# Patient Record
Sex: Female | Born: 1950 | Race: White | Hispanic: No | Smoking: Former smoker
Health system: Southern US, Community
[De-identification: ages and names within clinical notes are randomized; demographics above are authoritative.]

## PROBLEM LIST (undated history)

## (undated) DIAGNOSIS — F411 Generalized anxiety disorder: Secondary | ICD-10-CM

## (undated) DIAGNOSIS — H9190 Unspecified hearing loss, unspecified ear: Secondary | ICD-10-CM

## (undated) DIAGNOSIS — K3184 Gastroparesis: Secondary | ICD-10-CM

## (undated) DIAGNOSIS — M810 Age-related osteoporosis without current pathological fracture: Secondary | ICD-10-CM

## (undated) DIAGNOSIS — K219 Gastro-esophageal reflux disease without esophagitis: Secondary | ICD-10-CM

## (undated) DIAGNOSIS — I4891 Unspecified atrial fibrillation: Secondary | ICD-10-CM

## (undated) DIAGNOSIS — I498 Other specified cardiac arrhythmias: Secondary | ICD-10-CM

## (undated) DIAGNOSIS — I499 Cardiac arrhythmia, unspecified: Secondary | ICD-10-CM

## (undated) DIAGNOSIS — F32A Depression, unspecified: Secondary | ICD-10-CM

## (undated) HISTORY — DX: Unspecified atrial fibrillation: I48.91

## (undated) HISTORY — DX: Gastroparesis: K31.84

## (undated) HISTORY — DX: Depression, unspecified: F32.A

## (undated) HISTORY — DX: Generalized anxiety disorder: F41.1

## (undated) HISTORY — DX: Age-related osteoporosis without current pathological fracture: M81.0

## (undated) HISTORY — DX: Gastro-esophageal reflux disease without esophagitis: K21.9

## (undated) HISTORY — DX: Cardiac arrhythmia, unspecified: I49.9

## (undated) HISTORY — DX: Unspecified hearing loss, unspecified ear: H91.90

## (undated) HISTORY — PX: EYE SURGERY: SHX253

## (undated) HISTORY — PX: TONSILLECTOMY: SUR1361

---

## 1998-04-13 ENCOUNTER — Other Ambulatory Visit: Admission: RE | Admit: 1998-04-13 | Discharge: 1998-04-13 | Payer: Self-pay | Admitting: *Deleted

## 1999-03-24 ENCOUNTER — Other Ambulatory Visit: Admission: RE | Admit: 1999-03-24 | Discharge: 1999-03-24 | Payer: Self-pay | Admitting: Obstetrics & Gynecology

## 1999-11-24 ENCOUNTER — Encounter: Admission: RE | Admit: 1999-11-24 | Discharge: 1999-11-24 | Payer: Self-pay | Admitting: Obstetrics & Gynecology

## 1999-11-24 ENCOUNTER — Encounter: Payer: Self-pay | Admitting: Obstetrics & Gynecology

## 1999-12-03 ENCOUNTER — Encounter: Admission: RE | Admit: 1999-12-03 | Discharge: 1999-12-03 | Payer: Self-pay | Admitting: Obstetrics & Gynecology

## 1999-12-03 ENCOUNTER — Encounter: Payer: Self-pay | Admitting: Obstetrics & Gynecology

## 2000-03-20 ENCOUNTER — Other Ambulatory Visit: Admission: RE | Admit: 2000-03-20 | Discharge: 2000-03-20 | Payer: Self-pay | Admitting: Obstetrics & Gynecology

## 2000-12-05 ENCOUNTER — Encounter: Payer: Self-pay | Admitting: Obstetrics & Gynecology

## 2000-12-05 ENCOUNTER — Encounter: Admission: RE | Admit: 2000-12-05 | Discharge: 2000-12-05 | Payer: Self-pay | Admitting: Obstetrics & Gynecology

## 2001-04-24 ENCOUNTER — Other Ambulatory Visit: Admission: RE | Admit: 2001-04-24 | Discharge: 2001-04-24 | Payer: Self-pay | Admitting: Obstetrics & Gynecology

## 2001-11-14 ENCOUNTER — Encounter: Admission: RE | Admit: 2001-11-14 | Discharge: 2001-11-14 | Payer: Self-pay | Admitting: Obstetrics & Gynecology

## 2001-11-14 ENCOUNTER — Encounter: Payer: Self-pay | Admitting: Obstetrics & Gynecology

## 2002-05-08 ENCOUNTER — Ambulatory Visit (HOSPITAL_COMMUNITY): Admission: RE | Admit: 2002-05-08 | Discharge: 2002-05-08 | Payer: Self-pay | Admitting: Internal Medicine

## 2002-05-20 ENCOUNTER — Other Ambulatory Visit: Admission: RE | Admit: 2002-05-20 | Discharge: 2002-05-20 | Payer: Self-pay | Admitting: Obstetrics & Gynecology

## 2002-11-15 ENCOUNTER — Encounter: Admission: RE | Admit: 2002-11-15 | Discharge: 2002-11-15 | Payer: Self-pay | Admitting: Obstetrics & Gynecology

## 2002-11-15 ENCOUNTER — Encounter: Payer: Self-pay | Admitting: Obstetrics & Gynecology

## 2003-05-21 ENCOUNTER — Other Ambulatory Visit: Admission: RE | Admit: 2003-05-21 | Discharge: 2003-05-21 | Payer: Self-pay | Admitting: Obstetrics & Gynecology

## 2003-11-28 ENCOUNTER — Encounter: Admission: RE | Admit: 2003-11-28 | Discharge: 2003-11-28 | Payer: Self-pay | Admitting: Obstetrics & Gynecology

## 2004-06-28 ENCOUNTER — Other Ambulatory Visit: Admission: RE | Admit: 2004-06-28 | Discharge: 2004-06-28 | Payer: Self-pay | Admitting: Obstetrics & Gynecology

## 2004-11-03 ENCOUNTER — Ambulatory Visit (HOSPITAL_COMMUNITY): Admission: RE | Admit: 2004-11-03 | Discharge: 2004-11-03 | Payer: Self-pay | Admitting: Family Medicine

## 2004-11-29 ENCOUNTER — Ambulatory Visit (HOSPITAL_COMMUNITY): Admission: RE | Admit: 2004-11-29 | Discharge: 2004-11-29 | Payer: Self-pay | Admitting: Obstetrics & Gynecology

## 2005-07-27 ENCOUNTER — Other Ambulatory Visit: Admission: RE | Admit: 2005-07-27 | Discharge: 2005-07-27 | Payer: Self-pay | Admitting: Obstetrics & Gynecology

## 2005-12-01 ENCOUNTER — Ambulatory Visit (HOSPITAL_COMMUNITY): Admission: RE | Admit: 2005-12-01 | Discharge: 2005-12-01 | Payer: Self-pay | Admitting: Obstetrics & Gynecology

## 2005-12-21 ENCOUNTER — Ambulatory Visit: Payer: Self-pay | Admitting: Internal Medicine

## 2006-02-02 ENCOUNTER — Ambulatory Visit: Payer: Self-pay | Admitting: Internal Medicine

## 2006-03-09 ENCOUNTER — Ambulatory Visit: Payer: Self-pay | Admitting: Internal Medicine

## 2006-03-09 ENCOUNTER — Ambulatory Visit (HOSPITAL_COMMUNITY): Admission: RE | Admit: 2006-03-09 | Discharge: 2006-03-09 | Payer: Self-pay | Admitting: Internal Medicine

## 2006-12-04 ENCOUNTER — Ambulatory Visit (HOSPITAL_COMMUNITY): Admission: RE | Admit: 2006-12-04 | Discharge: 2006-12-04 | Payer: Self-pay | Admitting: Obstetrics & Gynecology

## 2007-03-22 ENCOUNTER — Ambulatory Visit: Payer: Self-pay | Admitting: Gastroenterology

## 2007-03-23 ENCOUNTER — Encounter (HOSPITAL_COMMUNITY): Admission: RE | Admit: 2007-03-23 | Discharge: 2007-04-22 | Payer: Self-pay | Admitting: Gastroenterology

## 2007-04-19 DIAGNOSIS — K3184 Gastroparesis: Secondary | ICD-10-CM

## 2007-04-19 HISTORY — DX: Gastroparesis: K31.84

## 2007-04-24 ENCOUNTER — Ambulatory Visit: Payer: Self-pay | Admitting: Gastroenterology

## 2007-05-25 ENCOUNTER — Ambulatory Visit: Payer: Self-pay | Admitting: Gastroenterology

## 2007-10-17 ENCOUNTER — Ambulatory Visit: Payer: Self-pay | Admitting: Gastroenterology

## 2007-12-04 ENCOUNTER — Ambulatory Visit: Payer: Self-pay | Admitting: Gastroenterology

## 2007-12-06 ENCOUNTER — Ambulatory Visit (HOSPITAL_COMMUNITY): Admission: RE | Admit: 2007-12-06 | Discharge: 2007-12-06 | Payer: Self-pay | Admitting: Obstetrics & Gynecology

## 2008-01-15 ENCOUNTER — Ambulatory Visit: Payer: Self-pay | Admitting: Gastroenterology

## 2008-04-01 ENCOUNTER — Ambulatory Visit: Payer: Self-pay | Admitting: Gastroenterology

## 2008-07-29 ENCOUNTER — Ambulatory Visit: Payer: Self-pay | Admitting: Gastroenterology

## 2008-12-08 ENCOUNTER — Ambulatory Visit (HOSPITAL_COMMUNITY): Admission: RE | Admit: 2008-12-08 | Discharge: 2008-12-08 | Payer: Self-pay | Admitting: Obstetrics and Gynecology

## 2009-03-31 DIAGNOSIS — Z8659 Personal history of other mental and behavioral disorders: Secondary | ICD-10-CM | POA: Insufficient documentation

## 2009-03-31 DIAGNOSIS — R12 Heartburn: Secondary | ICD-10-CM | POA: Insufficient documentation

## 2009-03-31 DIAGNOSIS — K117 Disturbances of salivary secretion: Secondary | ICD-10-CM | POA: Insufficient documentation

## 2009-03-31 DIAGNOSIS — K5900 Constipation, unspecified: Secondary | ICD-10-CM | POA: Insufficient documentation

## 2009-03-31 DIAGNOSIS — R1319 Other dysphagia: Secondary | ICD-10-CM | POA: Insufficient documentation

## 2009-03-31 DIAGNOSIS — J029 Acute pharyngitis, unspecified: Secondary | ICD-10-CM | POA: Insufficient documentation

## 2009-03-31 DIAGNOSIS — K219 Gastro-esophageal reflux disease without esophagitis: Secondary | ICD-10-CM | POA: Insufficient documentation

## 2009-03-31 DIAGNOSIS — R110 Nausea: Secondary | ICD-10-CM | POA: Insufficient documentation

## 2009-03-31 DIAGNOSIS — R1013 Epigastric pain: Secondary | ICD-10-CM | POA: Insufficient documentation

## 2009-03-31 DIAGNOSIS — K3184 Gastroparesis: Secondary | ICD-10-CM | POA: Insufficient documentation

## 2009-04-01 ENCOUNTER — Ambulatory Visit: Payer: Self-pay | Admitting: Gastroenterology

## 2009-12-14 ENCOUNTER — Ambulatory Visit (HOSPITAL_COMMUNITY): Admission: RE | Admit: 2009-12-14 | Discharge: 2009-12-14 | Payer: Self-pay | Admitting: Obstetrics and Gynecology

## 2010-03-05 ENCOUNTER — Encounter (INDEPENDENT_AMBULATORY_CARE_PROVIDER_SITE_OTHER): Payer: Self-pay | Admitting: *Deleted

## 2010-12-16 ENCOUNTER — Ambulatory Visit (HOSPITAL_COMMUNITY)
Admission: RE | Admit: 2010-12-16 | Discharge: 2010-12-16 | Payer: Self-pay | Source: Home / Self Care | Attending: Obstetrics and Gynecology | Admitting: Obstetrics and Gynecology

## 2011-01-20 NOTE — Letter (Signed)
Summary: [facility] Appointment Letter  [facility]  [address]   [place], [place] [address]   Phone: [phone]  Fax: [phone]    03/05/2010  Pamela Yang [address] [place], [place]  [address] 03-Oct-1951  Dear [name],   Your physician has indicated that:   _______it is time to schedule an appointment.   _______you missed your appointment on______ and need to call and  reschedule.   _______you need to have lab work done.   _______you need to schedule an appointment to discuss lab or test results.   _______you need to call to reschedule your appointment that was scheduled on _________.   Please call our office at  [phone].    Thank you,    [facility] Ph: [phone]   Fax: [phone]

## 2011-03-30 ENCOUNTER — Other Ambulatory Visit (HOSPITAL_COMMUNITY): Payer: Self-pay | Admitting: Family Medicine

## 2011-03-30 ENCOUNTER — Ambulatory Visit (HOSPITAL_COMMUNITY)
Admission: RE | Admit: 2011-03-30 | Discharge: 2011-03-30 | Disposition: A | Discharge status: Home / Self Care | Payer: BC Managed Care – PPO | Source: Ambulatory Visit | Attending: Family Medicine | Admitting: Family Medicine

## 2011-03-30 DIAGNOSIS — R079 Chest pain, unspecified: Secondary | ICD-10-CM | POA: Insufficient documentation

## 2011-04-08 ENCOUNTER — Ambulatory Visit (HOSPITAL_COMMUNITY): Payer: BC Managed Care – PPO | Attending: Family Medicine

## 2011-04-08 DIAGNOSIS — R079 Chest pain, unspecified: Secondary | ICD-10-CM | POA: Insufficient documentation

## 2011-05-03 NOTE — Assessment & Plan Note (Signed)
NAME:  Pamela Yang, Pamela Yang               CHART#:  [id number]   DATE:                                   DOB:  1951-04-29   REFERRING PHYSICIAN:  Scott A. Gerda Diss, MD.   PROBLEM LIST:  1. Gastroparesis.  2. Irritable bowel syndrome.  3. Screening colonoscopy in May 2003.  4. Occasional alcohol use.  5. History of tobacco use but quit five years ago.   SUBJECTIVE:  Pamela Yang is a 60 year old female, who is seen as a  return patient visit.  She states she is doing pretty good.  She cannot  handle lettuce.  She would like longer prescriptions of Reglan.  She  also complains of constipation, but she has been eating more fiber.  She  has tried MiraLax daily, but it caused bloating.  She is now taking it  once every two to three days.  She occasionally feels acid in the back  of her throat especially if she has some dietary indiscretions.   MEDICATIONS:  1. Aspirin 81 mg daily.  2. Multivitamin.  3. Prilosec 20 mg daily.  4. Reglan 5 mg three times a day.   OBJECTIVE:  Vital signs:  Weight 114 pounds (up 3 pounds since her last  visit), height 5 foot 4 inches, temperature 97.5, blood pressure 120/82.  General:  She is in no apparent distress, alert and oriented x4.  Lungs:  Clear to auscultation bilaterally.Cardiovascular:  Regular rhythm, no  murmur, normal S1 and S2.  Abdomen:  Bowel sounds are present, soft, nontender, and nondistended,  no rebound or guarding.   ASSESSMENT:  Pamela Yang is a 60 year old female, who was diagnosed  with a gastroparesis.  On her last visit, she was started on Reglan.  She is not experiencing any signs or symptoms of side effects from the  medication.  Thank you for allowing me to see Pamela Yang in  consultation.  My recommendations follow.   RECOMMENDATIONS:  1. Pamela Yang seems to be doing better with the liberalization of      her diet.  She should again challenge her stomach with different      substances.  She will be able to have a more  liberal diet through      trial and error.  2. I have given her a prescription for Reglan, one month supply,      signature one p.o. q.a.c.  She is given 11 refills.  3. She may follow up and see me in four months.  If she is feeling      well, she may call and cancel that appointment and reschedule.       Kassie Mends, M.D.  Electronically Signed     SM/MEDQ  D:  05/25/2007  T:  05/25/2007  Job:  [id number]   cc:   Lorin Picket A. Gerda Diss, MD

## 2011-05-03 NOTE — Assessment & Plan Note (Signed)
NAME:  Pamela Yang, Pamela Yang               CHART#:  [id number]   DATE:  10/17/2007                       DOB:  [birth year]   REFERRING PHYSICIAN:  Lilyan Punt.   PROBLEM LIST:  1. Gastroparesis.  2. Irritable bowel syndrome.  3. Screening colonoscopy in May 2003.  4. Occasional alcohol use.  5. History of tobacco abuse but quit 5 years ago.   SUBJECTIVE:  Ms. Pamela Yang is a 60 year old female who presents as a  return patient visit.  She reports that taking Reglan caused her to feel  very depressed and to have suicidal thoughts.  She stopped taking the  medicine all together and now takes it as needed.  She complains of  discomfort in the back of her throat at times.  She has burping.  She  complains of a sharp, annoying pain that comes and goes underneath her  ribcage.  She never feels like anything is quite right.  She does have  good days but she also has bad days.  No particular foods seem to make  her symptoms worse.  She can sometimes tolerate oatmeal and sometimes  not.  The discomfort under her ribcage is sometimes associated with  chest discomfort.  It sometimes correlates with the discomfort in the  back of her throat and sometimes not.  She denies any difficulty  swallowing.  Rarely does she have nausea.  She has no vomiting.  She has  a bowel movement every 2 days.  She rarely takes the MiraLax and she is  not taking any fiber supplements.   MEDICATIONS:  1. Aspirin.  2. Multivitamin.  3. Calcium with vitamin D due to osteopenia.  4. Prilosec 20 mg daily 30 minutes before meals.  5. Vitamin C.  6. Iron.  7. Magnesium.  8. Flax seed.  9. Reglan as needed.   OBJECTIVE:  Weight 115 pounds (up 1 pound since June of 2008).  Height 5  feet 4 inches.  Body mass index 19.7 (slightly underweight).  Temperature 97.8.  Blood pressure 104/76.  Pulse 70.  GENERAL:  She is in no apparent distress.  Alert and oriented x4.  HEENT:  Atraumatic, normocephalic.  Pupils equal and  reactive to light.  Mouth, no oral lesions.  Posterior pharynx without erythema or exudate.  NECK:  Full range of motion.  No lymphadenopathy.  LUNGS:  Clear to auscultation bilaterally.  CARDIOVASCULAR:  Regular rhythm. No murmur.  Normal S1 and S2.  ABDOMEN:  Bowel sounds are present.  Soft, nontender, and nondistended.  NEURO:  She has no focal neurologic deficits.   ASSESSMENT:  Ms. Pamela Yang is a 60 year old female who has a history of  depression and her symptoms of depression were exacerbated by Reglan.  She also complains of symptoms which sound like a combination of  partially controlled gastroesophageal reflux disease and perhaps  irritable bowel.  Thank you for allowing me to see Ms. Pamela Yang in  consultation.  My recommendations follow.   RECOMMENDATIONS:  1. Ms. Pamela Yang is instructed to stop her Reglan.  I do not think she      is an ideal candidate for this drug.  2. She is asked to increase omeprazole to 30 minutes before breakfast      and dinner.  3. We will add erythromycin 200 mg 30 minutes before breakfast and  lunch.  She is to titrate her dose every 10 days to hopefully      achieve a q.a.c. and h.s. dosing.  4. She is also given information on the gastroparesis website:      www.digestivedistress.com.  5. She is to follow up in 6 weeks.       Kassie Mends, M.D.  Electronically Signed     SM/MEDQ  D:  10/17/2007  T:  10/18/2007  Job:  [id number]   cc:   Lorin Picket A. Gerda Diss, MD

## 2011-05-03 NOTE — Assessment & Plan Note (Signed)
NAME:  Pamela Yang, Pamela Yang               CHART#:  [id number]   DATE:  04/01/2008                       DOB:  1951/09/29   REFERRING PHYSICIAN:  Scott A. Gerda Diss, M.D.   PROBLEM LIST:  1. Gastroparesis.  2. Dry mouth and sore throat, possibly secondary to gastroesophageal      reflux disease.  3. Irritable bowel syndrome.  4. Occasional alcohol use.  5. History of tobacco abuse but quit 5 years ago.  6. Colonoscopy in 2003 without polyps.   SUBJECTIVE:  Pamela Yang is a 60 year old female who presents as a  return patient visit.  She states she is doing great!.  She is  following with ear, nose and throat for her dry mouth and was told that  reflux was  affecting her larynx.  She was increased to 40 mg b.i.d. in  February 2009 and has done much better.  She stopped taking the  erythromycin approximately 1 week ago and feels a lot better.  She was  only taking it once a day.   MEDICATIONS:  1. Aspirin 81 mg daily.  2. Multivitamin.  3. Prilosec 40 mg b.i.d.  4. Vitamin C.  5. Iron.  6. Magnesium.  7. Flax seed.  8. Reglan as needed.  9. Celexa 20 mg daily.  10.Phenergan as needed at nighttime.   OBJECTIVE/PHYSICAL EXAMINATION:  VITAL SIGNS:  Weight 111 pounds  (unchanged since May 2008), height 5 feet 4 inches, BMI 19.1  (underweight), temperature 97.5, blood pressure 110/70, pulse 56.  GENERAL:  She is in no apparent distress.  Alert and oriented x 4.  LUNGS:  Clear to auscultation bilaterally.  CARDIOVASCULAR:  Regular  rhythm.  No murmur.  ABDOMEN:  Bowel sounds present.  Soft, nontender, nondistended.   ASSESSMENT:  [name] is a 60 year old female with gastroparesis,  which is fairly well-controlled.  She is also underweight. She has been  seen by ear, nose and throat for dry mouth, and her symptoms have  improved on omeprazole 40 mg twice a day.   Thank you for allowing me to see Ms. Tomasini in consultation.  My  recommendations follow.   RECOMMENDATIONS:  1.  She may continue to use the Reglan as needed.  2. Will defer management of her omeprazole to the ear, nose and throat      physician.  3. Continue Phenergan as needed and the gastroparesis diet.  4. Follow up in 4 months.       Kassie Mends, M.D.  Electronically Signed     SM/MEDQ  D:  04/01/2008  T:  04/01/2008  Job:  [id number]   cc:   Lorin Picket A. Gerda Diss, MD

## 2011-05-03 NOTE — Assessment & Plan Note (Signed)
NAME:  Pamela Yang, Pamela Yang               CHART#:  [id number]   DATE:  07/29/2008                       DOB:  [birth year]   REFERRING PHYSICIAN:  Scott A. Gerda Diss, MD   PROBLEM LIST:  1. Gastroparesis.  2. Dry mouth and sore throat.  3. Irritable bowel syndrome.  4. Occasional alcohol use.  5. History of tobacco abuse, quit 5 years ago.  6. Colonoscopy in 2003 without polyps.   SUBJECTIVE:  The patient is a 60 year old female, who presents to return  patient visit.  She has been doing fairly well.  She has liberalized her  diet.  If she feels full, she takes an erythromycin and makes it better.  She eats small amounts.  Her maximum weight in the last year was 115  pounds.  Her nausea is actually improved.  She may be nauseated 2 times  a week at nighttime, but when she wakes up and eats she is fine.  Her  last dose of Phenergan was a while ago and she likes to avoid it if she  can because it causes drowsiness.  She has used NuLev twice since it was  prescribed.  She denies any heartburn or indigestion and believes that  the higher dose of omeprazole has helped with her throat symptoms.   MEDICATIONS:  1. Aspirin.  2. Multivitamins.  3. Calcium with vitamin D.  4. Prilosec 40 mg b.i.d.  5. Vitamin C.  6. Iron.  7. Magnesium.  8. Flaxseed.  9. Erythromycin as needed.  10.Celexa 20 mg daily.  11.NuLev.  12.Phenergan as needed.   OBJECTIVE:  Physical exam:  VITAL SIGNS:  Weight 113 pounds (up 2 pounds since April 2009), height 5  feet 4 inches, temperature 97.9, blood pressure 100/70, pulse 60, and  BMI 19.4 (slightly underweight).GENERAL:  She is in no apparent  distress.  Alert and oriented x4.  LUNGS:  Clear to auscultation bilaterally.CARDIOVASCULAR:  Regular  rhythm.ABDOMEN:  Bowel sounds are present, soft, nontender, and  nondistended.   ASSESSMENT:  The patient is a 60 year old female, whose gastroparesis  seems to be fairly well controlled.  Her throat symptoms are  fairly well  controlled with a high dose of omeprazole. Thank you for allowing me to  see the patient in consultation.  My recommendations follow.   RECOMMENDATIONS:  1. She may continue to follow her gastroparesis diet and liberalize      her diet as long as it does not cause her to be symptomatic.  It      would be good for her to gain 3 to 5 more pounds.  2. She may continue to use the NuLev and erythromycin as needed.  I      did explain to her that Phenergan can be taken in a dose as low as      6.25 mg, but if she continues to have problems with drowsiness,      then I can prescribe Zofran which is more expensive.  3. She is encouraged to take 2 scoops of protein powder daily.  4. Return patient visit in 6 months.  Will be happy to authorize      refills and if she needs to be seen sooner, would be happy to do      that as well.       Sandi  Cira Servant, M.D.  Electronically Signed     SM/MEDQ  D:  07/29/2008  T:  07/30/2008  Job:  [id number]   cc:   Lorin Picket A. Gerda Diss, MD

## 2011-05-03 NOTE — Assessment & Plan Note (Signed)
NAME:  Pamela Yang, Pamela Yang               CHART#:  [id number]   DATE:  01/15/2008                       DOB:  29-Dec-1950   REFERRING PHYSICIAN:  Scott A. Gerda Diss, MD   PROBLEM LIST:  1. Gastroparesis.  2. Irritable bowel syndrome.  3. Colonoscopy in 2003.  4. Occasional alcohol use.  5. History of tobacco abuse but quit five years ago.   SUBJECTIVE:  Pamela Yang is a 60 year old female who presents as a  return patient visit.  She was last seen in December 2008 and was  complaining of a burning sensation in the mouth and the back of her  throat.  Her husband was concerned about her well-being and diagnosis.  She was asked to use NuLev as needed and to continue her erythromycin.  She has also scheduled an appointment with Dr. Claire Shown.   She still has problems with nausea in the morning.  The nausea resolves  by mid afternoon.  Eating seems to help.  She usually takes her  erythromycin twice daily.  She may take a dose at lunch and then a dose  at supper or before bedtime.  She is going to [facility] to  evaluate her dry mouth.  She has had no vomiting.  Her bowel movements  are regular.  She cancelled her appointment with Dr. Alycia Rossetti until her  issue with her dry mouth is resolved.  The NuLev works for her IBS  symptoms but causes dry mouth.   MEDICATIONS AT HOME:  1. Aspirin.  2. Multivitamin.  3. Prilosec 20 mg twice a day.  4. Iron.  5. Magnesium.  6. Flax seed.  7. Erythromycin 250 mg b.i.d.  8. Celexa 20 mg daily.  9. NuLev as needed.   OBJECTIVE:  VITAL SIGNS:  Weight 114 pounds (up 2 pounds since December  of 2008), height 5 foot, 4 inches, BMI 19.6 (slightly underweight)  temperature 97.8, blood pressure 110/78, pulse 78.GENERAL:  She is in no  apparent distress, alert and oriented x4.LUNGS:  Clear to auscultation  bilaterally.CARDIOVASCULAR EXAM:  Regular rhythm.  ABDOMEN:  Bowel sounds are present, soft, nontender, nondistended.   ASSESSMENT:  Pamela Yang  is a 60 year old female whose symptoms of  gastroparesis seem to be fairly well controlled on erythromycin.  She is  having some early morning nausea.  The NuLev works for her irritable  bowel symptoms. Thank you for allowing me to see Pamela Yang in  consultation.  My recommendations follow.   RECOMMENDATIONS:  1. She may have Phenergan 25 mg 1/2 to 1 p.o. q h.s. to help decrease      or prevent early morning nausea.  2. She may continue the NuLev and erythromycin.  3. She has a follow up appointment to see me in three months.       Kassie Mends, M.D.  Electronically Signed     SM/MEDQ  D:  01/15/2008  T:  01/16/2008  Job:  [id number]   cc:   Lorin Picket A. Gerda Diss, MD

## 2011-05-03 NOTE — Assessment & Plan Note (Signed)
NAME:  Pamela Yang, Pamela Yang               CHART#:  [id number]   DATE:  12/04/2007                       DOB:  [birth year]   REFERRING PHYSICIAN:  Scott A. Gerda Diss, MD   PROBLEM LIST:  1. Gastroparesis.  2. Irritable bowel.  3. Colonoscopy in 2003.  4. Occasional alcohol use.  5. History of tobacco abuse but quit 5 years ago.   SUBJECTIVE:  Pamela Yang is a 60 year old female who presents as a  return patient visit.  She has been seen in our clinic since 2002.  At  that time she was complaining of alternating constipation and diarrhea.  She reported being diagnosed with spastic colon for over 25 years.  She  took NuLev which helped with Pamela Yang symptoms.  Pamela Yang colonoscopy in 2003 only  showed external hemorrhoids.  Pamela Yang terminal ileum was also evaluated and  was normal.  In January of 2007 she began to complain of pain in the  back of Pamela Yang throat.  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  Pamela Yang Prevacid  was stopped and Pamela Yang Zegerid was begun.  She did not received any  significant relief from Zegerid.  In March of 2007 she had an EGD with a  48 hour Bravo study.  Pamela Yang Bravo study showed esophageal acid exposure  within physiologic range.  Only 2/12 of Pamela Yang episodes of chest pain were  associated with acid reflux.  Pamela Yang EGD in 2007 showed a small, sliding  hiatal hernia, otherwise it was normal.   She was seen for the first time by me in April 2008.  At that time she  was complaining of nausea and early satiety.  She weighed 113 pounds.  Pamela Yang gastric emptying study was performed.  It was abnormal.  She states  Reglan caused jitteriness.  She was started on the gastroparesis diet.  On follow up a month later she was 111 pounds.  In spite of Reglan  causing jitteriness, she decided she could use the 5 mg qPamelaaPamelac. and at  bedtime.  Pamela Yang husband is always with Pamela Yang when she is interviewed.  Pamela Yang  gastric emptying study showed 84% gastric retention at 2 hours.  Pamela Yang  fasting glucose, TSH, and cortisol level were  normal. She was seen in  June 2008 and complained of feeling like acid was in the back of Pamela Yang  throat.  She also complained of constipation.  She did not like MiraLax  because it caused bloating.  She was still on Reglan at the time.  In  October 2008 she weighed 115 pounds.  She was complaining that the  Reglan made Pamela Yang feel depressed and have suicidal thoughts.  She was  started on erythromycin 200 mg 30 minutes before breakfast and lunch.  She was also asked to increase Pamela Yang omeprazole to twice a day.  She was  complaining of burping and discomfort in the back of Pamela Yang throat at  times.   She presented today as a return visit and was complaining that sometimes  she has good days and sometimes she does not.  She have 2-3/7 bad days  and sometimes 0/7 bad days.  She feels that burning in Pamela Yang throat,  fullness up in Pamela Yang chest.  Occasionally she has nausea.  She denies any  vomiting.  She states she feels like she just threw up.  Erythromycin  twice a day causes some cramping.  It does help with Pamela Yang gastroparesis  symptoms.  She denies any diarrhea.  Pamela Yang symptoms wax and wane over a 3-  4 period of time.  She says Pamela Yang tongue feels numb.  She told Pamela Yang dentist  and he changed Pamela Yang toothpaste and that did not help.  She says Pamela Yang mouth  feels dry. She does not have any problems chewing Pamela Yang food.  She chews  it very thoroughly and feels like she has adequate saliva.  Often she  uses milkshakes.  Pamela Yang husband is concerned that she has to blend things  up.  She complains of pain in Pamela Yang abdomen which she attributes to Pamela Yang  irritable bowel.   ALLERGIES:  NEW KNOWN DRUG ALLERGIES.   MEDICATIONS:  1. Aspirin.  2. Multivitamin.  3. Calcium with vitamin D.  4. Prilosec twice a day.  5. Vitamin C.  6. Iron.  7. Magnesium.  8. Flax seed oil.  9. Erythromycin 250 mg twice a day.   OBJECTIVE:  Weight 112 pounds.  Height 5 feet 4 inches.  Temperature  97Pamela7.,  Blood pressure 100/70.  Pulse 16.   GENERAL:  She is in no apparent distress, alert and oriented x4.  NECK:  Has full range of motion.  No lymphadenopathy.  LUNGS:  Clear to auscultation bilaterally.  CARDIOVASCULAR:  Regular rhythm.  No murmur.  Normal S1 and S2.  ABDOMEN:  Bowel sounds present, soft.  Mild tenderness to palpation in  all 4 quadrants without rebound or guarding.   ASSESSMENT:  Pamela Yang is a 60 year old female who has a diagnosis of  gastroparesis.  She also carries the diagnosis of irritable bowel  syndrome.  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  She has had recent upper endoscopy and  I see no indication to repeat it.  She has already had a 48 hour acid  exposure study within the last 2 years.  She seemed to be a little  frustrated with Pamela Yang diagnosis.  Pamela Yang husband does too.   Thank you for allowing me to see Pamela Yang,. Yang in consultation.  My  recommendations follow.   RECOMMENDATIONS:  1. I asked Pamela Yang to see Dr. Gerda Diss for Pamela Yang symptoms of a dry mouth.  It      is conceivable that she could have a connective tissue disorder,      which is causing all of Pamela Yang functional gut problems.  2. She wanted to try NuLev to see if it would help with Pamela Yang abdominal      pain.  She was given a prescription and asked to use 1-2 30 minutes      before meals and she may repeat that every 4-6 hours.  She may have      no more than 8 tablets a day.  3. She is going to continue the erythromycin at 30 minutes before      breakfast and lunch.  4. She is given a referral to see Dr. Claire Shown, a gastroparesis expert      in 8 weeks if possible.  She currently declines a referral to a      tertiary care center, but the appointment will be made and if the      current therapy is not satisfactory, she may see Dr. Alycia Rossetti for a      second opinion.  5. She has a follow up  appointment to see me in 6 weeks.  6. She is asked to continue Pamela Yang omeprazole and she may use  [employer]      Instant Breakfast to help meet Pamela Yang caloric and nutritional needs.       Kassie Mends, MPamelaD.  Electronically Signed     SM/MEDQ  D:  12/04/2007  T:  12/05/2007  Job:  [id number]   cc:   Lorin Picket A. Gerda Diss, MD

## 2011-05-06 NOTE — Op Note (Signed)
NAME:  Pamela Yang, Pamela Yang              ACCOUNT NO.:  [id number]   MEDICAL RECORD NO.:  [id number]          PATIENT TYPE:  AMB   LOCATION:  DAY                           FACILITY:  [facility]   PHYSICIAN:  Lionel December, M.D.    DATE OF BIRTH:  [birth year]   DATE OF PROCEDURE:  03/09/2006  DATE OF DISCHARGE:                                 OPERATIVE REPORT   PROCEDURE:  Esophagogastroduodenoscopy with placement of a Bravo device for  pH study.   INDICATIONS:  Pamela Yang is a 60 year old Caucasian female who was felt to have  atypical symptoms of GERD. She has had retrosternal pain and throat  symptoms. She has had the need to clear her throat frequently, and the  sensation of lump in her throat. She has been treated with 2 different PPIs,  but without symptomatic improvement. She was also given Reglan along with  these meds, but without much change. She is, therefore, undergoing EGD. If  her esophagus is normal, a Bravo device will be placed. She has been off  Prilosec for 7 days; and she states she has actually done the best she has  in a while.   Procedure and risks were reviewed with the patient and informed consent was  obtained.   MEDS FOR CONSCIOUS SEDATION:  Benzocaine spray for oropharyngeal topical  anesthesia, Demerol 25 mg IV Versed 4 mg IV.   FINDINGS:  Procedure performed in endoscopy suite. The patient's vital signs  and O2 saturations were monitored during the procedure and remained stable.  The patient was placed in the left lateral position, and Olympus videoscope  was passed via oropharynx with slight difficulty into esophagus.   ESOPHAGUS:  Mucosa of the esophagus was normal. GE junction was at 39 and  hiatus was at 41 cm. She had small sliding hiatal hernia.   STOMACH:  It was empty and distended very well with insufflation. Folds of  the proximal stomach were normal. Examination of mucosa at body, antrum,  pyloric channel, as well as angularis, fundus, and cardia was  normal.   DUODENUM:  Bulbar mucosa was normal. Scope was advanced into the second part  of the duodenum where mucosa and folds were normal. Endoscope was withdrawn.  Bravo device was already loaded on the delivery system. It was calibrated.  It was advanced blindly via oropharynx to 33 cm from the incisors. It was  connected to suction apparatus for 30 seconds. The plunger was pushed to  secure the Bravo device to esophageal mucosa. It was then turned clockwise.  The delivery catheter was removed. Endoscope was passed, again, and device  was in good position. Pictures taken for the record. Endoscope was  withdrawn. The patient tolerated the procedure well.   FINAL DIAGNOSIS:  1.  Small sliding hiatal hernia, otherwise normal      esophagogastroduodenoscopy.  2.  Bravo device placed at distal esophagus (6 cm proximal to GE junction)      for a 48-hour pH study. If the patient does not have much in the way of      symptoms in 48 hours, we will  extend this study to another 48-hour      period.      Lionel December, M.D.  Electronically Signed     NR/MEDQ  D:  03/09/2006  T:  03/10/2006  Job:  [id number]   cc:   Lorin Picket A. Gerda Diss, MD  Fax: [phone]

## 2011-05-06 NOTE — H&P (Signed)
NAME:  Pamela Yang, SUBLETTE              ACCOUNT NO.:  [id number]   MEDICAL RECORD NO.:  [id number]           PATIENT TYPE:   LOCATION:                                 FACILITY:   PHYSICIAN:  Lionel December, M.D.         DATE OF BIRTH:   DATE OF ADMISSION:  02/02/2006  DATE OF DISCHARGE:  LH                                HISTORY & PHYSICAL   CHIEF COMPLAINT:  Follow up of throat discomfort, lump in throat.   HISTORY OF PRESENT ILLNESS:  Pamela Yang is here for a follow up.  She was seen on  December 21, 2005.  She has a history of atypical gastroesophageal reflux  disease.  She has had retrosternal chest pain, felt to be due to reflux.  In  addition, she complains of need to clear her throat frequently, and the  sensation of a lump in her throat.  She was switched from Prilosec to  Zegerid recently.  This did not make any difference.  She then was asked to  take Prilosec twice a day, along with Reglan.  She says this has not changed  her symptoms, as well.  She continues to feel like a lump is in her throat.  Over the last few days, she has had trouble swallowing her vitamin.  She has  had a couple episodes of chest discomfort.  She denies any typical heart  rate.  She has some indigestion and belching.  She complains of a gnawing  sensation her stomach.  The gallbladder remains in situ.  She has had 2  older sisters who have had their gallbladder removed.  She is noting  restlessness on Reglan.  She tried to drop the dose to twice a day, and  eventually to once a day, and she still has not been able to tolerate the  medication.   CURRENT MEDICATIONS:  1.  Aspirin 81 mg daily.  2.  Multivitamin daily.  3.  Calcium with vitamin D 1200 mg daily.  4.  Prempro 0.45/1.5 mg daily.  5.  Prilosec 20 mg b.i.d.  6.  Iron 50 mg daily.  7.  Magnesium 500 mg daily.  8.  Flax seed oil 2000 mg daily.  9.  Reglan 10 mg q.a.c. and q.h.s. (is actually taking only once a day at      this point).   ALLERGIES:  No known drug allergies.   PAST MEDICAL HISTORY:  1.  Gastroesophageal reflux disease of 14 months' duration.  Presented with      atypical chest pain.  Had a normal EKG.  2.  Irritable bowel syndrome remains in remission.  3.  History of tonsillectomy at age 33.  63.  Screening colonoscopy in May of 2003 which was normal, except for      external hemorrhoids.   FAMILY HISTORY:  Mother died of an MI at age 51.  Father is 41 and doing  fairly well.  She has 3 sisters and a brother.  Two sisters have had their  gallbladder removed.  Two sisters and a brother are hypertensive.  SOCIAL HISTORY:  She is married.  She has 1 child.  One daughter died of an  accident at age 10.  She works at [employer].  She smoked  for 4 years, but quit 35 years ago.  She drinks alcohol occasionally.   REVIEW OF SYSTEMS:  See HPI for GI.  CONSTITUTIONAL:  No weight loss.  See  HPI for CARDIOPULMONARY.  In addition, no shortness of breath or  palpitations.   PHYSICAL EXAMINATION:  VITAL SIGNS:  Weight 118 and stable.  Height 5 feet,  4 inches.  Temperature 97.8, blood pressure 106/60, pulse 56.  GENERAL:  A pleasant, thin Caucasian female in no acute distress.  SKIN:  Warm and dry, no jaundice.  HEENT:  Sclerae are nonicteric.  Oropharyngeal mucosa are normal.  CARDIAC:  Regular rate and rhythm.  Normal S1 and S2.  No murmurs, rubs, or  gallops.  LUNGS:  Clear to auscultation.  ABDOMEN:  Flat, soft, nontender.  No organomegaly or masses.  No rebound  tenderness or guarding.  No abdominal bruits or hernias.  EXTREMITIES:  No edema.   IMPRESSION:  Pamela Yang continues to have persistent throat symptoms that are  felt to be indicative of atypical manifestation of gastroesophageal reflux  disease.  Typical reflux symptoms are well-controlled on proton pump  inhibitor therapy.  She has failed double dose Prilosec and Zegerid.  I feel  at this point, given that she is having some  dysphagia, and we have not been  able to control her symptoms, that she needs to have endoscopic evaluation.   PLAN:  1.  Anti-reflux measures.  Pamphlet provided.  2.  Continue Prilosec 20 mg b.i.d. for now.  3.  Discontinue Reglan.  4.  EGD with possible Bravo pH probe placement based on findings.  Will have      her stop her Prilosec 7 days before the procedure.  5.  Further recommendations to follow.      Tana Coast, P.A.      Lionel December, M.D.  Electronically Signed    LL/MEDQ  D:  02/02/2006  T:  02/02/2006  Job:  [id number]

## 2011-05-06 NOTE — Op Note (Signed)
NAMEBRIENNE, Pamela Yang              ACCOUNT NO.:  [id number]   MEDICAL RECORD NO.:  [id number]          PATIENT TYPE:  AMB   LOCATION:  DAY                           FACILITY:  [facility]   PHYSICIAN:  Lionel December, M.D.    DATE OF BIRTH:  11/04/1951   DATE OF PROCEDURE:  03/09/2006  DATE OF DISCHARGE:  03/09/2006                                 OPERATIVE REPORT   PROCEDURE:  Esophageal pH study report (a 48-hour study with the Bravo  device).   INDICATIONS:  [name] is a 60 year old Caucasian female with atypical symptoms  of GERD.  She has had retrosternal pain and throat symptoms, need to clear  her throat.  She has been tried on two different PPIs as well as a  promotility agent without symptomatic improvement.  She had EGD two days ago  revealing a small sliding hiatal hernia, otherwise normal exam.  She is  undergoing this study off therapy to find out whether not she is having GE  reflux.   FINDINGS:  Day 1 analysis:  Number of reflux episodes was 10, number of  reflux episodes longer than five minutes was 0.   Total time pH below 4 was two minutes.  Fraction time pH below 4 was 0.2%.   Day two analysis:  Number of reflux episodes was 22, number of reflux  episodes greater than five minutes is one.  Longest reflux episodes were 10  minutes.   Time pH less than 4 is 26 minutes.  Fraction time pH below 4 is 1.9%.   Combined to 2-day analysis:  Number of reflux episodes in the study period  was 32, the number of reflux episodes greater than five minutes as one.  Longest reflux episode was 10 minutes.  Total time pH less than 4 was 28  minutes and fraction time pH less than 4 is 1%.   Symptom diary:  The patient reported one episode of heartburn, but there was  no acid documented in her esophagus.   She reported 12 episodes of chest pain, and acid was documented during two  of these episodes with a symptom correlation of less than 20%.  Acid was  documented during two of these  episodes with symptom correlation of 16%.   She reported 40 episodes of regurgitation, and acid was recorded in 10 of  these episodes with a symptom correlation of 25%.   IMPRESSION:  [name] has esophageal acid exposure, which is within physiologic  range.   Only two out of 12 episodes of chest pain were associated with acid reflux  with symptom correlation of 16%, and she had 25% episodes of regurgitation  associated with acid reflux.   RECOMMENDATIONS:  The patient advised to use p.c. Gaviscon.  She will call  us with progress report in 1 month.      Lionel December, M.D.  Electronically Signed     NR/MEDQ  D:  03/19/2006  T:  03/20/2006  Job:  [id number]   cc:   Lorin Picket A. Gerda Diss, MD  Fax: [phone]

## 2011-05-20 HISTORY — PX: COLONOSCOPY: SHX174

## 2011-06-16 ENCOUNTER — Encounter (HOSPITAL_BASED_OUTPATIENT_CLINIC_OR_DEPARTMENT_OTHER): Payer: BC Managed Care – PPO | Admitting: Internal Medicine

## 2011-06-16 ENCOUNTER — Ambulatory Visit (HOSPITAL_COMMUNITY)
Admission: RE | Admit: 2011-06-16 | Discharge: 2011-06-16 | Disposition: A | Discharge status: Home / Self Care | Payer: BC Managed Care – PPO | Source: Ambulatory Visit | Attending: Internal Medicine | Admitting: Internal Medicine

## 2011-06-16 DIAGNOSIS — Z1211 Encounter for screening for malignant neoplasm of colon: Secondary | ICD-10-CM

## 2011-06-16 DIAGNOSIS — K644 Residual hemorrhoidal skin tags: Secondary | ICD-10-CM | POA: Insufficient documentation

## 2011-06-16 DIAGNOSIS — K5730 Diverticulosis of large intestine without perforation or abscess without bleeding: Secondary | ICD-10-CM | POA: Insufficient documentation

## 2011-06-23 ENCOUNTER — Other Ambulatory Visit: Payer: Self-pay | Admitting: Obstetrics and Gynecology

## 2011-06-28 NOTE — Op Note (Signed)
  NAME:  [name], [name]              ACCOUNT NO.:  [id number]  MEDICAL RECORD NO.:  [id number]  LOCATION:  DAYP                          FACILITY:  [facility]  PHYSICIAN:  Lionel December, M.D.    DATE OF BIRTH:  03/11/1951  DATE OF PROCEDURE:  06/16/2011 DATE OF DISCHARGE:                              OPERATIVE REPORT   PROCEDURE:  Colonoscopy.  INDICATIONS:  Pamela Yang is a 60 year old Caucasian female who is undergoing average risk screening colonoscopy.  Procedure risks were reviewed with the patient and informed consent was obtained.  MEDICATIONS FOR CONSCIOUS SEDATION:  Demerol 25 mg IV, Versed 2 mg IV.  FINDINGS:  Procedure performed in endoscopy suite.  The patient's vital signs and O2 saturations were monitored during the procedure and remained stable.  The patient was placed in left lateral recumbent position.  Rectal examination was performed.  No abnormality noted on external or digital exam.  Pentax videoscope was placed in the rectum and advanced under vision into sigmoid colon and beyond.  Preparation was excellent.  Somewhat redundant colon with excellent prep.  There are few small diverticula at sigmoid and transverse colon.  Scope was passed into cecum which was identified by ileocecal valve and appendiceal orifice.  Pictures were taken for the record.  As the scope was withdrawn, colonic mucosa was carefully examined and was normal.  Rectal mucosa similarly was normal.  Scope was retroflexed to examine anorectal junction and small hemorrhoids noted below the dentate line.  Endoscope was then straightened and withdrawn.  Withdrawal time was 8 minutes. The patient tolerated the procedure well.  FINAL DIAGNOSES: 1. Examination performed to cecum. 2. Few small diverticula at sigmoid and transverse colon and external     hemorrhoids, otherwise normal examination.  RECOMMENDATIONS: 1. Standard instructions given. 2. Yearly Hemoccults. 3. Consider next screening exam in 10  years.          ______________________________ Lionel December, M.D.     NR/MEDQ  D:  06/16/2011  T:  06/16/2011  Job:  [id number]  cc:   Lorin Picket A. Gerda Diss, MD Fax: [phone]  Electronically Signed by Lionel December M.D. on 06/28/2011 09:56:17 PM

## 2011-07-05 ENCOUNTER — Ambulatory Visit
Admission: RE | Admit: 2011-07-05 | Discharge: 2011-07-05 | Disposition: A | Discharge status: Home / Self Care | Payer: BC Managed Care – PPO | Source: Ambulatory Visit | Attending: Obstetrics and Gynecology | Admitting: Obstetrics and Gynecology

## 2011-12-01 ENCOUNTER — Other Ambulatory Visit (HOSPITAL_COMMUNITY): Payer: Self-pay | Admitting: Obstetrics and Gynecology

## 2011-12-01 DIAGNOSIS — Z139 Encounter for screening, unspecified: Secondary | ICD-10-CM

## 2011-12-29 ENCOUNTER — Ambulatory Visit (HOSPITAL_COMMUNITY): Payer: BC Managed Care – PPO

## 2012-01-05 ENCOUNTER — Ambulatory Visit (HOSPITAL_COMMUNITY)
Admission: RE | Admit: 2012-01-05 | Discharge: 2012-01-05 | Disposition: A | Discharge status: Home / Self Care | Payer: BC Managed Care – PPO | Source: Ambulatory Visit | Attending: Obstetrics and Gynecology | Admitting: Obstetrics and Gynecology

## 2012-01-05 DIAGNOSIS — Z139 Encounter for screening, unspecified: Secondary | ICD-10-CM

## 2012-01-05 DIAGNOSIS — Z1231 Encounter for screening mammogram for malignant neoplasm of breast: Secondary | ICD-10-CM | POA: Insufficient documentation

## 2012-12-24 ENCOUNTER — Other Ambulatory Visit (HOSPITAL_COMMUNITY): Payer: Self-pay | Admitting: Obstetrics and Gynecology

## 2012-12-24 DIAGNOSIS — Z139 Encounter for screening, unspecified: Secondary | ICD-10-CM

## 2013-01-07 ENCOUNTER — Ambulatory Visit (HOSPITAL_COMMUNITY)
Admission: RE | Admit: 2013-01-07 | Discharge: 2013-01-07 | Disposition: A | Discharge status: Home / Self Care | Payer: BC Managed Care – PPO | Source: Ambulatory Visit | Attending: Obstetrics and Gynecology | Admitting: Obstetrics and Gynecology

## 2013-01-07 DIAGNOSIS — Z139 Encounter for screening, unspecified: Secondary | ICD-10-CM

## 2013-01-07 DIAGNOSIS — Z1231 Encounter for screening mammogram for malignant neoplasm of breast: Secondary | ICD-10-CM | POA: Insufficient documentation

## 2013-05-20 ENCOUNTER — Encounter: Payer: Self-pay | Admitting: *Deleted

## 2013-05-21 ENCOUNTER — Ambulatory Visit (INDEPENDENT_AMBULATORY_CARE_PROVIDER_SITE_OTHER): Payer: BC Managed Care – PPO | Admitting: Family Medicine

## 2013-05-21 ENCOUNTER — Encounter: Payer: Self-pay | Admitting: Family Medicine

## 2013-05-21 VITALS — BP 106/72 | HR 80 | Ht 64.0 in | Wt 112.0 lb

## 2013-05-21 DIAGNOSIS — Z8659 Personal history of other mental and behavioral disorders: Secondary | ICD-10-CM

## 2013-05-21 DIAGNOSIS — R5381 Other malaise: Secondary | ICD-10-CM

## 2013-05-21 DIAGNOSIS — Z0000 Encounter for general adult medical examination without abnormal findings: Secondary | ICD-10-CM

## 2013-05-21 MED ORDER — CITALOPRAM HYDROBROMIDE 20 MG PO TABS: 20.0000 mg | ORAL_TABLET | Freq: Every day | ORAL | Status: DC

## 2013-05-21 NOTE — Progress Notes (Signed)
  Subjective:    Patient ID: Pamela Yang, female    DOB: 26-Mar-1951, 62 y.o.   MRN: [id number]  Anxiety Presents for follow-up visit. Symptoms occur rarely. The severity of symptoms is mild. The quality of sleep is good. Nighttime awakenings: none.   Compliance with medications is 76-100%.   PMH benign social doesn't smoke   Review of Systems     Objective:   Physical Exam Physical exam was not completed today patient was talked to at length regarding osteoporosis as well as anxiety issues and use of Celexa.       Assessment & Plan:  Generalized anxiety-doing well on Celexa continue that she takes half tablet daily I like to see her back in one years time she will followup worker her gynecologist for regular health checkups followup here if any problems. Flu vaccine recommended in the fall.

## 2013-05-23 LAB — CBC WITH DIFFERENTIAL/PLATELET
Basophils Absolute: 0 10*3/uL (ref 0.0–0.1)
Basophils Relative: 1 % (ref 0–1)
Eosinophils Absolute: 0.2 10*3/uL (ref 0.0–0.7)
Eosinophils Relative: 4 % (ref 0–5)
HCT: 36.3 % (ref 36.0–46.0)
Hemoglobin: 11.9 g/dL — ABNORMAL LOW (ref 12.0–15.0)
MCH: 26.6 pg (ref 26.0–34.0)
MCHC: 32.8 g/dL (ref 30.0–36.0)
Monocytes Absolute: 0.4 10*3/uL (ref 0.1–1.0)
Monocytes Relative: 10 % (ref 3–12)
Neutro Abs: 1.8 10*3/uL (ref 1.7–7.7)
Neutrophils Relative %: 40 % — ABNORMAL LOW (ref 43–77)
Platelets: 144 10*3/uL — ABNORMAL LOW (ref 150–400)
RDW: 15.6 % — ABNORMAL HIGH (ref 11.5–15.5)
WBC: 4.4 10*3/uL (ref 4.0–10.5)

## 2013-05-23 LAB — LIPID PANEL
HDL: 62 mg/dL (ref >39)
LDL Cholesterol: 119 mg/dL — ABNORMAL HIGH (ref 0–99)
Total CHOL/HDL Ratio: 3.1 Ratio
Triglycerides: 57 mg/dL (ref <150)
VLDL: 11 mg/dL (ref 0–40)

## 2013-05-23 LAB — TSH: TSH: 2.139 u[IU]/mL (ref 0.350–4.500)

## 2013-05-23 LAB — BASIC METABOLIC PANEL
BUN: 18 mg/dL (ref 6–23)
Calcium: 9.8 mg/dL (ref 8.4–10.5)
Chloride: 106 mEq/L (ref 96–112)
Creat: 0.79 mg/dL (ref 0.50–1.10)
Glucose, Bld: 86 mg/dL (ref 70–99)
Potassium: 4.6 mEq/L (ref 3.5–5.3)
Sodium: 142 mEq/L (ref 135–145)

## 2013-05-31 NOTE — Addendum Note (Signed)
Addended by: Margaretha Sheffield on: 05/31/2013 10:48 AM   Modules accepted: Orders

## 2013-06-11 LAB — IRON AND TIBC
%SAT: 21 % (ref 20–55)
Iron: 69 ug/dL (ref 42–145)
TIBC: 335 ug/dL (ref 250–470)
UIBC: 266 ug/dL (ref 125–400)

## 2013-06-12 LAB — FERRITIN: Ferritin: 20 ng/mL (ref 10–291)

## 2013-06-14 ENCOUNTER — Other Ambulatory Visit: Payer: Self-pay | Admitting: Obstetrics and Gynecology

## 2013-06-14 DIAGNOSIS — E2839 Other primary ovarian failure: Secondary | ICD-10-CM

## 2013-06-14 DIAGNOSIS — M810 Age-related osteoporosis without current pathological fracture: Secondary | ICD-10-CM

## 2013-06-18 ENCOUNTER — Other Ambulatory Visit (INDEPENDENT_AMBULATORY_CARE_PROVIDER_SITE_OTHER): Payer: BC Managed Care – PPO | Admitting: *Deleted

## 2013-06-18 DIAGNOSIS — R5383 Other fatigue: Secondary | ICD-10-CM

## 2013-06-18 DIAGNOSIS — R5381 Other malaise: Secondary | ICD-10-CM

## 2013-06-18 LAB — POC HEMOCCULT BLD/STL (HOME/3-CARD/SCREEN)
Card #2 Fecal Occult Blod, POC: NEGATIVE
Card #3 Fecal Occult Blood, POC: NEGATIVE
Fecal Occult Blood, POC: NEGATIVE

## 2013-07-12 ENCOUNTER — Ambulatory Visit
Admission: RE | Admit: 2013-07-12 | Discharge: 2013-07-12 | Disposition: A | Discharge status: Home / Self Care | Payer: BC Managed Care – PPO | Source: Ambulatory Visit | Attending: Obstetrics and Gynecology | Admitting: Obstetrics and Gynecology

## 2013-07-12 DIAGNOSIS — M810 Age-related osteoporosis without current pathological fracture: Secondary | ICD-10-CM

## 2013-07-12 DIAGNOSIS — E2839 Other primary ovarian failure: Secondary | ICD-10-CM

## 2013-10-24 ENCOUNTER — Other Ambulatory Visit: Payer: Self-pay

## 2013-12-03 ENCOUNTER — Other Ambulatory Visit: Payer: Self-pay

## 2013-12-03 DIAGNOSIS — Z1231 Encounter for screening mammogram for malignant neoplasm of breast: Secondary | ICD-10-CM

## 2014-01-09 ENCOUNTER — Ambulatory Visit
Admission: RE | Admit: 2014-01-09 | Discharge: 2014-01-09 | Disposition: A | Discharge status: Home / Self Care | Payer: BC Managed Care – PPO | Source: Ambulatory Visit

## 2014-01-09 DIAGNOSIS — Z1231 Encounter for screening mammogram for malignant neoplasm of breast: Secondary | ICD-10-CM

## 2014-07-24 ENCOUNTER — Telehealth: Payer: Self-pay | Admitting: Family Medicine

## 2014-07-24 MED ORDER — ALENDRONATE SODIUM 70 MG PO TABS: 70.0000 mg | ORAL_TABLET | ORAL | Status: DC

## 2014-07-24 NOTE — Telephone Encounter (Signed)
Rx sent electronically to pharmacy. Patient notified. 

## 2014-07-24 NOTE — Telephone Encounter (Signed)
Pt states that at a visit with her husband here you said that  You could take over her prescription for Fosamax   She can't get in to see you till the 26th, but will be out of this med Before then. (on the 16th) Can you go ahead an fill it for her till she gets in here to  See you on the 26th?   Fosamax (generic form please) 70 mg 1x weekly  If you can order in a 90 day supply that would be great   [facility]

## 2014-07-24 NOTE — Telephone Encounter (Signed)
#  12 ,1 per week, 3 refills- Fosamax 70 mg

## 2014-07-26 ENCOUNTER — Other Ambulatory Visit: Payer: Self-pay | Admitting: Family Medicine

## 2014-08-13 ENCOUNTER — Ambulatory Visit (INDEPENDENT_AMBULATORY_CARE_PROVIDER_SITE_OTHER): Payer: MEDICARE | Admitting: Family Medicine

## 2014-08-13 ENCOUNTER — Encounter: Payer: Self-pay | Admitting: Family Medicine

## 2014-08-13 VITALS — BP 102/64 | Temp 98.1°F | Ht 64.0 in | Wt 109.0 lb

## 2014-08-13 DIAGNOSIS — M810 Age-related osteoporosis without current pathological fracture: Secondary | ICD-10-CM

## 2014-08-13 DIAGNOSIS — R300 Dysuria: Secondary | ICD-10-CM

## 2014-08-13 DIAGNOSIS — Z0000 Encounter for general adult medical examination without abnormal findings: Secondary | ICD-10-CM

## 2014-08-13 LAB — BASIC METABOLIC PANEL
BUN: 14 mg/dL (ref 6–23)
CALCIUM: 9.7 mg/dL (ref 8.4–10.5)
CO2: 28 mEq/L (ref 19–32)
Chloride: 104 mEq/L (ref 96–112)
Creat: 0.86 mg/dL (ref 0.50–1.10)
GLUCOSE: 91 mg/dL (ref 70–99)
POTASSIUM: 4 meq/L (ref 3.5–5.3)
Sodium: 140 mEq/L (ref 135–145)

## 2014-08-13 LAB — POCT URINALYSIS DIPSTICK
Spec Grav, UA: 1.02
pH, UA: 5

## 2014-08-13 LAB — LIPID PANEL
CHOL/HDL RATIO: 3.7 ratio
Cholesterol: 235 mg/dL — ABNORMAL HIGH (ref 0–200)
HDL: 64 mg/dL (ref >39)
LDL CALC: 155 mg/dL — AB (ref 0–99)
Triglycerides: 79 mg/dL (ref <150)
VLDL: 16 mg/dL (ref 0–40)

## 2014-08-13 MED ORDER — CIPROFLOXACIN HCL 250 MG PO TABS: 250.0000 mg | ORAL_TABLET | Freq: Two times a day (BID) | ORAL | Status: DC

## 2014-08-13 NOTE — Progress Notes (Signed)
   Subjective:    Patient ID: Pamela Yang, female    DOB: 02-Jun-1951, 63 y.o.   MRN: [id number]  HPIMed check up.  Past history osteoporosis she does try to keep healthy she exercises on last bone density 2000 and 4T  He has had problems with LDL being slightly up in the past but her HDL is the good  She denies any chest pain shortness breath nausea vomiting diarrhea or bloody stools she is up-to-date on colonoscopy. Dysuria for the past 2 - 3 days.   Review of Systems     Objective:   Physical Exam Lungs are clear heart is regular pulses normal abdomen soft extremities no edema skin warm dry       Assessment & Plan:  #1 osteoporosis she will need bone density test done again next year continue current medications also continue calcium and vitamin D healthy diet and weight bearing exercise recommend check vitamin D level  #2 reflux stable she uses medication 3 times a week keeps it under control  #3 mild UTI Cipro twice daily 5 days  #4 in the past her LDL still slightly check lipid profile checked in about 7 Wellness exam with nurse practitioner she will be due early next summer 25 minutes the patient

## 2014-08-14 LAB — VITAMIN D 25 HYDROXY (VIT D DEFICIENCY, FRACTURES): VIT D 25 HYDROXY: 81 ng/mL (ref 30–89)

## 2014-08-15 LAB — URINE CULTURE
Colony Count: NO GROWTH
Organism ID, Bacteria: NO GROWTH

## 2014-12-16 ENCOUNTER — Other Ambulatory Visit: Payer: Self-pay

## 2014-12-16 DIAGNOSIS — Z1231 Encounter for screening mammogram for malignant neoplasm of breast: Secondary | ICD-10-CM

## 2014-12-23 ENCOUNTER — Other Ambulatory Visit: Payer: Self-pay | Admitting: *Deleted

## 2014-12-23 ENCOUNTER — Telehealth: Payer: Self-pay | Admitting: Family Medicine

## 2014-12-23 DIAGNOSIS — E7800 Pure hypercholesterolemia, unspecified: Secondary | ICD-10-CM

## 2014-12-23 NOTE — Telephone Encounter (Signed)
Placed repeat BW order for lipid profile per Dr Bary Leriche August visit d/t high LDL. Pt notified and scheduled f/u OV.

## 2014-12-23 NOTE — Telephone Encounter (Signed)
Patient said that when she was seen in August that she was told to come back sometime to have her blood work checked again. She wants to know does she need to make an appointment with our office or just have the BW done?

## 2014-12-24 LAB — LIPID PANEL
CHOLESTEROL: 209 mg/dL — AB (ref 0–200)
HDL: 73 mg/dL (ref >39)
LDL CALC: 124 mg/dL — AB (ref 0–99)
Total CHOL/HDL Ratio: 2.9 Ratio
Triglycerides: 62 mg/dL (ref <150)
VLDL: 12 mg/dL (ref 0–40)

## 2014-12-31 ENCOUNTER — Encounter: Payer: Self-pay | Admitting: Family Medicine

## 2014-12-31 ENCOUNTER — Ambulatory Visit (INDEPENDENT_AMBULATORY_CARE_PROVIDER_SITE_OTHER): Payer: Medicare Other | Admitting: Family Medicine

## 2014-12-31 VITALS — BP 112/70 | Ht 64.0 in | Wt 109.0 lb

## 2014-12-31 DIAGNOSIS — M810 Age-related osteoporosis without current pathological fracture: Secondary | ICD-10-CM

## 2014-12-31 DIAGNOSIS — E785 Hyperlipidemia, unspecified: Secondary | ICD-10-CM | POA: Insufficient documentation

## 2014-12-31 NOTE — Progress Notes (Signed)
   Subjective:    Patient ID: Pamela Yang, female    DOB: 03-27-1951, 63 y.o.   MRN: [id number]  Hyperlipidemia This is a chronic problem. The current episode started more than 1 year ago. Recent lipid tests were reviewed and are high. She is currently on no antihyperlipidemic treatment.    She is try to watch diet try knee properly taking her supplements as well  Review of Systems  Constitutional: Negative for activity change, appetite change and fatigue.  Endocrine: Negative for polydipsia and polyphagia.  Genitourinary: Negative for frequency.  Neurological: Negative for weakness.  Psychiatric/Behavioral: Negative for confusion.       Objective:   Physical Exam  Constitutional: She appears well-nourished. No distress.  Cardiovascular: Normal rate, regular rhythm and normal heart sounds.   No murmur heard. Pulmonary/Chest: Effort normal and breath sounds normal. No respiratory distress.  Musculoskeletal: She exhibits no edema.  Lymphadenopathy:    She has no cervical adenopathy.  Neurological: She is alert. She exhibits normal muscle tone.  Psychiatric: Her behavior is normal.  Vitals reviewed.         Assessment & Plan:  #1 hyperlipidemia she is active she done a good job improving that she'll watch her diet stay physically active repeat this again later this year  #2 has history of osteoporosis she is tolerating medication she'll repeat bone density test in July 2016 she will call us to help set that up  #3 she will follow-up for febrile health checkup in August

## 2015-01-12 ENCOUNTER — Ambulatory Visit
Admission: RE | Admit: 2015-01-12 | Discharge: 2015-01-12 | Disposition: A | Discharge status: Home / Self Care | Payer: Medicare Other | Source: Ambulatory Visit

## 2015-01-12 DIAGNOSIS — Z1231 Encounter for screening mammogram for malignant neoplasm of breast: Secondary | ICD-10-CM

## 2015-02-05 ENCOUNTER — Other Ambulatory Visit: Payer: Self-pay | Admitting: Family Medicine

## 2015-07-02 ENCOUNTER — Telehealth: Payer: Self-pay | Admitting: Family Medicine

## 2015-07-02 ENCOUNTER — Other Ambulatory Visit: Payer: Self-pay | Admitting: Family Medicine

## 2015-07-02 DIAGNOSIS — M8580 Other specified disorders of bone density and structure, unspecified site: Secondary | ICD-10-CM

## 2015-07-02 NOTE — Telephone Encounter (Signed)
Bone Density test scheduled at the [facility] 07/20/15 @ 2:10pm

## 2015-07-02 NOTE — Telephone Encounter (Signed)
Pt is needing a bone density scheduled after July 31. Pt has those done at breast center in [place]. Pt is needing this scheduled so that she can have the results before scheduled an appt with Hoyle Sauer.

## 2015-07-02 NOTE — Telephone Encounter (Signed)
Patient notified of appt date and time and verbalized understanding.

## 2015-07-02 NOTE — Telephone Encounter (Signed)
Nurses please go ahead and schedule bone density after July 31 at the [facility] Center in [place] notify patient of the time please purpose of the tests screening and I believe she has a history of osteopenia

## 2015-07-10 ENCOUNTER — Other Ambulatory Visit: Payer: Self-pay | Admitting: Family Medicine

## 2015-07-20 ENCOUNTER — Ambulatory Visit
Admission: RE | Admit: 2015-07-20 | Discharge: 2015-07-20 | Disposition: A | Discharge status: Home / Self Care | Payer: Medicare Other | Source: Ambulatory Visit | Attending: Family Medicine | Admitting: Family Medicine

## 2015-07-20 DIAGNOSIS — M8580 Other specified disorders of bone density and structure, unspecified site: Secondary | ICD-10-CM

## 2015-08-05 ENCOUNTER — Other Ambulatory Visit: Payer: Self-pay

## 2015-08-05 DIAGNOSIS — M810 Age-related osteoporosis without current pathological fracture: Secondary | ICD-10-CM

## 2015-08-19 ENCOUNTER — Encounter: Payer: Self-pay | Admitting: Family Medicine

## 2015-08-21 ENCOUNTER — Encounter: Payer: Self-pay | Admitting: Nurse Practitioner

## 2015-08-21 ENCOUNTER — Ambulatory Visit (INDEPENDENT_AMBULATORY_CARE_PROVIDER_SITE_OTHER): Payer: Medicare Other | Admitting: Nurse Practitioner

## 2015-08-21 VITALS — BP 118/70 | Ht 64.0 in | Wt 111.4 lb

## 2015-08-21 DIAGNOSIS — Z0000 Encounter for general adult medical examination without abnormal findings: Secondary | ICD-10-CM | POA: Diagnosis not present

## 2015-08-21 DIAGNOSIS — R5383 Other fatigue: Secondary | ICD-10-CM | POA: Diagnosis not present

## 2015-08-21 DIAGNOSIS — E785 Hyperlipidemia, unspecified: Secondary | ICD-10-CM

## 2015-08-21 DIAGNOSIS — Z79899 Other long term (current) drug therapy: Secondary | ICD-10-CM | POA: Diagnosis not present

## 2015-08-21 DIAGNOSIS — Z23 Encounter for immunization: Secondary | ICD-10-CM

## 2015-08-21 MED ORDER — PNEUMOCOCCAL 13-VAL CONJ VACC IM SUSP
0.5000 mL | Freq: Once | INTRAMUSCULAR | Status: AC
  Administered 2015-08-21: 0.5 mL via INTRAMUSCULAR

## 2015-08-22 ENCOUNTER — Encounter: Payer: Self-pay | Admitting: Nurse Practitioner

## 2015-08-22 NOTE — Progress Notes (Signed)
   Subjective:    Patient ID: Pamela Yang, female    DOB: [birth year], 64 y.o.   MRN: [id number]  HPI presents for her wellness exam. Has seen 2 different gynecologists over the past 2 years; states her PAP smears were normal both times. Patient asking if HPV was done.  Results unavailable during office visit. No vaginal bleeding or pelvic pain. Married, same sexual partner. Regular vision and dental exams. Healthy diet. Active. Has appt with endocrinologist for worsening osteoporosis. Takes daily vitamin D and calcium.     Review of Systems  Constitutional: Positive for fatigue. Negative for activity change and appetite change.       Minimal fatigue.   HENT: Positive for hearing loss. Negative for dental problem, ear pain, sinus pressure and sore throat.   Respiratory: Negative for cough, chest tightness, shortness of breath and wheezing.   Cardiovascular: Negative for chest pain.  Gastrointestinal: Negative for nausea, vomiting, abdominal pain, diarrhea, constipation and abdominal distention.  Genitourinary: Negative for dysuria, urgency, frequency, vaginal bleeding, vaginal discharge, enuresis, difficulty urinating, genital sores and pelvic pain.       Objective:   Physical Exam  Constitutional: She is oriented to person, place, and time. She appears well-developed. No distress.  HENT:  Right Ear: External ear normal.  Left Ear: External ear normal.  Mouth/Throat: Oropharynx is clear and moist.  Hearing aid right ear.   Neck: Normal range of motion. Neck supple. No tracheal deviation present. No thyromegaly present.  Cardiovascular: Normal rate, regular rhythm and normal heart sounds.  Exam reveals no gallop.   No murmur heard. Pulmonary/Chest: Effort normal and breath sounds normal.  Abdominal: Soft. She exhibits no distension. There is no tenderness.  Genitourinary: Vagina normal and uterus normal. No vaginal discharge found.  External GU: no rashes or lesions. Signs of  hypoestrogenism. Vagina: pale and dry. Cervix normal in appearance. No CMT. Bimanual: no tenderness or obvious masses. Rectal exam: no masses or stool for hemoccult.   Musculoskeletal: She exhibits no edema.  Lymphadenopathy:    She has no cervical adenopathy.  Neurological: She is alert and oriented to person, place, and time.  Skin: Skin is warm and dry. No rash noted.  Psychiatric: She has a normal mood and affect. Her behavior is normal.  Breast exam: no masses; axillae no adenopathy.        Assessment & Plan:  Routine general medical examination at a health care facility  Hyperlipidemia - Plan: Lipid panel  Other fatigue - Plan: TSH  Need for vaccination - Plan: pneumococcal 13-valent conjugate vaccine (PREVNAR 13) injection 0.5 mL  High risk medication use - Plan: Hepatic function panel, Basic metabolic panel  Recommend flu vaccine when available.  Return in about 1 year (around 08/20/2016) for physical.

## 2015-08-23 LAB — BASIC METABOLIC PANEL
BUN / CREAT RATIO: 14 (ref 11–26)
BUN: 12 mg/dL (ref 8–27)
CHLORIDE: 102 mmol/L (ref 97–108)
CO2: 26 mmol/L (ref 18–29)
CREATININE: 0.87 mg/dL (ref 0.57–1.00)
Calcium: 9.9 mg/dL (ref 8.7–10.3)
GFR calc Af Amer: 80 mL/min/{1.73_m2} (ref >59)
GFR calc non Af Amer: 70 mL/min/{1.73_m2} (ref >59)
Glucose: 81 mg/dL (ref 65–99)
Potassium: 4.1 mmol/L (ref 3.5–5.2)
Sodium: 142 mmol/L (ref 134–144)

## 2015-08-23 LAB — HEPATIC FUNCTION PANEL
ALT: 37 IU/L — AB (ref 0–32)
AST: 35 IU/L (ref 0–40)
Albumin: 4.3 g/dL (ref 3.6–4.8)
Alkaline Phosphatase: 64 IU/L (ref 39–117)
BILIRUBIN TOTAL: 0.7 mg/dL (ref 0.0–1.2)
BILIRUBIN, DIRECT: 0.14 mg/dL (ref 0.00–0.40)
Total Protein: 6.7 g/dL (ref 6.0–8.5)

## 2015-08-23 LAB — LIPID PANEL
CHOL/HDL RATIO: 2.8 ratio (ref 0.0–4.4)
Cholesterol, Total: 192 mg/dL (ref 100–199)
HDL: 68 mg/dL (ref >39)
LDL Calculated: 107 mg/dL — ABNORMAL HIGH (ref 0–99)
TRIGLYCERIDES: 84 mg/dL (ref 0–149)
VLDL Cholesterol Cal: 17 mg/dL (ref 5–40)

## 2015-08-23 LAB — TSH: TSH: 2.29 u[IU]/mL (ref 0.450–4.500)

## 2015-09-03 ENCOUNTER — Other Ambulatory Visit: Payer: Self-pay | Admitting: *Deleted

## 2015-09-03 DIAGNOSIS — Z139 Encounter for screening, unspecified: Secondary | ICD-10-CM

## 2015-09-03 LAB — POC HEMOCCULT BLD/STL (HOME/3-CARD/SCREEN)
Card #2 Fecal Occult Blod, POC: NEGATIVE
Card #3 Fecal Occult Blood, POC: NEGATIVE
Fecal Occult Blood, POC: NEGATIVE

## 2015-09-13 ENCOUNTER — Other Ambulatory Visit: Payer: Self-pay | Admitting: Family Medicine

## 2015-12-23 ENCOUNTER — Other Ambulatory Visit: Payer: Self-pay | Admitting: Nurse Practitioner

## 2015-12-23 ENCOUNTER — Other Ambulatory Visit: Payer: Self-pay | Admitting: Family Medicine

## 2015-12-23 DIAGNOSIS — Z1231 Encounter for screening mammogram for malignant neoplasm of breast: Secondary | ICD-10-CM

## 2016-01-15 ENCOUNTER — Ambulatory Visit (HOSPITAL_COMMUNITY)
Admission: RE | Admit: 2016-01-15 | Discharge: 2016-01-15 | Disposition: A | Discharge status: Home / Self Care | Payer: Medicare Other | Source: Ambulatory Visit | Attending: Nurse Practitioner | Admitting: Nurse Practitioner

## 2016-01-15 ENCOUNTER — Ambulatory Visit (HOSPITAL_COMMUNITY): Payer: Medicare Other

## 2016-01-15 ENCOUNTER — Other Ambulatory Visit: Payer: Self-pay | Admitting: Nurse Practitioner

## 2016-01-15 DIAGNOSIS — Z1231 Encounter for screening mammogram for malignant neoplasm of breast: Secondary | ICD-10-CM

## 2016-01-19 ENCOUNTER — Other Ambulatory Visit: Payer: Self-pay | Admitting: Nurse Practitioner

## 2016-01-19 DIAGNOSIS — R928 Other abnormal and inconclusive findings on diagnostic imaging of breast: Secondary | ICD-10-CM

## 2016-01-20 ENCOUNTER — Ambulatory Visit
Admission: RE | Admit: 2016-01-20 | Discharge: 2016-01-20 | Disposition: A | Discharge status: Home / Self Care | Payer: Medicare Other | Source: Ambulatory Visit | Attending: Nurse Practitioner | Admitting: Nurse Practitioner

## 2016-01-20 DIAGNOSIS — R922 Inconclusive mammogram: Secondary | ICD-10-CM | POA: Diagnosis not present

## 2016-01-20 DIAGNOSIS — R928 Other abnormal and inconclusive findings on diagnostic imaging of breast: Secondary | ICD-10-CM

## 2016-02-23 ENCOUNTER — Ambulatory Visit (INDEPENDENT_AMBULATORY_CARE_PROVIDER_SITE_OTHER): Payer: Medicare Other

## 2016-02-23 ENCOUNTER — Ambulatory Visit (INDEPENDENT_AMBULATORY_CARE_PROVIDER_SITE_OTHER): Payer: Medicare Other | Admitting: Orthopaedic Surgery

## 2016-02-23 VITALS — BP 126/71 | HR 49 | Temp 97.9°F | Ht 64.0 in | Wt 112.2 lb

## 2016-02-23 DIAGNOSIS — M25512 Pain in left shoulder: Secondary | ICD-10-CM

## 2016-02-23 NOTE — Progress Notes (Addendum)
CC: shoulder pain left for six months  Subjective:    Patient ID: Pamela Yang, female    DOB: 24-Dec-1950, 65 y.o.   MRN: [id number]  Shoulder Pain  The pain is present in the left shoulder. This is a chronic problem. The current episode started more than 1 month ago. There has been no history of extremity trauma. The problem occurs daily. The problem has been gradually worsening. The quality of the pain is described as aching and dull. The pain is at a severity of 3/10. The pain is mild. The symptoms are aggravated by activity. She has tried cold, NSAIDS and rest for the symptoms. The treatment provided mild relief.   Dr. Sallee Lange has asked that she be seen for further evaluation as she is not getting any better.    Review of Systems  Constitutional:       She does not smoke.  She is not a diabetic  She does not have hypertension.  She does not have shortness of breath.  HENT: Positive for congestion and hearing loss.   Respiratory: Negative for shortness of breath.   Cardiovascular: Negative for chest pain.  Endocrine: Negative for cold intolerance.  Musculoskeletal: Positive for myalgias.  All other systems reviewed and are negative.      Objective:   Physical Exam  Constitutional: She is oriented to person, place, and time. She appears well-developed and well-nourished.  HENT:  Head: Normocephalic and atraumatic.  Eyes: Conjunctivae and EOM are normal. Pupils are equal, round, and reactive to light.  Neck: Normal range of motion. Neck supple.  Cardiovascular: Normal rate, regular rhythm and intact distal pulses.   Pulmonary/Chest: Effort normal.  Abdominal: Soft.  Musculoskeletal: She exhibits tenderness (The left shoulder is tender in full overhead positioning and with resisted abduction.  NV is intact.  Grips are normal.).       Left shoulder: She exhibits tenderness.       Arms: Neurological: She is alert and oriented to person, place, and time. She has normal  reflexes. She displays normal reflexes. No cranial nerve deficit. She exhibits normal muscle tone. Coordination normal.  Skin: Skin is warm and dry.  Psychiatric: She has a normal mood and affect. Her behavior is normal. Judgment and thought content normal.   X-rays were done of the left shoulder.  See separate report.  I have told her she could have a small rotator cuff tear of the left shoulder.  I have recommended a course of physical therapy.  I have told her about possible injection and possible MRI of the shoulder.  I will see how she does in PT.  I have recommended one Aleve twice a day after eating and take regularly until I see her again.  Encounter Diagnosis  Name Primary?  . Left shoulder pain Yes       Assessment & Plan:  Left shoulder pain.  Begin PT.

## 2016-02-23 NOTE — Addendum Note (Signed)
Addended by: Baldomero Lamy B on: 02/23/2016 03:55 PM   Modules accepted: Orders

## 2016-02-23 NOTE — Patient Instructions (Signed)
Begin PT. 

## 2016-03-07 ENCOUNTER — Encounter (HOSPITAL_COMMUNITY): Payer: Self-pay | Admitting: Specialist

## 2016-03-07 ENCOUNTER — Ambulatory Visit (HOSPITAL_COMMUNITY): Payer: Medicare Other | Attending: Orthopedic Surgery | Admitting: Specialist

## 2016-03-07 DIAGNOSIS — M25512 Pain in left shoulder: Secondary | ICD-10-CM | POA: Insufficient documentation

## 2016-03-07 DIAGNOSIS — M25612 Stiffness of left shoulder, not elsewhere classified: Secondary | ICD-10-CM | POA: Diagnosis not present

## 2016-03-07 DIAGNOSIS — M6281 Muscle weakness (generalized): Secondary | ICD-10-CM | POA: Insufficient documentation

## 2016-03-07 NOTE — Therapy (Signed)
[place], [place], [address] Phone: [phone]   Fax:  [phone]  Occupational Therapy Evaluation  Patient Details  Name: Pamela Yang MRN: [id number] Date of Birth: [birth year] Referring Provider: Dr. Sanjuana Kava  Encounter Date: 03/07/2016      OT End of Session - 03/07/16 1701    Visit Number 1   Number of Visits 12   Date for OT Re-Evaluation 04/18/16   Authorization Type BCBS Medicare    Authorization Time Period before 10th visit   Authorization - Visit Number 1   Authorization - Number of Visits 10   OT Start Time [id number]   OT Stop Time 1430   OT Time Calculation (min) 45 min   Activity Tolerance Patient tolerated treatment well   Behavior During Therapy [facility] for tasks assessed/performed      Past Medical History  Diagnosis Date  . Gastroparesis 5/08    Dr. Stann Mainland  . Hearing loss   . GERD (gastroesophageal reflux disease)   . Osteoporosis   . GAD (generalized anxiety disorder)     Past Surgical History  Procedure Laterality Date  . Colonoscopy  6/12    Dr Laural Golden    There were no vitals filed for this visit.  Visit Diagnosis:  Pain in joint of left shoulder - Plan: Ot plan of care cert/re-cert  Shoulder joint stiffness, left - Plan: Ot plan of care cert/re-cert      Subjective Assessment - 03/07/16 1650    Subjective  S:  My shoulder has been bothering me for about 6 months.    Pertinent History Mrs. Pamela Yang has been experiencing increased pain and decreased mobility into internal rotation for approximately 6 months.  She consulted with her primary care MD and was referred to Dr. Luna Glasgow.  X-rays were negative and patient has been referred to occupational therapy for evaluation and treatment.     Patient Stated Goals I want full use and no pain in my left arm.    Pain Score 10-Worst pain ever   Pain Location Shoulder   Pain Orientation Left   Pain Descriptors / Indicators Sharp    Pain Type Acute pain   Pain Radiating Towards elbow   Pain Onset More than a month ago   Pain Frequency Intermittent   Aggravating Factors  reaching behind back or into internal rotation causes sharp pain that dissipates as soon as movment stops   Pain Relieving Factors rest avoiding this movement   Effect of Pain on Daily Activities limits full use of left arm to reach behind back           [facility] OT Assessment - 03/07/16 0001    Assessment   Diagnosis Left Shoulder Pain   Referring Provider Dr. Sanjuana Kava   Onset Date --  6 months ago   Prior Therapy none   Precautions   Precautions None   Restrictions   Weight Bearing Restrictions No   Balance Screen   Has the patient fallen in the past 6 months No   Home  Environment   Lives With Spouse   Prior Function   Level of Independence Independent  driving   Vocation Retired   Leisure enjoys reading, exercising, volunteering and caring for her husband   ADL   ADL comments patient has limited use of left arm when reaching behind her back for her bra strap or to tuck a shirt in, reach the back seat of the car,  or reach out of the car window for the mailbox   Written Expression   Dominant Hand Right   Vision - History   Baseline Vision Wears glasses all the time   Vision Assessment   Vision Assessment Vision not tested   Cognition   Overall Cognitive Status Within Functional Limits for tasks assessed   Observation/Other Assessments   Focus on Therapeutic Outcomes (FOTO)  62/100   Sensation   Light Touch Appears Intact   Coordination   Gross Motor Movements are Fluid and Coordinated Yes   Fine Motor Movements are Fluid and Coordinated Yes   AROM   Overall AROM Comments assessed in standing, external rotation and internal rotation with shoulder abducted to 90   AROM Assessment Site Shoulder   Right/Left Shoulder Left   Left Shoulder Flexion 110 Degrees   Left Shoulder ABduction 150 Degrees   Left Shoulder Internal  Rotation 60 Degrees   Left Shoulder External Rotation 84 Degrees   PROM   Overall PROM Comments WFL except for flexion   PROM Assessment Site Shoulder   Right/Left Shoulder Left   Left Shoulder Flexion 150 Degrees   Strength   Overall Strength Comments assessed in standing External rotation and internal rotation with shoulder abducted to 90  scapualr stability is good - on left shoulder with visible winging of the left shoulder blade   Strength Assessment Site Shoulder   Right/Left Shoulder Left   Left Shoulder Flexion 5/5   Left Shoulder Extension 5/5   Left Shoulder ABduction 5/5   Left Shoulder Internal Rotation 5/5                  OT Treatments/Exercises (OP) - 03/07/16 0001    Manual Therapy   Manual Therapy Myofascial release   Manual therapy comments manual therapy completed seperately from all other interventions this date.    Myofascial Release myofascial release and manual stretching to left upper arm, scapular, and shoulder region to decrease pain and restrictions and improve pain free mobility                OT Education - 03/07/16 1700    Education provided Yes   Education Details shouder stretches:  flexion, cross body, retraction, internal rotation, external rotation table stretch   Person(s) Educated Patient   Methods Explanation;Demonstration;Handout   Comprehension Verbalized understanding;Returned demonstration          OT Short Term Goals - 03/07/16 1704    OT SHORT TERM GOAL #1   Title Patient will be educated on a HEP for improved A/ROM in left shoulder needed to return to reaching into back seat of car.    Time 3   Period Weeks   Status New   OT SHORT TERM GOAL #2   Title Patient will improve P/ROM of left shoulder flexion to WNL for increased abiilty to reach overhead.    Time 3   Period Weeks   Status New   OT SHORT TERM GOAL #3   Title Patient will decrease scapular winging by 50% for improved posture and better shoulder  alignment and less pain.    Time 3   Period Weeks   Status New   OT SHORT TERM GOAL #4   Title Patient will have 7/10 pain in her left shoulder or better when completing activities involving reaching behind her back.    Time 3   Period Weeks   Status New  OT Long Term Goals - 04/05/16 1706    OT LONG TERM GOAL #1   Title Patient will use left arm with all B/IADLS and leisure activities without pain and with full movement.    Time 6   Period Weeks   Status New   OT LONG TERM GOAL #2   Title Patient will have WNL A/ROM in her left shoulder for improved abiilty to fasten bra and reach into back seat of car.    Time 6   Period Weeks   Status New   OT LONG TERM GOAL #3   Title Patient will have WNL scapular stability in her left shoulder for improved posture and less left shoulder pain.    Time 6   Period Weeks   Status New   OT LONG TERM GOAL #4   Title Patient will have 3/10 pain or less in her left shoulder when completing functional activities that involve internal rotation of shoulder.    Time 6   Period Weeks   Status New   OT LONG TERM GOAL #5   Title Patient will have trace fascial restrictions in her left shoulder region for greater pain free mobility.    Time 6   Period Weeks   Status New               Plan - 04-05-16 1701    Clinical Impression Statement A:  Patient is a 65 year old female with past medical history significant for osteoporosis.  Patient began experience pain in her non dominant left shoulder approximately 6 months ago that is causing decreased use of her left arm with functional tasks such as fastening her bra, reaching out of her car window and reaching the back seat of her car.  She is active and enjoys exercising and caring for her husband.     Pt will benefit from skilled therapeutic intervention in order to improve on the following deficits (Retired) Decreased strength;Decreased range of motion;Increased muscle spasms;Increased  fascial restricitons;Pain   Rehab Potential Good   OT Frequency 2x / week   OT Duration 6 weeks   OT Treatment/Interventions Self-care/ADL training;Cryotherapy;Electrical Stimulation;Moist Heat;Ultrasound;Therapeutic exercise;Neuromuscular education;DME and/or AE instruction;Passive range of motion;Manual Therapy;Therapeutic exercises;Therapeutic activities;Patient/family education   Plan P:  Skilled OT intervention in order to return to full use of left arm with functional, leisure, and volunteer activities without pain.  Next session:  Manual therapy and stretching with AA/ROM progressing to A/ROM as tolerated.  Scapular stabilty exercises.    Consulted and Agree with Plan of Care Patient          G-Codes - 2016-04-05 1710    Functional Assessment Tool Used FOTO 62/100 38% impaired    Functional Limitation Carrying, moving and handling objects   Carrying, Moving and Handling Objects Current Status [phone]) At least 20 percent but less than 40 percent impaired, limited or restricted   Carrying, Moving and Handling Objects Goal Status [id number]) At least 1 percent but less than 20 percent impaired, limited or restricted      Problem List Patient Active Problem List   Diagnosis Date Noted  . Hyperlipidemia 12/31/2014  . Osteoporosis 08/13/2014  . SORE THROAT 03/31/2009  . DRY MOUTH 03/31/2009  . GERD 03/31/2009  . GASTROPARESIS 03/31/2009  . CONSTIPATION 03/31/2009  . NAUSEA 03/31/2009  . HEARTBURN 03/31/2009  . OTHER DYSPHAGIA 03/31/2009  . EPIGASTRIC PAIN 03/31/2009  . DEPRESSION, HX OF 03/31/2009    Vangie Bicker, OTR/L [phone]  04-05-2016, 5:12  PM  [place], [place], [address] Phone: [phone]   Fax:  [phone]  Name: IKRAM WILLHOIT MRN: [id number] Date of Birth: Feb 02, 1951

## 2016-03-07 NOTE — Patient Instructions (Signed)
Hold each stretch for 15 seconds to 30 seconds, for 3-5 repetitions 2-3 times per day, or as needed.  Scapular Retraction (Standing)   With arms at sides, pinch shoulder blades together. Repeat ____ times per set. Do ____ sets per session. Do ____ sessions per day.  http://orth.exer.us/944   Copyright  VHI. All rights reserved.   ROM: Towel Stretch - with Interior Rotation   Pull left arm up behind back by pulling towel up with other arm. Hold ____ seconds. Repeat ____ times per set. Do ____ sets per session. Do ____ sessions per day.   Posterior Capsule Stretch   Stand or sit, one arm across body so hand rests over opposite shoulder. Gently push on crossed elbow with other hand until stretch is felt in shoulder of crossed arm. Hold ___ seconds.  Repeat ___ times per session. Do ___ sessions per day.   Flexors Stretch, Standing   Stand near wall and slide arm up, with palm facing away from wall, by leaning toward wall. Hold ___ seconds.  Repeat ___ times per session. Do ___ sessions per day.  Copyright  VHI. All rights reserved.  Table Stretch: External Rotation    Sit with left arm on table. Lean forward until a stretch is felt in shoulder. Hold ____ seconds. Repeat ____ times. Do ____ sessions per day.  http://gt2.exer.us/106   Copyright  VHI. All rights reserved.

## 2016-03-09 ENCOUNTER — Ambulatory Visit (HOSPITAL_COMMUNITY): Payer: Medicare Other | Admitting: Specialist

## 2016-03-09 DIAGNOSIS — M25512 Pain in left shoulder: Secondary | ICD-10-CM | POA: Diagnosis not present

## 2016-03-09 DIAGNOSIS — M25612 Stiffness of left shoulder, not elsewhere classified: Secondary | ICD-10-CM

## 2016-03-09 NOTE — Therapy (Signed)
[place], [place], [address] Phone: [phone]   Fax:  [phone]  Occupational Therapy Treatment  Patient Details  Name: Pamela Yang MRN: [id number] Date of Birth: 12-Jan-1951 Referring Provider: Dr. Sanjuana Kava  Encounter Date: 03/09/2016      OT End of Session - 03/09/16 1616    Number of Visits 12   Date for OT Re-Evaluation 04/18/16  mini reassess on 03/28/16   Authorization Type BCBS Medicare    Authorization Time Period before 10th visit   Authorization - Visit Number 2   Authorization - Number of Visits 10   OT Start Time 1350   OT Stop Time 1432   OT Time Calculation (min) 42 min   Activity Tolerance Patient tolerated treatment well   Behavior During Therapy [facility] for tasks assessed/performed      Past Medical History  Diagnosis Date  . Gastroparesis 5/08    Dr. Stann Mainland  . Hearing loss   . GERD (gastroesophageal reflux disease)   . Osteoporosis   . GAD (generalized anxiety disorder)     Past Surgical History  Procedure Laterality Date  . Colonoscopy  6/12    Dr Laural Golden    There were no vitals filed for this visit.  Visit Diagnosis:  Pain in joint of left shoulder  Shoulder joint stiffness, left      Subjective Assessment - 03/09/16 1614    Subjective  S;  I have a few questions about my exercises for home   Currently in Pain? Yes   Pain Score 10-Worst pain ever   Pain Location Shoulder   Pain Orientation Left   Pain Descriptors / Indicators Sharp   Pain Type Acute pain            [facility] OT Assessment - 03/09/16 0001    Assessment   Diagnosis Left Shoulder Pain   Precautions   Precautions None   Restrictions   Weight Bearing Restrictions No                  OT Treatments/Exercises (OP) - 03/09/16 0001    Exercises   Exercises Shoulder   Shoulder Exercises: Supine   Protraction PROM;AAROM;10 reps   Horizontal ABduction PROM;AAROM;10 reps   External  Rotation PROM;AAROM;10 reps   Internal Rotation PROM;AAROM;10 reps   Flexion PROM;AAROM;10 reps   ABduction PROM;AAROM;10 reps   Other Supine Exercises serratus anterior punch 10 times    Shoulder Exercises: Seated   Elevation AROM;15 reps   Extension AROM;15 reps   Retraction AROM;15 reps   Row AROM;15 reps   Manual Therapy   Manual Therapy Myofascial release   Manual therapy comments manual therapy completed seperately from all other interventions this date.    Myofascial Release myofascial release and manual stretching to left upper arm, scapular, and shoulder region to decrease pain and restrictions and improve pain free mobility                 OT Education - 03/09/16 1616    Education Details reviewed HEP: Modified towel strecht to bring arm across body at waist band vs trying to bring arm to mid back and reviewed table stretch for external rotation patient demonstrated appropriate form and discussed height of table for ideal results.  reviewed treatment plan and goals and issued patient a copy          OT Short Term Goals - 03/09/16 1619    OT SHORT TERM GOAL #  1   Title Patient will be educated on a HEP for improved A/ROM in left shoulder needed to return to reaching into back seat of car.    Time 3   Period Weeks   Status On-going   OT SHORT TERM GOAL #2   Title Patient will improve P/ROM of left shoulder flexion to WNL for increased abiilty to reach overhead.    Time 3   Period Weeks   Status On-going   OT SHORT TERM GOAL #3   Title Patient will decrease scapular winging by 50% for improved posture and better shoulder alignment and less pain.    Time 3   Period Weeks   Status On-going   OT SHORT TERM GOAL #4   Title Patient will have 7/10 pain in her left shoulder or better when completing activities involving reaching behind her back.    Time 3   Period Weeks   Status On-going   OT SHORT TERM GOAL #5   Status On-going           OT Long Term Goals  - 03/09/16 1620    OT LONG TERM GOAL #1   Title Patient will use left arm with all B/IADLS and leisure activities without pain and with full movement.    Time 6   Period Weeks   Status On-going   OT LONG TERM GOAL #2   Title Patient will have WNL A/ROM in her left shoulder for improved abiilty to fasten bra and reach into back seat of car.    Time 6   Period Weeks   Status On-going   OT LONG TERM GOAL #3   Title Patient will have WNL scapular stability in her left shoulder for improved posture and less left shoulder pain.    Time 6   Period Weeks   Status On-going   OT LONG TERM GOAL #4   Title Patient will have 3/10 pain or less in her left shoulder when completing functional activities that involve internal rotation of shoulder.    Time 6   Period Weeks   Status On-going   OT LONG TERM GOAL #5   Title Patient will have trace fascial restrictions in her left shoulder region for greater pain free mobility.    Time 6   Period Weeks   Status On-going               Plan - 03/09/16 1617    Clinical Impression Statement A:  Patient followed up on HEP this date with a few questions and modifications to towel stretch made due to increased pain with technique originally given.  Began therapeutic exercises this date, patient completed exercises with good form.  AA/ROM in supine initiated with most difficulty with abduction   Plan P:  increase repetitions with AA/ROM, begin AA/ROM in seated, add proximal shoulder strengthening in supine, thumb tacks, and prot/ret//elev/dep to improve scapular stability, add pulleys for flexion for improved range.         Problem List Patient Active Problem List   Diagnosis Date Noted  . Hyperlipidemia 12/31/2014  . Osteoporosis 08/13/2014  . SORE THROAT 03/31/2009  . DRY MOUTH 03/31/2009  . GERD 03/31/2009  . GASTROPARESIS 03/31/2009  . CONSTIPATION 03/31/2009  . NAUSEA 03/31/2009  . HEARTBURN 03/31/2009  . OTHER DYSPHAGIA 03/31/2009  .  EPIGASTRIC PAIN 03/31/2009  . DEPRESSION, HX OF 03/31/2009    Vangie Bicker, OTR/L [phone]  03/09/2016, 4:33 PM  [place], [place], [address] Phone: [phone]   Fax:  [phone]  Name: SELETHA ZIMMERMANN MRN: [id number] Date of Birth: 1951/02/11

## 2016-03-10 ENCOUNTER — Ambulatory Visit: Payer: Medicare Other | Admitting: Orthopaedic Surgery

## 2016-03-14 NOTE — Addendum Note (Signed)
Addended by: Willette Pa on: 03/14/2016 10:02 PM   Modules accepted: Miquel Dunn

## 2016-03-15 ENCOUNTER — Ambulatory Visit: Payer: Medicare Other | Admitting: Orthopaedic Surgery

## 2016-03-16 ENCOUNTER — Ambulatory Visit (HOSPITAL_COMMUNITY): Payer: Medicare Other | Admitting: Specialist

## 2016-03-16 DIAGNOSIS — M25512 Pain in left shoulder: Secondary | ICD-10-CM

## 2016-03-16 DIAGNOSIS — M6281 Muscle weakness (generalized): Secondary | ICD-10-CM

## 2016-03-16 DIAGNOSIS — M25612 Stiffness of left shoulder, not elsewhere classified: Secondary | ICD-10-CM

## 2016-03-16 NOTE — Therapy (Signed)
[place], [place], [address] Phone: [phone]   Fax:  [phone]  Occupational Therapy Treatment  Patient Details  Name: KUULEI ADY MRN: [id number] Date of Birth: 1951/04/13 Referring Provider: Dr. Sanjuana Kava  Encounter Date: 03/16/2016      OT End of Session - 03/16/16 1341    Visit Number 3   Number of Visits 12   Date for OT Re-Evaluation 04/18/16  mini reassess on 03/28/16   Authorization Type BCBS Medicare    Authorization Time Period before 10th visit   Authorization - Visit Number 3   Authorization - Number of Visits 10   OT Start Time [id number]   OT Stop Time 1345   OT Time Calculation (min) 40 min   Activity Tolerance Patient tolerated treatment well   Behavior During Therapy [facility] for tasks assessed/performed      Past Medical History  Diagnosis Date  . Gastroparesis 5/08    Dr. Stann Mainland  . Hearing loss   . GERD (gastroesophageal reflux disease)   . Osteoporosis   . GAD (generalized anxiety disorder)     Past Surgical History  Procedure Laterality Date  . Colonoscopy  6/12    Dr Laural Golden    There were no vitals filed for this visit.  Visit Diagnosis:  Pain in joint of left shoulder  Shoulder joint stiffness, left  Muscle weakness (generalized)      Subjective Assessment - 03/16/16 1340    Subjective  S:  My arm seems a little sore today;  I may have stretched it too far.   Currently in Pain? Yes   Pain Score 8    Pain Location Shoulder   Pain Orientation Anterior   Pain Descriptors / Indicators Aching   Pain Type Acute pain   Pain Radiating Towards elbow and wrist   Pain Onset More than a month ago   Pain Frequency Intermittent   Pain Relieving Factors rest   Effect of Pain on Daily Activities limits full use of left arm             [facility] OT Assessment - 03/16/16 0001    Assessment   Diagnosis Left Shoulder Pain   Precautions   Precautions None   Restrictions    Weight Bearing Restrictions No                  OT Treatments/Exercises (OP) - 03/16/16 0001    Exercises   Exercises Shoulder   Shoulder Exercises: Supine   Protraction PROM;5 reps;AAROM;15 reps   Shoulder Exercises: Seated   Protraction AAROM;10 reps   Horizontal ABduction AAROM;10 reps   External Rotation AAROM;10 reps   Internal Rotation AAROM;10 reps   Flexion AAROM;10 reps   Abduction AAROM;10 reps   Shoulder Exercises: ROM/Strengthening   Proximal Shoulder Strengthening, Supine 10 times each without rest breaks    Manual Therapy   Manual Therapy Myofascial release   Manual therapy comments manual therapy completed seperately from all other interventions this date.    Myofascial Release myofascial release and manual stretching to left upper arm, scapular, and shoulder region to decrease pain and restrictions and improve pain free mobility   added manual cervical traction this date for decreased cervical pain                OT Education - 03/16/16 1330    Education Details added AA/ROM in supine 10 times each 2 times per day, shoulder protraction, horizontal  abduction, external rotation, internal rotation, flexion, abduction   Person(s) Educated Patient   Methods Explanation;Demonstration;Handout   Comprehension Verbalized understanding;Returned demonstration          OT Short Term Goals - 03/09/16 1619    OT SHORT TERM GOAL #1   Title Patient will be educated on a HEP for improved A/ROM in left shoulder needed to return to reaching into back seat of car.    Time 3   Period Weeks   Status On-going   OT SHORT TERM GOAL #2   Title Patient will improve P/ROM of left shoulder flexion to WNL for increased abiilty to reach overhead.    Time 3   Period Weeks   Status On-going   OT SHORT TERM GOAL #3   Title Patient will decrease scapular winging by 50% for improved posture and better shoulder alignment and less pain.    Time 3   Period Weeks    Status On-going   OT SHORT TERM GOAL #4   Title Patient will have 7/10 pain in her left shoulder or better when completing activities involving reaching behind her back.    Time 3   Period Weeks   Status On-going   OT SHORT TERM GOAL #5   Status On-going           OT Long Term Goals - 03/09/16 1620    OT LONG TERM GOAL #1   Title Patient will use left arm with all B/IADLS and leisure activities without pain and with full movement.    Time 6   Period Weeks   Status On-going   OT LONG TERM GOAL #2   Title Patient will have WNL A/ROM in her left shoulder for improved abiilty to fasten bra and reach into back seat of car.    Time 6   Period Weeks   Status On-going   OT LONG TERM GOAL #3   Title Patient will have WNL scapular stability in her left shoulder for improved posture and less left shoulder pain.    Time 6   Period Weeks   Status On-going   OT LONG TERM GOAL #4   Title Patient will have 3/10 pain or less in her left shoulder when completing functional activities that involve internal rotation of shoulder.    Time 6   Period Weeks   Status On-going   OT LONG TERM GOAL #5   Title Patient will have trace fascial restrictions in her left shoulder region for greater pain free mobility.    Time 6   Period Weeks   Status On-going               Plan - 03/16/16 1341    Clinical Impression Statement A:  increased P/ROM and A/ROM in supine this date.  pain at end range of flexion and abduction.  added aa/rom exercises to hep for improved range needed for functional activites.    Plan P:  transition to A/ROM in supine and add additional scapular stabiltiy exercises.    Consulted and Agree with Plan of Care Patient        Problem List Patient Active Problem List   Diagnosis Date Noted  . Hyperlipidemia 12/31/2014  . Osteoporosis 08/13/2014  . SORE THROAT 03/31/2009  . DRY MOUTH 03/31/2009  . GERD 03/31/2009  . GASTROPARESIS 03/31/2009  . CONSTIPATION  03/31/2009  . NAUSEA 03/31/2009  . HEARTBURN 03/31/2009  . OTHER DYSPHAGIA 03/31/2009  . EPIGASTRIC PAIN 03/31/2009  . DEPRESSION, HX  OF 03/31/2009    Vangie Bicker, OTR/L [phone]  03/16/2016, 2:17 PM  [place] [address] [place], [place], [address] Phone: [phone]   Fax:  [phone]  Name: HALEEMAH COUTURIER MRN: [id number] Date of Birth: 1951-05-15

## 2016-03-16 NOTE — Patient Instructions (Signed)
Perform each exercise _____15___ reps. 2-3x days.   Protraction - lying down  Start by holding a wand or cane at chest height.  Next, slowly push the wand outwards in front of your body so that your elbows become fully straightened. Then, return to the original position.     Shoulder FLEXION - lying on back - PALMS UP  In the standing position, hold a wand/cane with both arms, palms up on both sides. Raise up the wand/cane allowing your unaffected arm to perform most of the effort. Your affected arm should be partially relaxed.      Internal/External ROTATION - STANDING  In the standing position, hold a wand/cane with both hands keeping your elbows bent. Move your arms and wand/cane to one side.  Your affected arm should be partially relaxed while your unaffected arm performs most of the effort.       Shoulder ABDUCTION - lying on back  While holding a wand/cane palm face up on the injured side and palm face down on the uninjured side, slowly raise up your injured arm to the side.                     Horizontal Abduction/Adduction      Straight arms holding cane at shoulder height, bring cane to right, center, left. Repeat starting to left.   Copyright  VHI. All rights reserved.

## 2016-03-17 ENCOUNTER — Ambulatory Visit (INDEPENDENT_AMBULATORY_CARE_PROVIDER_SITE_OTHER): Payer: Medicare Other | Admitting: Orthopaedic Surgery

## 2016-03-17 VITALS — BP 108/62 | HR 52 | Temp 97.3°F | Ht 64.0 in | Wt 112.2 lb

## 2016-03-17 DIAGNOSIS — M25512 Pain in left shoulder: Secondary | ICD-10-CM | POA: Diagnosis not present

## 2016-03-17 NOTE — Progress Notes (Signed)
Patient ZN:[id number] JULLIANNA Yang, female DOB:05/19/1951, 65 y.o. [id number]  Chief Complaint  Patient presents with  . Follow-up    Left shoulder "a little better PT helping some"    HPI  Pamela Yang is a 65 y.o. female who is seen for follow-up of the left shoulder pain.  She has been going to PT and is making good progress.  She has better motion and less pain.  She still has a little ways to go.  Extension is her worst problem.  She has no redness, no numbness. She has no new trauma.  She is doing her exercises daily.  HPI  Body mass index is 19.25 kg/(m^2).  Review of Systems  Constitutional:       She does not smoke.  She is not a diabetic  She does not have hypertension.  She does not have shortness of breath.  HENT: Positive for congestion and hearing loss.   Respiratory: Negative for shortness of breath.   Cardiovascular: Negative for chest pain.  Endocrine: Negative for cold intolerance.  Musculoskeletal: Positive for myalgias and arthralgias.  All other systems reviewed and are negative.   Past Medical History  Diagnosis Date  . Gastroparesis 5/08    Dr. Stann Mainland  . Hearing loss   . GERD (gastroesophageal reflux disease)   . Osteoporosis   . GAD (generalized anxiety disorder)     Past Surgical History  Procedure Laterality Date  . Colonoscopy  6/12    Dr Laural Golden    Family History  Problem Relation Age of Onset  . Heart attack Mother   . Ovarian cancer Sister     Social History Social History  Substance Use Topics  . Smoking status: Never Smoker   . Smokeless tobacco: Not on file  . Alcohol Use: Not on file    No Known Allergies  Current Outpatient Prescriptions  Medication Sig Dispense Refill  . Ascorbic Acid (VITAMIN C PO) Take by mouth. daily    . aspirin 81 MG tablet Take 81 mg by mouth daily.    Marland Kitchen CALCIUM-VITAMIN D PO Take by mouth. bid    . citalopram (CELEXA) 20 MG tablet TAKE ONE (1) TABLET EACH DAY 30 tablet 2  . denosumab (PROLIA)  60 MG/ML SOLN injection Inject 60 mg into the skin every 6 (six) months. Administer in upper arm, thigh, or abdomen    . Flaxseed, Linseed, (FLAXSEED OIL PO) Take by mouth. 2 daily    . IRON PO Take by mouth. daily    . Multiple Vitamins-Minerals (MULTIVITAMIN WITH MINERALS) tablet Take 1 tablet by mouth daily.    Marland Kitchen omeprazole (PRILOSEC) 20 MG capsule Take 20 mg by mouth daily.     No current facility-administered medications for this visit.     Physical Exam  Blood pressure 108/62, pulse 52, temperature 97.3 F (36.3 C), height 5\' 4"  (1.626 m), weight 112 lb 3.2 oz (50.894 kg).  Constitutional: overall normal hygiene, normal nutrition, well developed, normal grooming, normal body habitus. Assistive device:none  Musculoskeletal: gait and station Limp none, muscle tone and strength are normal, no tremors or atrophy is present.  .  Neurological: coordination overall normal.  Deep tendon reflex/nerve stretch intact.  Sensation normal.  Cranial nerves II-XII intact.   Skin:   normal overall no scars, lesions, ulcers or rashes. No psoriasis.  Psychiatric: Alert and oriented x 3.  Recent memory intact, remote memory unclear.  Normal mood and affect. Well groomed.  Good eye contact.  Cardiovascular: overall  no swelling, no varicosities, no edema bilaterally, normal temperatures of the legs and arms, no clubbing, cyanosis and good capillary refill.  Lymphatic: palpation is normal.  Examination of left Upper Extremity is done.  Inspection:   Overall:  Elbow non-tender without crepitus or defects, forearm non-tender without crepitus or defects, wrist non-tender without crepitus or defects, hand non-tender.    Shoulder: with glenohumeral joint tenderness, without effusion.   Upper arm: without swelling and tenderness   Range of motion:   Overall:  Full range of motion of the elbow, full range of motion of wrist and full range of motion in fingers.   Shoulder:  left  180 degrees forward  flexion; 160 degrees abduction; 35 degrees internal rotation, 35 degrees external rotation, 10 degrees extension, 40 degrees adduction.   Stability:   Overall:  Shoulder, elbow and wrist stable   Strength and Tone:   Overall full shoulder muscles strength, full upper arm strength and normal upper arm bulk and tone.  The patient has been educated about the nature of the problem(s) and counseled on treatment options.  The patient appeared to understand what I have discussed and is in agreement with it.  Encounter Diagnosis  Name Primary?  . Left shoulder pain Yes    PLAN Call if any problems.  Precautions discussed.  Continue current medications.   Return to clinic 3 weeks  Continue PT.

## 2016-03-18 ENCOUNTER — Encounter (HOSPITAL_COMMUNITY): Payer: Self-pay

## 2016-03-18 ENCOUNTER — Ambulatory Visit (HOSPITAL_COMMUNITY): Payer: Medicare Other

## 2016-03-18 DIAGNOSIS — M25512 Pain in left shoulder: Secondary | ICD-10-CM

## 2016-03-18 DIAGNOSIS — M6281 Muscle weakness (generalized): Secondary | ICD-10-CM

## 2016-03-18 DIAGNOSIS — M25612 Stiffness of left shoulder, not elsewhere classified: Secondary | ICD-10-CM

## 2016-03-18 NOTE — Therapy (Signed)
[facility] [place], [place], [address] Phone: [phone]   Fax:  [phone]  Occupational Therapy Treatment  Patient Details  Name: Pamela Yang MRN: [id number] Date of Birth: 07-20-1951 Referring Provider: Dr. Sanjuana Kava  Encounter Date: 03/18/2016      OT End of Session - 03/18/16 1428    Visit Number 4   Number of Visits 12   Date for OT Re-Evaluation 04/18/16  mini reassess on 03/28/16   Authorization Type BCBS Medicare    Authorization Time Period before 10th visit   Authorization - Visit Number 4   Authorization - Number of Visits 10   OT Start Time [id number]   OT Stop Time 1425   OT Time Calculation (min) 40 min   Activity Tolerance Patient tolerated treatment well   Behavior During Therapy [facility] for tasks assessed/performed      Past Medical History  Diagnosis Date  . Gastroparesis 5/08    Dr. Stann Mainland  . Hearing loss   . GERD (gastroesophageal reflux disease)   . Osteoporosis   . GAD (generalized anxiety disorder)     Past Surgical History  Procedure Laterality Date  . Colonoscopy  6/12    Dr Laural Golden    There were no vitals filed for this visit.  Visit Diagnosis:  Pain in joint of left shoulder  Shoulder joint stiffness, left  Muscle weakness (generalized)      Subjective Assessment - 03/18/16 1349    Subjective  S: I don't have any pain unless I do something weird.   Currently in Pain? No/denies            [facility] OT Assessment - 03/18/16 1350    Assessment   Diagnosis Left Shoulder Pain   Precautions   Precautions None                  OT Treatments/Exercises (OP) - 03/18/16 1350    Exercises   Exercises Shoulder   Shoulder Exercises: Supine   Protraction PROM;5 reps;AROM;10 reps   Horizontal ABduction PROM;5 reps;AROM;AAROM;10 reps   External Rotation PROM;5 reps;AROM;10 reps   Internal Rotation PROM;5 reps;AROM;10 reps   Flexion PROM;5 reps;AROM;AAROM;10 reps    ABduction PROM;5 reps;AROM;AAROM;10 reps   Other Supine Exercises serratus anterior punch 10 times    Shoulder Exercises: Seated   Elevation --   Extension --   Protraction --   Horizontal ABduction --   External Rotation --   Internal Rotation --   Flexion --   Abduction --   Shoulder Exercises: Standing   Protraction AROM;10 reps   Horizontal ABduction AROM;10 reps   External Rotation AROM;10 reps  abduction   Internal Rotation AROM;10 reps  abduction   Flexion AROM;10 reps   ABduction AROM;10 reps   Extension AROM;10 reps   Shoulder Exercises: ROM/Strengthening   "W" Arms 10X in standing   X to V Arms 10X in standing   Proximal Shoulder Strengthening, Supine 10 times each without rest breaks    Proximal Shoulder Strengthening, Seated 10X standing   Manual Therapy   Manual Therapy Myofascial release;Muscle Energy Technique   Manual therapy comments manual therapy completed seperately from all other interventions this date.    Myofascial Release myofascial release and manual stretching to left upper arm, scapular, and shoulder region to decrease pain and restrictions and improve pain free mobility   added manual cervical traction this date for decreased cervical pain   Muscle Energy Technique Muscle energy  technique to left anterior deltoid to decrease muscle spasm and increase joint ROM.                 OT Education - 03/18/16 1356    Education provided Yes   Education Details Educated patient on how knots limit ROM and how to eliminate them. Heat, stretch, massage, etc.   Person(s) Educated Patient   Methods Explanation   Comprehension Verbalized understanding          OT Short Term Goals - 03/09/16 1619    OT SHORT TERM GOAL #1   Title Patient will be educated on a HEP for improved A/ROM in left shoulder needed to return to reaching into back seat of car.    Time 3   Period Weeks   Status On-going   OT SHORT TERM GOAL #2   Title Patient will improve  P/ROM of left shoulder flexion to WNL for increased abiilty to reach overhead.    Time 3   Period Weeks   Status On-going   OT SHORT TERM GOAL #3   Title Patient will decrease scapular winging by 50% for improved posture and better shoulder alignment and less pain.    Time 3   Period Weeks   Status On-going   OT SHORT TERM GOAL #4   Title Patient will have 7/10 pain in her left shoulder or better when completing activities involving reaching behind her back.    Time 3   Period Weeks   Status On-going   OT SHORT TERM GOAL #5   Status On-going           OT Long Term Goals - 03/09/16 1620    OT LONG TERM GOAL #1   Title Patient will use left arm with all B/IADLS and leisure activities without pain and with full movement.    Time 6   Period Weeks   Status On-going   OT LONG TERM GOAL #2   Title Patient will have WNL A/ROM in her left shoulder for improved abiilty to fasten bra and reach into back seat of car.    Time 6   Period Weeks   Status On-going   OT LONG TERM GOAL #3   Title Patient will have WNL scapular stability in her left shoulder for improved posture and less left shoulder pain.    Time 6   Period Weeks   Status On-going   OT LONG TERM GOAL #4   Title Patient will have 3/10 pain or less in her left shoulder when completing functional activities that involve internal rotation of shoulder.    Time 6   Period Weeks   Status On-going   OT LONG TERM GOAL #5   Title Patient will have trace fascial restrictions in her left shoulder region for greater pain free mobility.    Time 6   Period Weeks   Status On-going               Plan - 03/18/16 1429    Clinical Impression Statement A: Added A/ROM exercises in supine and standing. Discontinued seated exercises. Added W arms and X to V arms this session. Patient notes increased movement after P/ROM and A/ROM exercises this session without increased pain.   Plan P: Add ball on the wall and overhead lacing to  increase shoulder strength and stability. Continue with A/ROM exercises in standing to increase use of LUE during daily tasks.        Problem List Patient Active  Problem List   Diagnosis Date Noted  . Hyperlipidemia 12/31/2014  . Osteoporosis 08/13/2014  . SORE THROAT 03/31/2009  . DRY MOUTH 03/31/2009  . GERD 03/31/2009  . GASTROPARESIS 03/31/2009  . CONSTIPATION 03/31/2009  . NAUSEA 03/31/2009  . HEARTBURN 03/31/2009  . OTHER DYSPHAGIA 03/31/2009  . EPIGASTRIC PAIN 03/31/2009  . DEPRESSION, HX OF 03/31/2009    Marijo Conception, OTA student [phone]   03/18/2016, 2:38 PM  [facility] [address] [place], [place], [address] Phone: [phone]   Fax:  [phone]  Name: Pamela Yang MRN: [id number] Date of Birth: 10-08-1951  Ailene Ravel, OTR/L,CBIS  [phone]  This entire session was guided, instructed, and directly supervised by Ailene Ravel, OTR/L, CBIS.

## 2016-03-21 ENCOUNTER — Ambulatory Visit (HOSPITAL_COMMUNITY): Payer: Medicare Other | Attending: Orthopedic Surgery | Admitting: Occupational Therapy

## 2016-03-21 ENCOUNTER — Encounter (HOSPITAL_COMMUNITY): Payer: Self-pay | Admitting: Occupational Therapy

## 2016-03-21 DIAGNOSIS — M25512 Pain in left shoulder: Secondary | ICD-10-CM | POA: Insufficient documentation

## 2016-03-21 DIAGNOSIS — M6281 Muscle weakness (generalized): Secondary | ICD-10-CM

## 2016-03-21 DIAGNOSIS — M25612 Stiffness of left shoulder, not elsewhere classified: Secondary | ICD-10-CM

## 2016-03-21 NOTE — Therapy (Signed)
[facility] [place], [place], [address] Phone: [phone]   Fax:  [phone]  Occupational Therapy Treatment  Patient Details  Name: Pamela Yang MRN: [id number] Date of Birth: [birth year] Referring Provider: Dr. Sanjuana Kava  Encounter Date: 03/21/2016      OT End of Session - 03/21/16 1433    Visit Number 5   Number of Visits 12   Date for OT Re-Evaluation 04/18/16  mini reassess on 03/28/16   Authorization Type [employer] Medicare    Authorization Time Period before 10th visit   Authorization - Visit Number 5   Authorization - Number of Visits 10   OT Start Time [id number]   OT Stop Time 1431   OT Time Calculation (min) 44 min   Activity Tolerance Patient tolerated treatment well   Behavior During Therapy [facility] for tasks assessed/performed      Past Medical History  Diagnosis Date  . Gastroparesis 5/08    Dr. Stann Mainland  . Hearing loss   . GERD (gastroesophageal reflux disease)   . Osteoporosis   . GAD (generalized anxiety disorder)     Past Surgical History  Procedure Laterality Date  . Colonoscopy  6/12    Dr Laural Golden    There were no vitals filed for this visit.  Visit Diagnosis:  Pain in joint of left shoulder  Shoulder joint stiffness, left  Muscle weakness (generalized)      Subjective Assessment - 03/21/16 1348    Subjective  S: It's getting better, I can tell a difference in my motion.    Currently in Pain? No/denies            [facility] OT Assessment - 03/21/16 1347    Assessment   Diagnosis Left Shoulder Pain   Precautions   Precautions None                  OT Treatments/Exercises (OP) - 03/21/16 1349    Exercises   Exercises Shoulder   Shoulder Exercises: Supine   Protraction PROM;5 reps;AROM;10 reps   Horizontal ABduction PROM;5 reps;AROM;10 reps   External Rotation PROM;5 reps;AROM;10 reps   Internal Rotation PROM;5 reps;AROM;10 reps   Flexion PROM;5 reps;AAROM;10 reps   ABduction PROM;5 reps;AROM;10 reps   Other Supine Exercises serratus anterior punch 10 times    Shoulder Exercises: Standing   Protraction AROM;10 reps   Horizontal ABduction AROM;10 reps   External Rotation AROM;10 reps  abduction   Internal Rotation AROM;10 reps  abduction   Flexion AROM;10 reps   ABduction AROM;10 reps   Shoulder Exercises: ROM/Strengthening   Over Head Lace 1'   "W" Arms 10X in standing   X to V Arms 10X in standing   Proximal Shoulder Strengthening, Supine 10 times each without rest breaks    Proximal Shoulder Strengthening, Seated 10X standing, no rest breaks   Ball on Wall 1' flexion and abduction   Manual Therapy   Manual Therapy Myofascial release;Muscle Energy Technique   Manual therapy comments manual therapy completed seperately from all other interventions this date.    Myofascial Release myofascial release and manual stretching to left upper arm, scapular, and shoulder region to decrease pain and restrictions and improve pain free mobility   added manual cervical traction this date for decreased cervical pain   Muscle Energy Technique Muscle energy technique to left anterior deltoid to decrease muscle spasm and increase joint ROM.  OT Short Term Goals - 03/09/16 1619    OT SHORT TERM GOAL #1   Title Patient will be educated on a HEP for improved A/ROM in left shoulder needed to return to reaching into back seat of car.    Time 3   Period Weeks   Status On-going   OT SHORT TERM GOAL #2   Title Patient will improve P/ROM of left shoulder flexion to WNL for increased abiilty to reach overhead.    Time 3   Period Weeks   Status On-going   OT SHORT TERM GOAL #3   Title Patient will decrease scapular winging by 50% for improved posture and better shoulder alignment and less pain.    Time 3   Period Weeks   Status On-going   OT SHORT TERM GOAL #4   Title Patient will have 7/10 pain in her left shoulder or better when  completing activities involving reaching behind her back.    Time 3   Period Weeks   Status On-going   OT SHORT TERM GOAL #5   Status On-going           OT Long Term Goals - 03/09/16 1620    OT LONG TERM GOAL #1   Title Patient will use left arm with all B/IADLS and leisure activities without pain and with full movement.    Time 6   Period Weeks   Status On-going   OT LONG TERM GOAL #2   Title Patient will have WNL A/ROM in her left shoulder for improved abiilty to fasten bra and reach into back seat of car.    Time 6   Period Weeks   Status On-going   OT LONG TERM GOAL #3   Title Patient will have WNL scapular stability in her left shoulder for improved posture and less left shoulder pain.    Time 6   Period Weeks   Status On-going   OT LONG TERM GOAL #4   Title Patient will have 3/10 pain or less in her left shoulder when completing functional activities that involve internal rotation of shoulder.    Time 6   Period Weeks   Status On-going   OT LONG TERM GOAL #5   Title Patient will have trace fascial restrictions in her left shoulder region for greater pain free mobility.    Time 6   Period Weeks   Status On-going               Plan - 03/21/16 1433    Clinical Impression Statement A: Added ball on the ball, overhead lacing, continued with A/ROM exercises supine & standing. Pt with good form, intermittent verbal cuing required for technique. Pt reports she is noticing a difference in her range and mobility at home during daily tasks.    Plan P: Add scapular theraband for scapular mobility and stability. Update HEP for A/ROM exercises        Problem List Patient Active Problem List   Diagnosis Date Noted  . Hyperlipidemia 12/31/2014  . Osteoporosis 08/13/2014  . SORE THROAT 03/31/2009  . DRY MOUTH 03/31/2009  . GERD 03/31/2009  . GASTROPARESIS 03/31/2009  . CONSTIPATION 03/31/2009  . NAUSEA 03/31/2009  . HEARTBURN 03/31/2009  . OTHER DYSPHAGIA  03/31/2009  . EPIGASTRIC PAIN 03/31/2009  . DEPRESSION, HX OF 03/31/2009    Guadelupe Sabin, OTR/L  [phone]  03/21/2016, 2:36 PM  [facility] [address] [place], [place], [address] Phone: [phone]  Fax:  [phone]  Name: Pamela Yang MRN: [id number] Date of Birth: [birth year]

## 2016-03-24 ENCOUNTER — Encounter (HOSPITAL_COMMUNITY): Payer: Self-pay | Admitting: Occupational Therapy

## 2016-03-24 ENCOUNTER — Ambulatory Visit (HOSPITAL_COMMUNITY): Payer: Medicare Other | Admitting: Occupational Therapy

## 2016-03-24 DIAGNOSIS — M25512 Pain in left shoulder: Secondary | ICD-10-CM | POA: Diagnosis not present

## 2016-03-24 DIAGNOSIS — M6281 Muscle weakness (generalized): Secondary | ICD-10-CM

## 2016-03-24 DIAGNOSIS — M25612 Stiffness of left shoulder, not elsewhere classified: Secondary | ICD-10-CM

## 2016-03-24 NOTE — Therapy (Signed)
Pamela Yang, [place], [address] Phone: [phone]   Fax:  [phone]  Occupational Therapy Treatment  Patient Details  Name: Pamela Yang MRN: [id number] Date of Birth: [birth year] Referring Provider: Dr. Sanjuana Kava  Encounter Date: 03/24/2016      OT End of Session - 03/24/16 1533    Visit Number 6   Number of Visits 12   Date for OT Re-Evaluation 04/18/16  mini reassess on 03/28/16   Authorization Type BC[employer] Medicare    Authorization Time Period before 10th visit   Authorization - Visit Number 6   Authorization - Number of Visits 10   OT Start Time 1300   OT Stop Time 1344   OT Time Calculation (min) 44 min   Activity Tolerance Patient tolerated treatment well   Behavior During Therapy [facility] for tasks assessed/performed      Past Medical History  Diagnosis Date  . Gastroparesis 5/08    Dr. Stann Mainland  . Hearing loss   . GERD (gastroesophageal reflux disease)   . Osteoporosis   . GAD (generalized anxiety disorder)     Past Surgical History  Procedure Laterality Date  . Colonoscopy  6/12    Dr Laural Golden    There were no vitals filed for this visit.  Visit Diagnosis:  Pain in left shoulder  Stiffness of left shoulder, not elsewhere classified  Muscle weakness (generalized)      Subjective Assessment - 03/24/16 1259    Subjective  S: I raised up in the bed last night & put weight on my elbow and it hurt really bad for about 10 seconds.    Currently in Pain? No/denies            [facility] OT Assessment - 03/24/16 1259    Assessment   Diagnosis Left Shoulder Pain   Precautions   Precautions None                  OT Treatments/Exercises (OP) - 03/24/16 1302    Exercises   Exercises Shoulder   Shoulder Exercises: Supine   Protraction PROM;5 reps;AROM;10 reps   Horizontal ABduction PROM;5 reps;AROM;10 reps   External Rotation PROM;5 reps;AROM;10 reps   Internal Rotation PROM;5  reps;AROM;10 reps   Flexion PROM;5 reps;AROM;10 reps   ABduction PROM;5 reps;AROM;10 reps   Other Supine Exercises serratus anterior punch 10 times    Shoulder Exercises: Standing   Protraction AROM;10 reps   Horizontal ABduction AROM;10 reps   External Rotation AROM;10 reps  abduction   Internal Rotation AROM;10 reps  abduction   Flexion AROM;10 reps   ABduction AROM;10 reps   Extension Theraband;10 reps   Theraband Level (Shoulder Extension) Level 2 (Red)   Row Theraband;10 reps   Theraband Level (Shoulder Row) Level 2 (Red)   Retraction Theraband;10 reps   Theraband Level (Shoulder Retraction) Level 2 (Red)   Shoulder Exercises: ROM/Strengthening   UBE (Upper Arm Bike) Level 1 2' forward 2' reverse   Over Head Lace 1'30"   "W" Arms 10X in standing   X to V Arms 10X in standing   Proximal Shoulder Strengthening, Supine 10 times each without rest breaks    Proximal Shoulder Strengthening, Seated 10X standing, no rest breaks   Ball on Wall 1' flexion and abduction   Manual Therapy   Manual Therapy Myofascial release;Muscle Energy Technique   Manual therapy comments manual therapy completed seperately from all other interventions this date.    Myofascial Release  myofascial release and manual stretching to left upper arm, scapular, and shoulder region to decrease pain and restrictions and improve pain free mobility   added manual cervical traction this date for decreased cervical pain   Muscle Energy Technique Muscle energy technique to left anterior deltoid to decrease muscle spasm and increase joint ROM.                 OT Education - 03/24/16 1326    Education provided Yes   Education Details A/ROM exercises   Person(s) Educated Patient   Methods Explanation;Demonstration;Handout   Comprehension Verbalized understanding;Returned demonstration          OT Short Term Goals - 03/09/16 1619    OT SHORT TERM GOAL #1   Title Patient will be educated on a HEP for  improved A/ROM in left shoulder needed to return to reaching into back seat of car.    Time 3   Period Weeks   Status On-going   OT SHORT TERM GOAL #2   Title Patient will improve P/ROM of left shoulder flexion to WNL for increased abiilty to reach overhead.    Time 3   Period Weeks   Status On-going   OT SHORT TERM GOAL #3   Title Patient will decrease scapular winging by 50% for improved posture and better shoulder alignment and less pain.    Time 3   Period Weeks   Status On-going   OT SHORT TERM GOAL #4   Title Patient will have 7/10 pain in her left shoulder or better when completing activities involving reaching behind her back.    Time 3   Period Weeks   Status On-going   OT SHORT TERM GOAL #5   Status On-going           OT Long Term Goals - 03/09/16 1620    OT LONG TERM GOAL #1   Title Patient will use left arm with all B/IADLS and leisure activities without pain and with full movement.    Time 6   Period Weeks   Status On-going   OT LONG TERM GOAL #2   Title Patient will have WNL A/ROM in her left shoulder for improved abiilty to fasten bra and reach into back seat of car.    Time 6   Period Weeks   Status On-going   OT LONG TERM GOAL #3   Title Patient will have WNL scapular stability in her left shoulder for improved posture and less left shoulder pain.    Time 6   Period Weeks   Status On-going   OT LONG TERM GOAL #4   Title Patient will have 3/10 pain or less in her left shoulder when completing functional activities that involve internal rotation of shoulder.    Time 6   Period Weeks   Status On-going   OT LONG TERM GOAL #5   Title Patient will have trace fascial restrictions in her left shoulder region for greater pain free mobility.    Time 6   Period Weeks   Status On-going               Plan - 03/24/16 1533    Clinical Impression Statement A: Added scapular theraband and UBE, increased overhead lacing to 1'30". Pt required verbal and  visual cuing for red theraband exercises, completed supine & standing exercises with good form and A/ROM [facility]. Today is the first session she has been able to touch her hands together during abduction in  standing. Min fatigue noted at end of session.    Pt will benefit from skilled therapeutic intervention in order to improve on the following deficits (Retired) Decreased strength;Decreased range of motion;Increased muscle spasms;Increased fascial restricitons;Pain   Rehab Potential Good   OT Frequency 2x / week   OT Duration 6 weeks   OT Treatment/Interventions Self-care/ADL training;Cryotherapy;Electrical Stimulation;Moist Heat;Ultrasound;Therapeutic exercise;Neuromuscular education;DME and/or AE instruction;Passive range of motion;Manual Therapy;Therapeutic exercises;Therapeutic activities;Patient/family education   Plan P: Update HEP for A/ROM and scapular theraband exercises. Add standing field goal for improved scapular stability        Problem List Patient Active Problem List   Diagnosis Date Noted  . Hyperlipidemia 12/31/2014  . Osteoporosis 08/13/2014  . SORE THROAT 03/31/2009  . DRY MOUTH 03/31/2009  . GERD 03/31/2009  . GASTROPARESIS 03/31/2009  . CONSTIPATION 03/31/2009  . NAUSEA 03/31/2009  . HEARTBURN 03/31/2009  . OTHER DYSPHAGIA 03/31/2009  . EPIGASTRIC PAIN 03/31/2009  . DEPRESSION, HX OF 03/31/2009    Guadelupe Sabin, OTR/L  [phone]  03/24/2016, 3:39 PM  [facility] [address] [place], [place], [address] Phone: [phone]   Fax:  [phone]  Name: JAZAYA HASSER MRN: [id number] Date of Birth: [birth year]

## 2016-03-24 NOTE — Patient Instructions (Signed)
1) Shoulder Protraction    Begin with elbows by your side, slowly "punch" straight out in front of you keeping arms/elbows straight.      2) Shoulder Flexion  Supine:     Standing:         Begin with arms at your side with thumbs pointed up, slowly raise both arms up and forward towards overhead.               3) Horizontal abduction/adduction  Supine:   Standing:           Begin with arms straight out in front of you, bring out to the side in at "T" shape. Keep arms straight entire time.                 4) Internal & External Rotation    *No band* -Stand with elbows at the side and elbows bent 90 degrees. Move your forearms away from your body, then bring back inward toward the body.     5) Shoulder Abduction  Supine:     Standing:       Lying on your back begin with your arms flat on the table next to your side. Slowly move your arms out to the side so that they go overhead, in a jumping jack or snow angel movement.    6) X to V arms (cheerleader move):  Begin with arms straight down, crossed in front of body in an "X". Keeping arms crossed, lift arms straight up overhead. Then spread arms apart into a "V" shape.  Bring back together into x and lower down to starting position.    7) W arms:  Begin with elbows bent and even with shoulders, hands pointing to ceiling. Keeping elbows at shoulder level, 1-shrug shoulders up, 2-squeeze shoulder blades together, and 3-relax.    Repeat all exercises 10-15 times, 1-2 times per day.  

## 2016-03-28 ENCOUNTER — Ambulatory Visit (HOSPITAL_COMMUNITY): Payer: Medicare Other | Admitting: Specialist

## 2016-03-28 ENCOUNTER — Other Ambulatory Visit: Payer: Self-pay | Admitting: Family Medicine

## 2016-03-28 DIAGNOSIS — M6281 Muscle weakness (generalized): Secondary | ICD-10-CM

## 2016-03-28 DIAGNOSIS — M25512 Pain in left shoulder: Secondary | ICD-10-CM | POA: Diagnosis not present

## 2016-03-28 DIAGNOSIS — M25612 Stiffness of left shoulder, not elsewhere classified: Secondary | ICD-10-CM

## 2016-03-28 NOTE — Patient Instructions (Signed)
Complete each exercise 10-15 times, 1-2 times per day   (Home) Extension: Isometric / Bilateral Arm Retraction - Sitting   Facing anchor, hold hands and elbow at shoulder height, with elbow bent.  Pull arms back to squeeze shoulder blades together. Repeat 10-15 times.  Copyright  VHI. All rights reserved.   (Home) Retraction: Row - Bilateral (Anchor)   Facing anchor, arms reaching forward, pull hands toward stomach, keeping elbows bent and at your sides and pinching shoulder blades together. Repeat 10-15 times.  Copyright  VHI. All rights reserved.   (Clinic) Extension / Flexion (Assist)   Face anchor, pull arms back, keeping elbow straight, and squeze shoulder blades together. Repeat 10-15 times.   Copyright  VHI. All rights reserved.  Strengthening: Resisted External Rotation    Hold tubing in right hand, elbow at side and forearm across body. Rotate forearm out. Repeat ____ times per set. Do ____ sets per session. Do ____ sessions per day.  http://orth.exer.us/829   Copyright  VHI. All rights reserved.  Strengthening: Resisted Internal Rotation    Hold tubing in left hand, elbow at side and forearm out. Rotate forearm in across body. Repeat ____ times per set. Do ____ sets per session. Do ____ sessions per day.  http://orth.exer.us/831   Copyright  VHI. All rights reserved.  ROM: Abduction (Standing)   Bring arms straight out from sides and raise as high as possible without pain. Repeat ____ times per set. Do ____ sets per session. Do ____ sessions per day.  http://orth.exer.us/910   Copyright  VHI. All rights reserved.   Extension (Active) ROM: Extension (Standing)   Bring arms straight back as far as possible without pain. Repeat ____ times per set. Do ____ sets per session. Do ____ sessions per day.  http://orth.exer.us/916   Copyright  VHI. All rights reserved.   ROM: External / Internal Rotation - in Abduction (Standing)   With  upper arms parallel to floor and elbows bent at right angles, gently rotate arms up then down as far as possible without pain. Repeat ____ times per set. Do ____ sets per session. Do ____ sessions per day.  http://orth.exer.us/912   Copyright  VHI. All rights reserved.    Flexors Stretch (Active)   Stand, arms straight at sides. Bring arms straight forward and upward as high as possible without pain. Hold ___ seconds. Repeat ___ times per session. Do ___ sessions per day.  Copyright  VHI. All rights reserved.   Scapular Retraction (Standing)   With arms at sides, pinch shoulder blades together. Repeat ____ times per set. Do ____ sets per session. Do ____ sessions per day.  http://orth.exer.us/944   Copyright  VHI. All rights reserved.

## 2016-03-28 NOTE — Therapy (Signed)
[place], [place], [address] Phone: [phone]   Fax:  [phone]  Occupational Therapy Treatment  Patient Details  Name: Pamela Yang MRN: [id number] Date of Birth: 06-Oct-1951 Referring Provider: Dr. Sanjuana Kava  Encounter Date: 03/28/2016      OT End of Session - 03/28/16 1520    Visit Number 7   Number of Visits 12   Date for OT Re-Evaluation 04/18/16   Authorization Type BCBS Medicare    Authorization Time Period before 10th visit   Authorization - Visit Number 7   Authorization - Number of Visits 10   OT Start Time 5176   OT Stop Time 1515   OT Time Calculation (min) 42 min   Activity Tolerance Patient tolerated treatment well   Behavior During Therapy [facility] for tasks assessed/performed      Past Medical History  Diagnosis Date  . Gastroparesis 5/08    Dr. Stann Mainland  . Hearing loss   . GERD (gastroesophageal reflux disease)   . Osteoporosis   . GAD (generalized anxiety disorder)     Past Surgical History  Procedure Laterality Date  . Colonoscopy  6/12    Dr Laural Golden    There were no vitals filed for this visit.      Subjective Assessment - 03/28/16 1518    Subjective  S:  I am ready for discharge, I am doing great and can do the exercises on my own.   Currently in Pain? Yes   Pain Score 5    Pain Location Shoulder   Pain Orientation Left   Pain Descriptors / Indicators Aching   Pain Type Acute pain   Pain Radiating Towards with internal rotation only   Pain Onset More than a month ago   Pain Frequency Intermittent   Aggravating Factors  reaching behind back or into internal rotation causes sharp pain that dissipates as soon as movment stops   Pain Relieving Factors rest   Effect of Pain on Daily Activities limits full use of left arm            [facility] OT Assessment - 03/28/16 0001    Assessment   Diagnosis Left Shoulder Pain   Onset Date --  6 months ago   Precautions   Precautions None   Restrictions   Weight Bearing Restrictions No   Prior Function   Level of Independence Independent   Vocation Retired   Leisure enjoys reading, exercising, volunteering and caring for her husband   ADL   ADL comments patient is able to complete all daily activities, except reaching behind back to fasten her bra   Written Expression   Dominant Hand Right   Observation/Other Assessments   Focus on Therapeutic Outcomes (FOTO)  71/100 was 62/100   AROM   Overall AROM Comments assessed in standing, external rotation and internal rotation with shoulder abducted to 90 (03/07/16)   Left Shoulder Flexion 145 Degrees  110   Left Shoulder ABduction 167 Degrees  150   Left Shoulder Internal Rotation 65 Degrees  60   Left Shoulder External Rotation 85 Degrees  84   PROM   Overall PROM Comments WFL except for flexion   Left Shoulder Flexion 155 Degrees  150   Strength   Overall Strength Comments assessed in standing External rotation and internal rotation with shoulder abducted to 90  scapualr stability is good - on left shoulder with visible winging of the left shoulder blade  Left Shoulder Flexion 5/5   Left Shoulder Extension 5/5   Left Shoulder ABduction 5/5   Left Shoulder Internal Rotation 5/5                  OT Treatments/Exercises (OP) - 03/28/16 0001    Shoulder Exercises: Supine   Protraction PROM;5 reps   Horizontal ABduction PROM;5 reps   External Rotation PROM;5 reps   Internal Rotation PROM;5 reps   Flexion PROM;5 reps   ABduction PROM;5 reps   Manual Therapy   Manual Therapy Myofascial release   Manual therapy comments manual therapy completed seperately from all other interventions this date.    Myofascial Release myofascial release and manual stretching to left upper arm, scapular, and shoulder region to decrease pain and restrictions and improve pain free mobility   added manual cervical traction this date for decreased cervical pain                 OT Education - 03/28/16 1520    Education provided Yes   Education Details A/ROM and scapular theraband   Person(s) Educated Patient   Methods Explanation;Demonstration;Handout   Comprehension Verbalized understanding;Returned demonstration          OT Short Term Goals - 03/28/16 1456    OT SHORT TERM GOAL #1   Title Patient will be educated on a HEP for improved A/ROM in left shoulder needed to return to reaching into back seat of car.    Time 3   Period Weeks   Status Achieved   OT SHORT TERM GOAL #2   Title Patient will improve P/ROM of left shoulder flexion to WNL for increased abiilty to reach overhead.    Time 3   Period Weeks   Status Partially Met   OT SHORT TERM GOAL #3   Title Patient will decrease scapular winging by 50% for improved posture and better shoulder alignment and less pain.    Time 3   Period Weeks   Status Achieved   OT SHORT TERM GOAL #4   Title Patient will have 7/10 pain in her left shoulder or better when completing activities involving reaching behind her back.    Time 3   Period Weeks   Status Achieved   OT SHORT TERM GOAL #5   Status On-going           OT Long Term Goals - 03/28/16 1457    OT LONG TERM GOAL #1   Title Patient will use left arm with all B/IADLS and leisure activities without pain and with full movement.    Baseline partially - difficulty reaching behind back    Time 6   Period Weeks   Status Partially Met   OT LONG TERM GOAL #2   Title Patient will have WNL A/ROM in her left shoulder for improved abiilty to fasten bra and reach into back seat of car.    Baseline can reach the back seat of the car, not her back   Time 6   Period Weeks   Status Partially Met   OT LONG TERM GOAL #3   Title Patient will have WNL scapular stability in her left shoulder for improved posture and less left shoulder pain.    Time 6   Period Weeks   Status Achieved   OT LONG TERM GOAL #4   Title Patient will have  3/10 pain or less in her left shoulder when completing functional activities that involve internal rotation of shoulder.  Baseline currently 5/10   Time 6   Period Weeks   Status Partially Met   OT LONG TERM GOAL #5   Title Patient will have trace fascial restrictions in her left shoulder region for greater pain free mobility.    Time 6   Period Weeks   Status Achieved               Plan - 04/03/2016 1652    Clinical Impression Statement A: Patient has met or partially met all OT goals. She has significantly improved her A/ROM and her scapular stability. Her primary deficit is internal rotation combined with shoulder abduction. Patient is being discharged from skilled OT intervention this date and will continue improving herA/ROM independenlty at home.    OT Frequency 2x / week   OT Duration 4 weeks   OT Treatment/Interventions Self-care/ADL training;Cryotherapy;Electrical Stimulation;Moist Heat;Ultrasound;Therapeutic exercise;Neuromuscular education;DME and/or AE instruction;Passive range of motion;Manual Therapy;Therapeutic exercises;Therapeutic activities;Patient/family education   Plan P: DC from skilled OT intervention this date.   Consulted and Agree with Plan of Care Patient      Patient will benefit from skilled therapeutic intervention in order to improve the following deficits and impairments:  Decreased strength, Decreased range of motion, Increased muscle spasms, Increased fascial restricitons, Pain  Visit Diagnosis: Pain in left shoulder  Stiffness of left shoulder, not elsewhere classified  Muscle weakness (generalized)      G-Codes - 04/03/16 1653    Functional Assessment Tool Used FOTO 71/100 29% impaired    Functional Limitation Carrying, moving and handling objects   Carrying, Moving and Handling Objects Goal Status (H0865) At least 1 percent but less than 20 percent impaired, limited or restricted   Carrying, Moving and Handling Objects Discharge Status  [id number]) At least 20 percent but less than 40 percent impaired, limited or restricted      Problem List Patient Active Problem List   Diagnosis Date Noted  . Hyperlipidemia 12/31/2014  . Osteoporosis 08/13/2014  . SORE THROAT 03/31/2009  . DRY MOUTH 03/31/2009  . GERD 03/31/2009  . GASTROPARESIS 03/31/2009  . CONSTIPATION 03/31/2009  . NAUSEA 03/31/2009  . HEARTBURN 03/31/2009  . OTHER DYSPHAGIA 03/31/2009  . EPIGASTRIC PAIN 03/31/2009  . DEPRESSION, HX OF 03/31/2009    Vangie Bicker, OTR/L [phone]   OCCUPATIONAL THERAPY DISCHARGE SUMMARY  Visits from Start of Care: 7  Current functional level related to goals / functional outcomes: Independent with all functional activities except reaching into internal rotation to fasten her bra.   Remaining deficits: Internal rotation with shoulder abduction causes incrased pain   Education / Equipment: Updated HEP for scapular theraband exercises   Plan: Patient agrees to discharge.  Patient goals were partially met. Patient is being discharged due to being pleased with the current functional level.  ?????       Vangie Bicker, OTR/L [phone]    2016-04-03, 4:54 PM  [facility] [address] [place], [place], [address] Phone: [phone]   Fax:  [phone]  Name: Pamela Yang MRN: [id number] Date of Birth: [birth year]

## 2016-03-30 DIAGNOSIS — M810 Age-related osteoporosis without current pathological fracture: Secondary | ICD-10-CM | POA: Diagnosis not present

## 2016-03-31 ENCOUNTER — Encounter (HOSPITAL_COMMUNITY): Payer: Medicare Other | Admitting: Occupational Therapy

## 2016-04-07 ENCOUNTER — Encounter: Payer: Self-pay | Admitting: Orthopaedic Surgery

## 2016-04-07 ENCOUNTER — Ambulatory Visit (INDEPENDENT_AMBULATORY_CARE_PROVIDER_SITE_OTHER): Payer: Medicare Other | Admitting: Orthopaedic Surgery

## 2016-04-07 VITALS — BP 88/61 | HR 67 | Temp 97.2°F | Ht 64.0 in | Wt 112.0 lb

## 2016-04-07 DIAGNOSIS — M25512 Pain in left shoulder: Secondary | ICD-10-CM | POA: Diagnosis not present

## 2016-04-07 NOTE — Progress Notes (Signed)
Patient [id number] Pamela Yang, female DOB:[birth year], 65 y.o. [id number]  Chief Complaint  Patient presents with  . Follow-up    left shoulder    HPI  Pamela Yang is a 65 y.o. female who has had left shoulder pain.  It is now completely resolved.  She has been to OT/PT and has been doing her exercises.  She is discharged from OT.  She has no pain, no problem and full motion now.  She has no limitations, all treatments are completed.  She is pleased with her progress.  HPI  Body mass index is 19.22 kg/(m^2).  Review of Systems  Constitutional:       She does not smoke.  She is not a diabetic  She does not have hypertension.  She does not have shortness of breath.  HENT: Positive for congestion and hearing loss.   Respiratory: Negative for shortness of breath.   Cardiovascular: Negative for chest pain.  Endocrine: Negative for cold intolerance.  Musculoskeletal: Positive for myalgias and arthralgias.  All other systems reviewed and are negative.   Past Medical History  Diagnosis Date  . Gastroparesis 5/08    Dr. Stann Mainland  . Hearing loss   . GERD (gastroesophageal reflux disease)   . Osteoporosis   . GAD (generalized anxiety disorder)     Past Surgical History  Procedure Laterality Date  . Colonoscopy  6/12    Dr Laural Golden    Family History  Problem Relation Age of Onset  . Heart attack Mother   . Ovarian cancer Sister     Social History Social History  Substance Use Topics  . Smoking status: Never Smoker   . Smokeless tobacco: None  . Alcohol Use: None    No Known Allergies  Current Outpatient Prescriptions  Medication Sig Dispense Refill  . Ascorbic Acid (VITAMIN C PO) Take by mouth. daily    . aspirin 81 MG tablet Take 81 mg by mouth daily.    Marland Kitchen CALCIUM-VITAMIN D PO Take by mouth. bid    . citalopram (CELEXA) 20 MG tablet TAKE ONE (1) TABLET EACH DAY 30 tablet 5  . denosumab (PROLIA) 60 MG/ML SOLN injection Inject 60 mg into the skin every 6 (six)  months. Administer in upper arm, thigh, or abdomen    . Flaxseed, Linseed, (FLAXSEED OIL PO) Take by mouth. 2 daily    . IRON PO Take by mouth. daily    . Multiple Vitamins-Minerals (MULTIVITAMIN WITH MINERALS) tablet Take 1 tablet by mouth daily.    Marland Kitchen omeprazole (PRILOSEC) 20 MG capsule Take 20 mg by mouth daily.     No current facility-administered medications for this visit.     Physical Exam  Blood pressure 88/61, pulse 67, temperature 97.2 F (36.2 C), height 5\' 4"  (1.626 m), weight 112 lb (50.803 kg).  Constitutional: overall normal hygiene, normal nutrition, well developed, normal grooming, normal body habitus. Assistive device:none  Musculoskeletal: gait and station Limp none, muscle tone and strength are normal, no tremors or atrophy is present.  .  Neurological: coordination overall normal.  Deep tendon reflex/nerve stretch intact.  Sensation normal.  Cranial nerves II-XII intact.   Skin:   normal overall no scars, lesions, ulcers or rashes. No psoriasis.  Psychiatric: Alert and oriented x 3.  Recent memory intact, remote memory unclear.  Normal mood and affect. Well groomed.  Good eye contact.  Cardiovascular: overall no swelling, no varicosities, no edema bilaterally, normal temperatures of the legs and arms, no clubbing, cyanosis and good  capillary refill.  Lymphatic: palpation is normal. Right Shoulder Exam  Right shoulder exam is normal.  Range of Motion  The patient has normal right shoulder ROM.  Muscle Strength  The patient has normal right shoulder strength.  Other  Erythema: absent Scars: absent Sensation: normal Pulse: present   Left Shoulder Exam  Left shoulder exam is normal.  Range of Motion  The patient has normal left shoulder ROM.  Muscle Strength  The patient has normal left shoulder strength.  Other  Erythema: absent Scars: absent Sensation: normal Pulse: present       The patient has been educated about the nature of the  problem(s) and counseled on treatment options.  The patient appeared to understand what I have discussed and is in agreement with it.  Encounter Diagnosis  Name Primary?  . Left shoulder pain Yes    PLAN Call if any problems. Her shoulder pain is resolved and fully recovered.  No further treatments planned.  She has no limitations.   Return to clinic as needed.

## 2016-04-07 NOTE — Patient Instructions (Signed)
No further treatment needed, fully recovered, no restrictions

## 2016-04-19 DIAGNOSIS — M810 Age-related osteoporosis without current pathological fracture: Secondary | ICD-10-CM | POA: Diagnosis not present

## 2016-08-12 ENCOUNTER — Telehealth: Payer: Self-pay | Admitting: Nurse Practitioner

## 2016-08-12 DIAGNOSIS — R5383 Other fatigue: Secondary | ICD-10-CM

## 2016-08-12 DIAGNOSIS — Z79899 Other long term (current) drug therapy: Secondary | ICD-10-CM

## 2016-08-12 DIAGNOSIS — E785 Hyperlipidemia, unspecified: Secondary | ICD-10-CM

## 2016-08-12 NOTE — Telephone Encounter (Signed)
Pt is requesting lab orders to be sent over for an upcoming wellness visit. Last labs per epic were: tsh,bmp,hepatic and lipid on 08/22/15.

## 2016-08-17 NOTE — Telephone Encounter (Signed)
CBC, metabolic 7, lipid, liver, TSH

## 2016-08-17 NOTE — Telephone Encounter (Signed)
Patient called to check on this message.  She is hoping these can be ordered ASAP for appointment next week.

## 2016-08-17 NOTE — Telephone Encounter (Signed)
Blood work ordered in EPIC. Patient notified. 

## 2016-08-19 DIAGNOSIS — E785 Hyperlipidemia, unspecified: Secondary | ICD-10-CM | POA: Diagnosis not present

## 2016-08-19 DIAGNOSIS — R5383 Other fatigue: Secondary | ICD-10-CM | POA: Diagnosis not present

## 2016-08-19 DIAGNOSIS — Z79899 Other long term (current) drug therapy: Secondary | ICD-10-CM | POA: Diagnosis not present

## 2016-08-20 LAB — CBC WITH DIFFERENTIAL/PLATELET
Basophils Absolute: 0 10*3/uL (ref 0.0–0.2)
Basos: 1 %
EOS (ABSOLUTE): 0.2 10*3/uL (ref 0.0–0.4)
Eos: 4 %
Hematocrit: 39.6 % (ref 34.0–46.6)
Hemoglobin: 12.8 g/dL (ref 11.1–15.9)
Immature Grans (Abs): 0 10*3/uL (ref 0.0–0.1)
Immature Granulocytes: 0 %
LYMPHS ABS: 2.1 10*3/uL (ref 0.7–3.1)
Lymphs: 41 %
MCH: 27.5 pg (ref 26.6–33.0)
MCHC: 32.3 g/dL (ref 31.5–35.7)
MCV: 85 fL (ref 79–97)
MONOCYTES: 8 %
Monocytes Absolute: 0.4 10*3/uL (ref 0.1–0.9)
Neutrophils Absolute: 2.3 10*3/uL (ref 1.4–7.0)
Neutrophils: 46 %
Platelets: 167 10*3/uL (ref 150–379)
RBC: 4.66 x10E6/uL (ref 3.77–5.28)
RDW: 15.2 % (ref 12.3–15.4)
WBC: 5 10*3/uL (ref 3.4–10.8)

## 2016-08-20 LAB — LIPID PANEL
Chol/HDL Ratio: 3.2 ratio units (ref 0.0–4.4)
Cholesterol, Total: 225 mg/dL — ABNORMAL HIGH (ref 100–199)
HDL: 71 mg/dL (ref >39)
LDL Calculated: 138 mg/dL — ABNORMAL HIGH (ref 0–99)
Triglycerides: 81 mg/dL (ref 0–149)
VLDL Cholesterol Cal: 16 mg/dL (ref 5–40)

## 2016-08-20 LAB — HEPATIC FUNCTION PANEL
ALT: 15 IU/L (ref 0–32)
AST: 20 IU/L (ref 0–40)
Albumin: 4.5 g/dL (ref 3.6–4.8)
Alkaline Phosphatase: 54 IU/L (ref 39–117)
BILIRUBIN, DIRECT: 0.09 mg/dL (ref 0.00–0.40)
Bilirubin Total: 0.3 mg/dL (ref 0.0–1.2)
TOTAL PROTEIN: 7 g/dL (ref 6.0–8.5)

## 2016-08-20 LAB — BASIC METABOLIC PANEL
BUN / CREAT RATIO: 20 (ref 12–28)
BUN: 18 mg/dL (ref 8–27)
CO2: 27 mmol/L (ref 18–29)
CREATININE: 0.92 mg/dL (ref 0.57–1.00)
Calcium: 9.8 mg/dL (ref 8.7–10.3)
Chloride: 103 mmol/L (ref 96–106)
GFR calc Af Amer: 75 mL/min/{1.73_m2} (ref >59)
GFR calc non Af Amer: 65 mL/min/{1.73_m2} (ref >59)
Glucose: 93 mg/dL (ref 65–99)
Potassium: 4.5 mmol/L (ref 3.5–5.2)
Sodium: 144 mmol/L (ref 134–144)

## 2016-08-20 LAB — TSH: TSH: 3.62 u[IU]/mL (ref 0.450–4.500)

## 2016-08-25 ENCOUNTER — Encounter: Payer: Self-pay | Admitting: Nurse Practitioner

## 2016-08-25 ENCOUNTER — Ambulatory Visit (INDEPENDENT_AMBULATORY_CARE_PROVIDER_SITE_OTHER): Payer: Medicare Other | Admitting: Nurse Practitioner

## 2016-08-25 VITALS — BP 116/70 | Ht 64.0 in | Wt 110.5 lb

## 2016-08-25 DIAGNOSIS — Z23 Encounter for immunization: Secondary | ICD-10-CM | POA: Diagnosis not present

## 2016-08-25 DIAGNOSIS — Z139 Encounter for screening, unspecified: Secondary | ICD-10-CM | POA: Diagnosis not present

## 2016-08-25 MED ORDER — NYSTATIN 100000 UNIT/GM EX CREA: 1.0000 "application " | TOPICAL_CREAM | Freq: Two times a day (BID) | CUTANEOUS | 0 refills | Status: DC

## 2016-08-25 NOTE — Progress Notes (Signed)
   Subjective:    Patient ID: Pamela Yang, female    DOB: 07/12/1951, 65 y.o.   MRN: [id number]  HPI presents for her wellness exam. No bleeding or pelvic pain. Same sexual partner. Sees Dr. Chalmers Cater for her osteoporosis infusions. Regular dental and vision exams. Last PAP 2 years ago with GYN was normal. Regular exercise.     Review of Systems  Constitutional: Negative for activity change, appetite change and fatigue.  HENT: Negative for dental problem, ear pain, sinus pressure and sore throat.   Respiratory: Negative for cough, chest tightness, shortness of breath and wheezing.   Cardiovascular: Negative for chest pain.  Gastrointestinal: Negative for abdominal distention, abdominal pain, constipation, diarrhea, nausea and vomiting.  Genitourinary: Negative for difficulty urinating, dysuria, enuresis, frequency, genital sores, pelvic pain, urgency and vaginal discharge.  Psychiatric/Behavioral:       Depression stable on Celexa.        Objective:   Physical Exam  Constitutional: She is oriented to person, place, and time. She appears well-developed. No distress.  HENT:  Right Ear: External ear normal.  Left Ear: External ear normal.  Mouth/Throat: Oropharynx is clear and moist.  Neck: Normal range of motion. Neck supple. No tracheal deviation present. No thyromegaly present.  Cardiovascular: Normal rate, regular rhythm and normal heart sounds.  Exam reveals no gallop.   No murmur heard. Pulmonary/Chest: Effort normal and breath sounds normal.  Abdominal: Soft. She exhibits no distension. There is no tenderness.  Genitourinary: Vagina normal and uterus normal. No vaginal discharge found.  Genitourinary Comments: External GU: mild shiny erythema right labia. No lesions. Vagina: no discharge. Mucosa pale. No CMT. Bimanual exam: no tenderness or obvious masses. Rectal exam: no masses; no stool for hemoccult.   Musculoskeletal: She exhibits no edema.  Lymphadenopathy:    She has no  cervical adenopathy.  Neurological: She is alert and oriented to person, place, and time.  Skin: Skin is warm and dry. No rash noted.  Psychiatric: She has a normal mood and affect. Her behavior is normal.  Vitals reviewed. Breast exam: no masses; axillae no adenopathy.  Reviewed labs with patient from 08/19/16.       Assessment & Plan:  Need for vaccination - Plan: Flu Vaccine QUAD 36+ mos IM (Fluarix & Fluzone Quad PF  Screening - Plan: POC Hemoccult Bld/Stl (3-Cd Home Screen)  Meds ordered this encounter  Medications  . nystatin cream (MYCOSTATIN)    Sig: Apply 1 application topically 2 (two) times daily.    Dispense:  30 g    Refill:  0    Order Specific Question:   Supervising Provider    Answer:   Mikey Kirschner [[id number]]   Continue vitamin D and calcium and daily ASA.  Return in about 1 year (around 08/25/2017) for physical.

## 2016-09-05 ENCOUNTER — Other Ambulatory Visit: Payer: Self-pay | Admitting: *Deleted

## 2016-09-05 DIAGNOSIS — Z139 Encounter for screening, unspecified: Secondary | ICD-10-CM

## 2016-09-05 LAB — POC HEMOCCULT BLD/STL (HOME/3-CARD/SCREEN)
CARD #2 DATE: 9112017
CARD #3 DATE: 9132017
Card #2 Fecal Occult Blod, POC: NEGATIVE
FECAL OCCULT BLD: NEGATIVE
FECAL OCCULT BLD: NEGATIVE
OCCULT BLOOD DATE: 9102017

## 2016-09-26 DIAGNOSIS — E559 Vitamin D deficiency, unspecified: Secondary | ICD-10-CM | POA: Diagnosis not present

## 2016-09-26 DIAGNOSIS — M810 Age-related osteoporosis without current pathological fracture: Secondary | ICD-10-CM | POA: Diagnosis not present

## 2016-10-24 DIAGNOSIS — M810 Age-related osteoporosis without current pathological fracture: Secondary | ICD-10-CM | POA: Diagnosis not present

## 2016-11-23 DIAGNOSIS — Z961 Presence of intraocular lens: Secondary | ICD-10-CM | POA: Diagnosis not present

## 2016-11-23 DIAGNOSIS — H04123 Dry eye syndrome of bilateral lacrimal glands: Secondary | ICD-10-CM | POA: Diagnosis not present

## 2016-11-23 DIAGNOSIS — H43811 Vitreous degeneration, right eye: Secondary | ICD-10-CM | POA: Diagnosis not present

## 2016-11-30 ENCOUNTER — Other Ambulatory Visit: Payer: Self-pay | Admitting: Family Medicine

## 2016-11-30 ENCOUNTER — Other Ambulatory Visit: Payer: Self-pay | Admitting: *Deleted

## 2016-11-30 MED ORDER — CITALOPRAM HYDROBROMIDE 20 MG PO TABS: ORAL_TABLET | ORAL | 5 refills | Status: DC

## 2017-01-23 ENCOUNTER — Ambulatory Visit (HOSPITAL_COMMUNITY)
Admission: RE | Admit: 2017-01-23 | Discharge: 2017-01-23 | Disposition: A | Discharge status: Home / Self Care | Payer: Medicare Other | Source: Ambulatory Visit | Attending: Nurse Practitioner | Admitting: Nurse Practitioner

## 2017-01-23 ENCOUNTER — Encounter (HOSPITAL_COMMUNITY): Payer: Self-pay

## 2017-01-23 DIAGNOSIS — Z1231 Encounter for screening mammogram for malignant neoplasm of breast: Secondary | ICD-10-CM

## 2017-04-11 DIAGNOSIS — M810 Age-related osteoporosis without current pathological fracture: Secondary | ICD-10-CM | POA: Diagnosis not present

## 2017-04-11 DIAGNOSIS — E559 Vitamin D deficiency, unspecified: Secondary | ICD-10-CM | POA: Diagnosis not present

## 2017-04-24 DIAGNOSIS — M810 Age-related osteoporosis without current pathological fracture: Secondary | ICD-10-CM | POA: Diagnosis not present

## 2017-06-19 ENCOUNTER — Other Ambulatory Visit: Payer: Self-pay | Admitting: Family Medicine

## 2017-06-23 ENCOUNTER — Telehealth: Payer: Self-pay | Admitting: Nurse Practitioner

## 2017-06-23 DIAGNOSIS — Z1159 Encounter for screening for other viral diseases: Secondary | ICD-10-CM

## 2017-06-23 DIAGNOSIS — Z79899 Other long term (current) drug therapy: Secondary | ICD-10-CM

## 2017-06-23 DIAGNOSIS — Z13220 Encounter for screening for lipoid disorders: Secondary | ICD-10-CM

## 2017-06-23 NOTE — Telephone Encounter (Signed)
Patient has an appointment on 08/29/17 with Hoyle Sauer.  She is requesting lab work, also to remind Hoyle Sauer to include Hep C screening.

## 2017-06-29 NOTE — Telephone Encounter (Signed)
Orders put in. Pt notified on voicemail   

## 2017-06-29 NOTE — Telephone Encounter (Signed)
Please order lipid, liver, Met 7 and hep C screening. Thanks.

## 2017-07-08 DIAGNOSIS — Z1159 Encounter for screening for other viral diseases: Secondary | ICD-10-CM | POA: Diagnosis not present

## 2017-07-08 DIAGNOSIS — Z79899 Other long term (current) drug therapy: Secondary | ICD-10-CM | POA: Diagnosis not present

## 2017-07-08 DIAGNOSIS — Z13220 Encounter for screening for lipoid disorders: Secondary | ICD-10-CM | POA: Diagnosis not present

## 2017-07-09 LAB — BASIC METABOLIC PANEL WITH GFR
BUN/Creatinine Ratio: 19 (ref 12–28)
BUN: 18 mg/dL (ref 8–27)
CO2: 25 mmol/L (ref 20–29)
Calcium: 9.8 mg/dL (ref 8.7–10.3)
Chloride: 102 mmol/L (ref 96–106)
Creatinine, Ser: 0.95 mg/dL (ref 0.57–1.00)
GFR calc Af Amer: 71 mL/min/1.73
GFR calc non Af Amer: 62 mL/min/1.73
Glucose: 78 mg/dL (ref 65–99)
Potassium: 4.4 mmol/L (ref 3.5–5.2)
Sodium: 142 mmol/L (ref 134–144)

## 2017-07-09 LAB — HEPATIC FUNCTION PANEL
ALT: 15 IU/L (ref 0–32)
AST: 23 IU/L (ref 0–40)
Albumin: 4.6 g/dL (ref 3.6–4.8)
Alkaline Phosphatase: 56 IU/L (ref 39–117)
Bilirubin Total: 0.6 mg/dL (ref 0.0–1.2)
Bilirubin, Direct: 0.14 mg/dL (ref 0.00–0.40)
Total Protein: 6.9 g/dL (ref 6.0–8.5)

## 2017-07-09 LAB — LIPID PANEL
CHOLESTEROL TOTAL: 199 mg/dL (ref 100–199)
Chol/HDL Ratio: 2.8 ratio (ref 0.0–4.4)
HDL: 70 mg/dL (ref >39)
LDL CALC: 115 mg/dL — AB (ref 0–99)
Triglycerides: 69 mg/dL (ref 0–149)
VLDL CHOLESTEROL CAL: 14 mg/dL (ref 5–40)

## 2017-07-09 LAB — HEPATITIS C ANTIBODY

## 2017-08-22 DIAGNOSIS — M810 Age-related osteoporosis without current pathological fracture: Secondary | ICD-10-CM | POA: Diagnosis not present

## 2017-08-22 DIAGNOSIS — E559 Vitamin D deficiency, unspecified: Secondary | ICD-10-CM | POA: Diagnosis not present

## 2017-08-23 ENCOUNTER — Other Ambulatory Visit: Payer: Self-pay | Admitting: *Deleted

## 2017-08-23 NOTE — Telephone Encounter (Signed)
1 refill 

## 2017-08-24 MED ORDER — CITALOPRAM HYDROBROMIDE 20 MG PO TABS: 20.0000 mg | ORAL_TABLET | Freq: Every day | ORAL | 0 refills | Status: DC

## 2017-08-28 DIAGNOSIS — M810 Age-related osteoporosis without current pathological fracture: Secondary | ICD-10-CM | POA: Diagnosis not present

## 2017-08-29 ENCOUNTER — Encounter: Payer: Medicare Other | Admitting: Nurse Practitioner

## 2017-08-30 ENCOUNTER — Ambulatory Visit (INDEPENDENT_AMBULATORY_CARE_PROVIDER_SITE_OTHER): Payer: Medicare Other | Admitting: Nurse Practitioner

## 2017-08-30 ENCOUNTER — Encounter: Payer: Self-pay | Admitting: Nurse Practitioner

## 2017-08-30 VITALS — BP 112/72 | Ht 64.0 in | Wt 110.4 lb

## 2017-08-30 DIAGNOSIS — Z01419 Encounter for gynecological examination (general) (routine) without abnormal findings: Secondary | ICD-10-CM

## 2017-08-30 DIAGNOSIS — Z1151 Encounter for screening for human papillomavirus (HPV): Secondary | ICD-10-CM | POA: Diagnosis not present

## 2017-08-30 DIAGNOSIS — Z124 Encounter for screening for malignant neoplasm of cervix: Secondary | ICD-10-CM

## 2017-08-30 DIAGNOSIS — Z23 Encounter for immunization: Secondary | ICD-10-CM | POA: Diagnosis not present

## 2017-08-30 MED ORDER — CITALOPRAM HYDROBROMIDE 20 MG PO TABS: 20.0000 mg | ORAL_TABLET | Freq: Every day | ORAL | 1 refills | Status: DC

## 2017-08-30 NOTE — Progress Notes (Signed)
Subjective:    Patient ID: Pamela Yang, female    DOB: [birth year], 66 y.o.   MRN: [id number]  HPI presents for her wellness exam. No vaginal bleeding or pelvic pain. Same sexual partner. Seeing Dr. Chalmers Cater for osteoporosis. On Prolia. Getting regular bone density studies. Doing well. Normal vitamin D level. Takes daily supplements. Regular vision and dental exams. Tries to work out about 3 times per week. Depression stable. Doing well on Citalopram. Providing care for her husband. States she has a history of cardiac murmur only with lying down.  Depression screen [facility] 2/9 08/30/2017 08/22/2015 12/31/2014  Decreased Interest 1 0 0  Down, Depressed, Hopeless 1 0 0  PHQ - 2 Score 2 0 0  Altered sleeping 3 - -  Tired, decreased energy 0 - -  Change in appetite 0 - -  Feeling bad or failure about yourself  0 - -  Trouble concentrating 0 - -  Moving slowly or fidgety/restless 0 - -  Suicidal thoughts 0 - -  PHQ-9 Score 5 - -       Review of Systems  Constitutional: Negative for activity change, appetite change and fatigue.  HENT: Positive for hearing loss. Negative for dental problem, ear pain, sinus pressure and sore throat.        Wears hearing aid right ear  Respiratory: Negative for cough, chest tightness, shortness of breath and wheezing.   Cardiovascular: Negative for chest pain and leg swelling.  Gastrointestinal: Negative for abdominal distention, abdominal pain, blood in stool, constipation, diarrhea, nausea and vomiting.  Genitourinary: Negative for difficulty urinating, dysuria, enuresis, frequency, genital sores, pelvic pain, urgency, vaginal bleeding and vaginal discharge.       Objective:   Physical Exam  Constitutional: She is oriented to person, place, and time. She appears well-developed. No distress.  HENT:  Right Ear: External ear normal.  Left Ear: External ear normal.  Mouth/Throat: Oropharynx is clear and moist.  Neck: Normal range of motion. Neck supple. No  tracheal deviation present. No thyromegaly present.  Cardiovascular: Normal rate, regular rhythm and normal heart sounds.  Exam reveals no gallop.   No murmur heard. No murmur noted with change of position. Carotids no bruits or thrills.   Pulmonary/Chest: Effort normal and breath sounds normal.  Abdominal: Soft. She exhibits no distension. There is no tenderness.  Genitourinary: Vagina normal and uterus normal. No vaginal discharge found.  Genitourinary Comments: External GU: no rashes or lesions. Vagina: pale, no discharge. Significant scarring at os. PAP obtained but limited. Bimanual exam: no tenderness or obvious masses.   Musculoskeletal: She exhibits no edema.  Lymphadenopathy:    She has no cervical adenopathy.  Neurological: She is alert and oriented to person, place, and time.  Skin: Skin is warm and dry. No rash noted.  Psychiatric: She has a normal mood and affect. Her behavior is normal.  Vitals reviewed. Breast exam: areas of dense tissue with mild nodularity; no dominant masses; axillae no adenopathy.         Assessment & Plan:  Well woman exam - Plan: Pap IG and HPV (high risk) DNA detection  Screening for cervical cancer - Plan: Pap IG and HPV (high risk) DNA detection  Screening for HPV (human papillomavirus) - Plan: Pap IG and HPV (high risk) DNA detection  Need for vaccination - Plan: Flu Vaccine QUAD 36+ mos IM  Continue follow up with Dr. Chalmers Cater for osteoporosis. Flu vaccine today. Given Rx for Shingrix. Encouraged continued activity especially weight  bearing exercises.  Meds ordered this encounter  Medications  . citalopram (CELEXA) 20 MG tablet    Sig: Take 1 tablet (20 mg total) by mouth daily.    Dispense:  90 tablet    Refill:  1    Order Specific Question:   Supervising Provider    Answer:   Mikey Kirschner [[id number]]     Return in about 6 months (around 02/27/2018) for recheck.

## 2017-09-01 LAB — PAP IG AND HPV HIGH-RISK
HPV, HIGH-RISK: NEGATIVE
PAP SMEAR COMMENT: 0

## 2017-10-26 DIAGNOSIS — M810 Age-related osteoporosis without current pathological fracture: Secondary | ICD-10-CM | POA: Diagnosis not present

## 2017-12-08 DIAGNOSIS — H43811 Vitreous degeneration, right eye: Secondary | ICD-10-CM | POA: Diagnosis not present

## 2017-12-08 DIAGNOSIS — H04123 Dry eye syndrome of bilateral lacrimal glands: Secondary | ICD-10-CM | POA: Diagnosis not present

## 2017-12-08 DIAGNOSIS — Z961 Presence of intraocular lens: Secondary | ICD-10-CM | POA: Diagnosis not present

## 2017-12-08 DIAGNOSIS — H43392 Other vitreous opacities, left eye: Secondary | ICD-10-CM | POA: Diagnosis not present

## 2018-01-12 ENCOUNTER — Ambulatory Visit: Payer: Medicare Other | Admitting: Nurse Practitioner

## 2018-02-06 ENCOUNTER — Other Ambulatory Visit: Payer: Self-pay | Admitting: Nurse Practitioner

## 2018-02-06 DIAGNOSIS — Z1231 Encounter for screening mammogram for malignant neoplasm of breast: Secondary | ICD-10-CM

## 2018-02-08 ENCOUNTER — Encounter (HOSPITAL_COMMUNITY): Payer: Self-pay

## 2018-02-08 ENCOUNTER — Ambulatory Visit (HOSPITAL_COMMUNITY)
Admission: RE | Admit: 2018-02-08 | Discharge: 2018-02-08 | Disposition: A | Discharge status: Home / Self Care | Payer: Medicare Other | Source: Ambulatory Visit | Attending: Nurse Practitioner | Admitting: Nurse Practitioner

## 2018-02-08 DIAGNOSIS — Z1231 Encounter for screening mammogram for malignant neoplasm of breast: Secondary | ICD-10-CM

## 2018-02-28 ENCOUNTER — Ambulatory Visit (INDEPENDENT_AMBULATORY_CARE_PROVIDER_SITE_OTHER): Payer: Medicare Other | Admitting: Nurse Practitioner

## 2018-02-28 ENCOUNTER — Encounter: Payer: Self-pay | Admitting: Nurse Practitioner

## 2018-02-28 VITALS — BP 110/72 | Ht 64.0 in | Wt 110.0 lb

## 2018-02-28 DIAGNOSIS — F418 Other specified anxiety disorders: Secondary | ICD-10-CM | POA: Diagnosis not present

## 2018-02-28 MED ORDER — CITALOPRAM HYDROBROMIDE 20 MG PO TABS: 20.0000 mg | ORAL_TABLET | Freq: Every day | ORAL | 1 refills | Status: DC

## 2018-02-28 NOTE — Progress Notes (Signed)
   Subjective:    Patient ID: Pamela Yang, female    DOB: [birth year], 67 y.o.   MRN: [id number]  HPI  Patient is here today to follow up on depression,Sheis currently taking celexa 20 mg.She states she still has some down times, but she feels like the medication is working for her. 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 Has some time to herself each am. Good support system. When she feels overwhelmed or down, she contacts them. Sleeping well. Eats regular meals plus snacks. Is not going to work out, feels it is hard getting back to her routine.  Depression screen [facility] 2/9 02/28/2018 08/30/2017 08/22/2015 12/31/2014  Decreased Interest 1 1 0 0  Down, Depressed, Hopeless 1 1 0 0  PHQ - 2 Score 2 2 0 0  Altered sleeping 1 3 - -  Tired, decreased energy 1 0 - -  Change in appetite 1 0 - -  Feeling bad or failure about yourself  0 0 - -  Trouble concentrating 0 0 - -  Moving slowly or fidgety/restless 0 0 - -  Suicidal thoughts 0 0 - -  PHQ-9 Score 5 5 - -  Difficult doing work/chores Not difficult at all - - -     Review of Systems     Objective:   Physical Exam NAD. Alert, oriented. Lungs clear. Heart RRR. Thoughts logical, coherent and relevant. Dressed appropriately. Making good eye contact. Cheerful, calm affect.        Assessment & Plan:   Problem List Items Addressed This Visit      Other   Depression with anxiety - Primary   Relevant Medications   citalopram (CELEXA) 20 MG tablet     Meds ordered this encounter  Medications  . citalopram (CELEXA) 20 MG tablet    Sig: Take 1 tablet (20 mg total) by mouth daily.    Dispense:  90 tablet    Refill:  1    Order Specific Question:   Supervising Provider    Answer:   Mikey Kirschner [[id number]]   Continue Celexa as directed. Recommend regular activity such as walking. Osteoporosis being managed by her endocrinologist.  Return in about 6 months (around 08/31/2018) for physical. Call back sooner if needed.

## 2018-04-18 DIAGNOSIS — Z029 Encounter for administrative examinations, unspecified: Secondary | ICD-10-CM

## 2018-04-20 ENCOUNTER — Other Ambulatory Visit: Payer: Self-pay | Admitting: Nurse Practitioner

## 2018-04-20 ENCOUNTER — Encounter: Payer: Self-pay | Admitting: Nurse Practitioner

## 2018-04-26 DIAGNOSIS — M810 Age-related osteoporosis without current pathological fracture: Secondary | ICD-10-CM | POA: Diagnosis not present

## 2018-08-15 ENCOUNTER — Telehealth: Payer: Self-pay | Admitting: Family Medicine

## 2018-08-15 NOTE — Telephone Encounter (Signed)
Hyperlipidemia- check lipid Met 7- high risk med

## 2018-08-15 NOTE — Telephone Encounter (Signed)
Pt has PE appt here 09/04/18, needs labs ordered  Please call pt when orders are ready

## 2018-08-15 NOTE — Telephone Encounter (Signed)
Last labs Hep C antibody, BMP, Hepatic, Lipid on 07/08/17

## 2018-08-16 ENCOUNTER — Other Ambulatory Visit: Payer: Self-pay | Admitting: Family Medicine

## 2018-08-16 DIAGNOSIS — Z79899 Other long term (current) drug therapy: Secondary | ICD-10-CM

## 2018-08-16 DIAGNOSIS — Z13220 Encounter for screening for lipoid disorders: Secondary | ICD-10-CM

## 2018-08-16 NOTE — Telephone Encounter (Signed)
Lab orders placed and pt notified.

## 2018-08-22 DIAGNOSIS — Z79899 Other long term (current) drug therapy: Secondary | ICD-10-CM | POA: Diagnosis not present

## 2018-08-22 DIAGNOSIS — Z13220 Encounter for screening for lipoid disorders: Secondary | ICD-10-CM | POA: Diagnosis not present

## 2018-08-23 LAB — BASIC METABOLIC PANEL
BUN/Creatinine Ratio: 26 (ref 12–28)
BUN: 21 mg/dL (ref 8–27)
CO2: 25 mmol/L (ref 20–29)
CREATININE: 0.82 mg/dL (ref 0.57–1.00)
Calcium: 10.1 mg/dL (ref 8.7–10.3)
Chloride: 101 mmol/L (ref 96–106)
GFR calc Af Amer: 84 mL/min/{1.73_m2} (ref >59)
GFR calc non Af Amer: 73 mL/min/{1.73_m2} (ref >59)
GLUCOSE: 90 mg/dL (ref 65–99)
Potassium: 3.9 mmol/L (ref 3.5–5.2)
Sodium: 138 mmol/L (ref 134–144)

## 2018-08-23 LAB — LIPID PANEL
CHOLESTEROL TOTAL: 187 mg/dL (ref 100–199)
Chol/HDL Ratio: 2.6 ratio (ref 0.0–4.4)
HDL: 72 mg/dL (ref >39)
LDL CALC: 100 mg/dL — AB (ref 0–99)
TRIGLYCERIDES: 76 mg/dL (ref 0–149)
VLDL CHOLESTEROL CAL: 15 mg/dL (ref 5–40)

## 2018-08-28 DIAGNOSIS — M810 Age-related osteoporosis without current pathological fracture: Secondary | ICD-10-CM | POA: Diagnosis not present

## 2018-08-28 DIAGNOSIS — Z23 Encounter for immunization: Secondary | ICD-10-CM | POA: Diagnosis not present

## 2018-08-31 ENCOUNTER — Encounter: Payer: Medicare Other | Admitting: Nurse Practitioner

## 2018-09-04 ENCOUNTER — Ambulatory Visit (INDEPENDENT_AMBULATORY_CARE_PROVIDER_SITE_OTHER): Payer: Medicare Other | Admitting: Family Medicine

## 2018-09-04 ENCOUNTER — Telehealth: Payer: Self-pay | Admitting: Family Medicine

## 2018-09-04 ENCOUNTER — Encounter: Payer: Self-pay | Admitting: Family Medicine

## 2018-09-04 VITALS — BP 122/78 | Ht 64.0 in | Wt 107.0 lb

## 2018-09-04 DIAGNOSIS — Z0000 Encounter for general adult medical examination without abnormal findings: Secondary | ICD-10-CM | POA: Diagnosis not present

## 2018-09-04 MED ORDER — CITALOPRAM HYDROBROMIDE 10 MG PO TABS: 10.0000 mg | ORAL_TABLET | Freq: Every day | ORAL | 3 refills | Status: DC

## 2018-09-04 NOTE — Telephone Encounter (Signed)
Spoke with Tammy concerning Celexa. Informed her that it was to be 10 mg. Verbalized understanding.

## 2018-09-04 NOTE — Telephone Encounter (Signed)
Pharmacy calling in regards of patients citalopram (CELEXA) 10 MG tablet, patient's dosage of medication was 20mg  and they wanted to clarify that dosage for medication was meant to be 10mg  not 20mg .

## 2018-09-04 NOTE — Progress Notes (Signed)
   Subjective:    Patient ID: Pamela Yang, female    DOB: [birth year], 67 y.o.   MRN: [id number]  HPI  AWV- Annual Wellness Visit  The patient was seen for their annual wellness visit. The patient's past medical history, surgical history, and family history were reviewed. Pertinent vaccines were reviewed ( tetanus, pneumonia, shingles, flu) The patient's medication list was reviewed and updated.  The height and weight were entered.  BMI recorded in electronic record elsewhere  Cognitive screening was completed. Outcome of Mini - Cog: Passed   Falls /depression screening electronically recorded within record elsewhere None  Current tobacco usage:No (All patients who use tobacco were given written and verbal information on quitting)  Recent listing of emergency department/hospitalizations over the past year were reviewed.  current specialist the patient sees on a regular basis: She sees Dr.Balan, She exercises daily she walks 4-6 miles per day.   Medicare annual wellness visit patient questionnaire was reviewed.  A written screening schedule for the patient for the next 5-10 years was given. Appropriate discussion of followup regarding next visit was discussed.  Patient states she will wait on the female physical to have Mendel Ryder do it. She has had her influenza shot last week at [facility] office.   Review of Systems  Constitutional: Negative for activity change, appetite change and fatigue.  HENT: Negative for congestion and rhinorrhea.   Eyes: Negative for discharge.  Respiratory: Negative for cough, chest tightness and wheezing.   Cardiovascular: Negative for chest pain.  Gastrointestinal: Negative for abdominal pain, blood in stool and vomiting.  Endocrine: Negative for polyphagia.  Genitourinary: Negative for difficulty urinating and frequency.  Musculoskeletal: Negative for neck pain.  Skin: Negative for color change.  Allergic/Immunologic: Negative for  environmental allergies and food allergies.  Neurological: Negative for weakness and headaches.  Psychiatric/Behavioral: Negative for agitation and behavioral problems.       Objective:   Physical Exam  Constitutional: She is oriented to person, place, and time. She appears well-developed and well-nourished.  HENT:  Head: Normocephalic.  Right Ear: External ear normal.  Left Ear: External ear normal.  Eyes: Right eye exhibits no discharge. Left eye exhibits no discharge.  Neck: No thyromegaly present.  Cardiovascular: Normal rate, regular rhythm, normal heart sounds and intact distal pulses.  No murmur heard. Pulmonary/Chest: Effort normal and breath sounds normal. No respiratory distress. She has no wheezes.  Abdominal: Soft. Bowel sounds are normal. She exhibits no distension and no mass. There is no tenderness.  Musculoskeletal: Normal range of motion. She exhibits no edema or tenderness.  Lymphadenopathy:    She has no cervical adenopathy.  Neurological: She is alert and oriented to person, place, and time. She exhibits normal muscle tone.  Skin: Skin is warm and dry.  Psychiatric: She has a normal mood and affect. Her behavior is normal.          Assessment & Plan:  Patient here today for her annual wellness Medicare wellness overview Limited physical exam normal Patient's moods are doing well she would like to be on the lower dose of Celexa we will reduce this to 10 mg daily She uses omeprazole only when necessary She is not due for any bone density mammogram Pap smear or colonoscopy currently I did recommend shingrix vaccine today

## 2018-09-19 ENCOUNTER — Encounter (HOSPITAL_COMMUNITY): Payer: Self-pay | Admitting: Occupational Therapy

## 2018-11-02 DIAGNOSIS — M810 Age-related osteoporosis without current pathological fracture: Secondary | ICD-10-CM | POA: Diagnosis not present

## 2018-11-08 ENCOUNTER — Encounter (HOSPITAL_COMMUNITY): Payer: Self-pay | Admitting: Occupational Therapy

## 2018-11-30 ENCOUNTER — Other Ambulatory Visit (HOSPITAL_COMMUNITY)
Admission: RE | Admit: 2018-11-30 | Discharge: 2018-11-30 | Disposition: A | Discharge status: Home / Self Care | Payer: Medicare Other | Source: Ambulatory Visit | Attending: Family Medicine | Admitting: Family Medicine

## 2018-11-30 ENCOUNTER — Telehealth: Payer: Self-pay | Admitting: *Deleted

## 2018-11-30 ENCOUNTER — Ambulatory Visit: Payer: Medicare Other | Admitting: Family Medicine

## 2018-11-30 ENCOUNTER — Encounter: Payer: Self-pay | Admitting: Family Medicine

## 2018-11-30 VITALS — BP 122/80 | Temp 97.4°F | Ht 64.0 in | Wt 105.0 lb

## 2018-11-30 DIAGNOSIS — R1013 Epigastric pain: Secondary | ICD-10-CM

## 2018-11-30 DIAGNOSIS — R1084 Generalized abdominal pain: Secondary | ICD-10-CM | POA: Diagnosis not present

## 2018-11-30 LAB — CBC WITH DIFFERENTIAL/PLATELET
Abs Immature Granulocytes: 0.01 10*3/uL (ref 0.00–0.07)
Basophils Absolute: 0.1 10*3/uL (ref 0.0–0.1)
Basophils Relative: 1 %
EOS PCT: 3 %
Eosinophils Absolute: 0.1 10*3/uL (ref 0.0–0.5)
HCT: 40.5 % (ref 36.0–46.0)
Hemoglobin: 12.8 g/dL (ref 12.0–15.0)
Immature Granulocytes: 0 %
Lymphocytes Relative: 28 %
Lymphs Abs: 1.5 10*3/uL (ref 0.7–4.0)
MCH: 28.2 pg (ref 26.0–34.0)
MCHC: 31.6 g/dL (ref 30.0–36.0)
MCV: 89.2 fL (ref 80.0–100.0)
Monocytes Absolute: 0.5 10*3/uL (ref 0.1–1.0)
Monocytes Relative: 9 %
Neutro Abs: 3.1 10*3/uL (ref 1.7–7.7)
Neutrophils Relative %: 59 %
Platelets: 160 10*3/uL (ref 150–400)
RBC: 4.54 MIL/uL (ref 3.87–5.11)
RDW: 14.6 % (ref 11.5–15.5)
WBC: 5.2 10*3/uL (ref 4.0–10.5)
nRBC: 0 % (ref 0.0–0.2)

## 2018-11-30 LAB — HEPATIC FUNCTION PANEL
ALT: 27 U/L (ref 0–44)
AST: 28 U/L (ref 15–41)
Albumin: 4.3 g/dL (ref 3.5–5.0)
Alkaline Phosphatase: 54 U/L (ref 38–126)
Bilirubin, Direct: <0.1 mg/dL (ref 0.0–0.2)
Total Bilirubin: 0.6 mg/dL (ref 0.3–1.2)
Total Protein: 7.1 g/dL (ref 6.5–8.1)

## 2018-11-30 LAB — BASIC METABOLIC PANEL
Anion gap: 7 (ref 5–15)
BUN: 27 mg/dL — ABNORMAL HIGH (ref 8–23)
CO2: 24 mmol/L (ref 22–32)
Calcium: 9.2 mg/dL (ref 8.9–10.3)
Chloride: 105 mmol/L (ref 98–111)
Creatinine, Ser: 0.75 mg/dL (ref 0.44–1.00)
GFR calc Af Amer: >60 mL/min (ref >60)
GFR calc non Af Amer: >60 mL/min (ref >60)
GLUCOSE: 115 mg/dL — AB (ref 70–99)
Potassium: 3.7 mmol/L (ref 3.5–5.1)
Sodium: 136 mmol/L (ref 135–145)

## 2018-11-30 LAB — LIPASE, BLOOD: LIPASE: 38 U/L (ref 11–51)

## 2018-11-30 NOTE — Telephone Encounter (Signed)
Blood work ordered in Epic. Patient notified. 

## 2018-11-30 NOTE — Telephone Encounter (Signed)
It would be wise for the patient to do stat lab work This would be best through the hospital lab CBC, metabolic 7, liver, lipase Diagnosis abdominal pain If it is possible to get the lab work early afternoon there is a reasonable chance we will get the results back possibly by her appointment or shortly thereafter Thank you  Any other test will be determined at her office visit

## 2018-11-30 NOTE — Addendum Note (Signed)
Addended by: Dairl Ponder on: 11/30/2018 01:23 PM   Modules accepted: Orders

## 2018-11-30 NOTE — Telephone Encounter (Signed)
Patient reports pain under her ribs that radiated on both sides and went down to pubic bone and started about 9:30. Patient states she had not eaten and wasn't nauseated but didn't want to eat. The pain went on till about 2:30 when she had a severe pain that lasted about 30 sec(so bad she was going to dial 911) but then it stopped and all the pain went completely away. Patient has appt today at 4 pm with Dr Nicki Reaper

## 2018-11-30 NOTE — Progress Notes (Signed)
   Subjective:    Patient ID: Pamela Yang, female    DOB: 08-11-1951, 67 y.o.   MRN: [id number]  HPI Patient is here today with complaints of severe mid abdominal pain that radiated to her pubic bone. She says she started having the pain yesterday,she had it around five hours,then the pain subsided. She states it hurt so bad that she contemplated going to the Ed, then all of sudden it escalated and then stopped.She states she did not take any medications while this was going on. She does have a history of IBS,but unlike anything she has ever experience with that.She is not on any IBS medication.She does take Prilosec 20 mg three times per week. She states during the time she was in pain she did have a Bm,but the pain did not quit after the bm. She has not had any pain today.  She relates pretty severe pain that lasted for several hours then extenuated did not break out in a sweat but did get nausea with it and felt a lot of pain mainly in the epigastric region she denies any underlying issues   Review of Systems  Constitutional: Negative for activity change and appetite change.  HENT: Negative for congestion and rhinorrhea.   Respiratory: Negative for cough and shortness of breath.   Cardiovascular: Negative for chest pain and leg swelling.  Gastrointestinal: Positive for abdominal pain. Negative for nausea and vomiting.  Skin: Negative for color change.  Neurological: Negative for dizziness and weakness.  Psychiatric/Behavioral: Negative for agitation and confusion.       Objective:   Physical Exam Vitals signs reviewed.  Constitutional:      Appearance: She is well-developed.  HENT:     Head: Normocephalic.  Cardiovascular:     Rate and Rhythm: Normal rate and regular rhythm.     Heart sounds: Normal heart sounds. No murmur.  Pulmonary:     Effort: Pulmonary effort is normal.     Breath sounds: Normal breath sounds.  Skin:    General: Skin is warm and dry.  Neurological:     Mental Status: She is alert.    Abdominal exam normal no guarding or rebound or tenderness       Assessment & Plan:  Abdominal pain Labs look good Recommend ultrasound May need HIDA or EGD Warning signs discussed with patient if severe pain proceed to ER

## 2018-12-04 ENCOUNTER — Ambulatory Visit (HOSPITAL_COMMUNITY)
Admission: RE | Admit: 2018-12-04 | Discharge: 2018-12-04 | Disposition: A | Discharge status: Home / Self Care | Payer: Medicare Other | Source: Ambulatory Visit | Attending: Family Medicine | Admitting: Family Medicine

## 2018-12-04 ENCOUNTER — Other Ambulatory Visit: Payer: Self-pay | Admitting: Family Medicine

## 2018-12-04 DIAGNOSIS — R1011 Right upper quadrant pain: Secondary | ICD-10-CM | POA: Diagnosis not present

## 2018-12-04 DIAGNOSIS — R1013 Epigastric pain: Secondary | ICD-10-CM

## 2018-12-04 DIAGNOSIS — R1084 Generalized abdominal pain: Secondary | ICD-10-CM

## 2018-12-06 ENCOUNTER — Telehealth: Payer: Self-pay | Admitting: Family Medicine

## 2018-12-06 NOTE — Telephone Encounter (Signed)
Please e-sign order for HIDA scan  Original order had to be changed per radiology - they changed to order just need Dr. Nicki Reaper to e-sign

## 2018-12-08 DIAGNOSIS — M25571 Pain in right ankle and joints of right foot: Secondary | ICD-10-CM | POA: Insufficient documentation

## 2018-12-08 NOTE — Telephone Encounter (Signed)
This was signed thank you 

## 2018-12-13 ENCOUNTER — Encounter (HOSPITAL_COMMUNITY)
Admission: RE | Admit: 2018-12-13 | Discharge: 2018-12-13 | Disposition: A | Discharge status: Home / Self Care | Payer: Medicare Other | Source: Ambulatory Visit | Attending: Family Medicine | Admitting: Family Medicine

## 2018-12-13 ENCOUNTER — Other Ambulatory Visit: Payer: Self-pay | Admitting: Family Medicine

## 2018-12-13 ENCOUNTER — Encounter (HOSPITAL_COMMUNITY): Payer: Self-pay

## 2018-12-13 DIAGNOSIS — R1084 Generalized abdominal pain: Secondary | ICD-10-CM

## 2018-12-13 DIAGNOSIS — R112 Nausea with vomiting, unspecified: Secondary | ICD-10-CM | POA: Diagnosis not present

## 2018-12-13 DIAGNOSIS — R109 Unspecified abdominal pain: Secondary | ICD-10-CM | POA: Diagnosis not present

## 2018-12-13 MED ORDER — MORPHINE SULFATE (PF) 2 MG/ML IV SOLN
INTRAVENOUS | Status: AC
  Administered 2018-12-13: 2 mg via INTRAVENOUS
  Filled 2018-12-13: qty 1

## 2018-12-13 MED ORDER — TECHNETIUM TC 99M MEBROFENIN IV KIT
5.0000 | PACK | Freq: Once | INTRAVENOUS | Status: AC | PRN
  Administered 2018-12-13: 5.5 via INTRAVENOUS

## 2018-12-13 MED ORDER — MORPHINE SULFATE (PF) 2 MG/ML IV SOLN
2.0000 mg | Freq: Once | INTRAVENOUS | Status: AC
  Administered 2018-12-13: 2 mg via INTRAVENOUS
  Filled 2018-12-13: qty 1

## 2018-12-14 ENCOUNTER — Ambulatory Visit (HOSPITAL_COMMUNITY): Payer: Medicare Other

## 2018-12-14 DIAGNOSIS — H43811 Vitreous degeneration, right eye: Secondary | ICD-10-CM | POA: Diagnosis not present

## 2018-12-14 DIAGNOSIS — Z961 Presence of intraocular lens: Secondary | ICD-10-CM | POA: Diagnosis not present

## 2018-12-14 DIAGNOSIS — H04123 Dry eye syndrome of bilateral lacrimal glands: Secondary | ICD-10-CM | POA: Diagnosis not present

## 2018-12-17 ENCOUNTER — Other Ambulatory Visit: Payer: Self-pay

## 2018-12-17 ENCOUNTER — Encounter: Payer: Self-pay | Admitting: Family Medicine

## 2018-12-17 DIAGNOSIS — K828 Other specified diseases of gallbladder: Secondary | ICD-10-CM

## 2018-12-20 ENCOUNTER — Encounter: Payer: Self-pay | Admitting: General Surgery

## 2018-12-20 ENCOUNTER — Ambulatory Visit: Payer: Medicare Other | Admitting: General Surgery

## 2018-12-20 VITALS — BP 118/73 | HR 51 | Temp 97.8°F | Resp 16 | Wt 107.0 lb

## 2018-12-20 DIAGNOSIS — K811 Chronic cholecystitis: Secondary | ICD-10-CM

## 2018-12-20 NOTE — Patient Instructions (Signed)
Biliary Colic, Adult  Biliary colic is severe pain caused by a problem with a small organ in the upper right part of your belly (gallbladder). The gallbladder stores a digestive fluid produced in the liver (bile) that helps the body break down fat. Bile and other digestive enzymes are carried from the liver to the small intestine through tube-like structures (bile ducts). The gallbladder and the bile ducts form the biliary tract. Sometimes hard deposits of digestive fluids form in the gallbladder (gallstones) and block the flow of bile from the gallbladder, causing biliary colic. This condition is also called a gallbladder attack. Gallstones can be as small as a grain of sand or as big as a golf ball. There could be just one gallstone in the gallbladder, or there could be many. What are the causes? Biliary colic is usually caused by gallstones. Less often, a tumor could block the flow of bile from the gallbladder and trigger biliary colic. What increases the risk? This condition is more likely to develop in:  Women.  People of Hispanic descent.  People with a family history of gallstones.  People who are obese.  People who suddenly or quickly lose weight.  People who eat a high-calorie, low-fiber diet that is rich in refined carbs (carbohydrates), such as white bread and white rice.  People who have an intestinal disease that affects nutrient absorption, such as Crohn disease.  People who have a metabolic condition, such as metabolic syndrome or diabetes. What are the signs or symptoms? Severe pain in the upper right side of the belly is the main symptom of biliary colic. You may feel this pain below the chest but above the hip. This pain often occurs at night or after eating a very fatty meal. This pain may get worse for up to an hour and last as long as 12 hours. In most cases, the pain fades (subsides) within a couple hours. Other symptoms of this condition include:  Nausea and  vomiting.  Pain under the right shoulder. How is this diagnosed? This condition is diagnosed based on your medical history, your symptoms, and a physical exam. You may have tests, including:  Blood tests to rule out infection or inflammation of the bile ducts, gallbladder, pancreas, or liver.  Imaging studies such as: ? Ultrasound. ? CT scan. ? MRI. In some cases, you may need to have an imaging study done using a small amount of radioactive material (nuclear medicine) to confirm the diagnosis. How is this treated? Treatment for this condition may include medicine to relieve your pain or nausea. If you have gallstones that are causing biliary colic, you may need surgery to remove the gallbladder (cholecystectomy). Gallstones can also be dissolved gradually with medicine. It may take months or years before the gallstones are completely gone. Follow these instructions at home:  Take over-the-counter and prescription medicines only as told by your health care provider.  Drink enough fluid to keep your urine clear or pale yellow.  Follow instructions from your health care provider about eating or drinking restrictions. These may include avoiding: ? Fatty, greasy, and fried foods. ? Any foods that make the pain worse. ? Overeating. ? Having a large meal after not eating for a while.  Keep all follow-up visits as told by your health care provider. This is important. How is this prevented? Steps to prevent this condition include:  Maintaining a healthy body weight.  Getting regular exercise.  Eating a healthy, high-fiber, low-fat diet.  Limiting how much   sugar and refined carbs you eat, such as sweets, white flour, and white rice. Contact a health care provider if:  Your pain lasts more than 5 hours.  You vomit.  You have a fever and chills.  Your pain gets worse. Get help right away if:  Your skin or the whites of your eyes look yellow (jaundice).  Your have tea-colored  urine and light-colored stools.  You are dizzy or you faint. Summary  Biliary colic is severe pain caused by a problem with a small organ in the upper right part of your belly (gallbladder).  Treatments for this condition include medicines that relieves your pain or nausea and medicines that slowly dissolves the gallstones.  If gallstones cause your biliary colic, the treatment is surgery to remove the gallbladder (cholecystectomy). This information is not intended to replace advice given to you by your health care provider. Make sure you discuss any questions you have with your health care provider. Document Released: 05/08/2006 Document Revised: 06/12/2017 Document Reviewed: 06/20/2016 Elsevier Interactive Patient Education  2019 Elsevier Inc.  

## 2018-12-21 NOTE — Progress Notes (Signed)
Pamela Yang; [id number]; 01/13/1951   HPI Patient is a 68 year old white female who was referred to my care by Dr. Sallee Lange for evaluation treatment of biliary colic.  Patient states that she has a history of IBS as well as gastroparesis.  Several weeks ago, she had an episode of severe epigastric pain with nausea.  She did not have right upper quadrant abdominal pain.  No emesis was noted.  She denies any fever, chills, jaundice, or fatty food intolerance.  The pain was severe in nature.  And ultimately resolved on its own.  Ultrasound gallbladder was negative for cholelithiasis.  Hepatobiliary scan revealed chronic cholecystitis.  She states that since that episode, she has not had any right upper quadrant abdominal pain.  She does have episodes of abdominal bloating and fullness.  She currently has 0 out of 10 abdominal pain. Past Medical History:  Diagnosis Date  . GAD (generalized anxiety disorder)   . Gastroparesis 5/08   Dr. Stann Mainland  . GERD (gastroesophageal reflux disease)   . Hearing loss   . Osteoporosis     Past Surgical History:  Procedure Laterality Date  . COLONOSCOPY  6/12   Dr Laural Golden    Family History  Problem Relation Age of Onset  . Heart attack Mother   . Ovarian cancer Sister     Current Outpatient Medications on File Prior to Visit  Medication Sig Dispense Refill  . Ascorbic Acid (VITAMIN C PO) Take by mouth. daily    . aspirin 81 MG tablet Take 81 mg by mouth daily.    Marland Kitchen CALCIUM-VITAMIN D PO Take by mouth. bid    . citalopram (CELEXA) 10 MG tablet Take 1 tablet (10 mg total) by mouth daily. 90 tablet 3  . denosumab (PROLIA) 60 MG/ML SOLN injection Inject 60 mg into the skin every 6 (six) months. Administer in upper arm, thigh, or abdomen    . Flaxseed, Linseed, (FLAXSEED OIL PO) Take by mouth. 2 daily    . IRON PO Take by mouth. daily    . Multiple Vitamins-Minerals (MULTIVITAMIN WITH MINERALS) tablet Take 1 tablet by mouth daily.    Marland Kitchen omeprazole  (PRILOSEC) 20 MG capsule Take 20 mg by mouth daily.     No current facility-administered medications on file prior to visit.     No Known Allergies  Social History   Substance and Sexual Activity  Alcohol Use Not on file    Social History   Tobacco Use  Smoking Status Never Smoker  Smokeless Tobacco Never Used    Review of Systems  Constitutional: Negative.   HENT: Negative.   Eyes: Negative.   Respiratory: Negative.   Cardiovascular: Negative.   Gastrointestinal: Positive for abdominal pain.  Genitourinary: Negative.   Musculoskeletal: Negative.   Skin: Negative.   Neurological: Negative.   Endo/Heme/Allergies: Negative.   Psychiatric/Behavioral: Negative.     Objective   Vitals:   12/20/18 1141  BP: 118/73  Pulse: (!) 51  Resp: 16  Temp: 97.8 F (36.6 C)    Physical Exam Vitals signs reviewed.  Constitutional:      Appearance: She is well-developed. She is not ill-appearing.  Eyes:     General: No scleral icterus. Cardiovascular:     Rate and Rhythm: Normal rate and regular rhythm.     Heart sounds: Normal heart sounds. No murmur. No friction rub. No gallop.   Pulmonary:     Effort: Pulmonary effort is normal. No respiratory distress.     Breath  sounds: Normal breath sounds. No stridor. No wheezing, rhonchi or rales.  Abdominal:     General: Abdomen is flat. Bowel sounds are normal. There is no distension.     Palpations: Abdomen is soft. There is no hepatomegaly or mass.     Tenderness: There is no abdominal tenderness. Negative signs include Murphy's sign.  Skin:    General: Skin is warm and dry.  Neurological:     Mental Status: She is alert and oriented to person, place, and time.    Ultrasound and HIDA scan reports reviewed.  Liver enzyme tests within normal limits. Assessment  Chronic cholecystitis, currently asymptomatic.  Patient symptoms may have be related to her IBS and gastroparesis. Plan   Patient does not want surgical  intervention unless absolutely necessary.  That is fine with me.  I told her that should she develop right upper quadrant abdominal pain with nausea and vomiting, she should contact me.  She understands and agrees.  Information about biliary colic given to patient.  Follow-up as needed.

## 2019-01-30 ENCOUNTER — Other Ambulatory Visit (HOSPITAL_COMMUNITY): Payer: Self-pay | Admitting: Family Medicine

## 2019-01-30 DIAGNOSIS — Z1231 Encounter for screening mammogram for malignant neoplasm of breast: Secondary | ICD-10-CM

## 2019-02-13 ENCOUNTER — Ambulatory Visit (HOSPITAL_COMMUNITY)
Admission: RE | Admit: 2019-02-13 | Discharge: 2019-02-13 | Disposition: A | Discharge status: Home / Self Care | Payer: Medicare Other | Source: Ambulatory Visit | Attending: Family Medicine | Admitting: Family Medicine

## 2019-02-13 ENCOUNTER — Encounter (HOSPITAL_COMMUNITY): Payer: Self-pay

## 2019-02-13 DIAGNOSIS — Z1231 Encounter for screening mammogram for malignant neoplasm of breast: Secondary | ICD-10-CM | POA: Diagnosis not present

## 2019-02-15 DIAGNOSIS — Z1231 Encounter for screening mammogram for malignant neoplasm of breast: Secondary | ICD-10-CM | POA: Diagnosis not present

## 2019-04-30 ENCOUNTER — Telehealth: Payer: Self-pay | Admitting: Family Medicine

## 2019-04-30 NOTE — Telephone Encounter (Signed)
The patient is having a very difficult time currently Her husband is dying. She will be consulting hospice. She feels her Celexa is helping she does not want adjustments of the medicine she is just hoping for some emotional support with hospice We will be consulting hospice for her husband

## 2019-05-10 DIAGNOSIS — M810 Age-related osteoporosis without current pathological fracture: Secondary | ICD-10-CM | POA: Diagnosis not present

## 2019-08-07 ENCOUNTER — Telehealth: Payer: Self-pay | Admitting: Family Medicine

## 2019-08-07 DIAGNOSIS — E785 Hyperlipidemia, unspecified: Secondary | ICD-10-CM

## 2019-08-07 DIAGNOSIS — Z79899 Other long term (current) drug therapy: Secondary | ICD-10-CM

## 2019-08-07 MED ORDER — ZOSTER VAC RECOMB ADJUVANTED 50 MCG/0.5ML IM SUSR: 0.5000 mL | Freq: Once | INTRAMUSCULAR | 1 refills | Status: AC

## 2019-08-07 NOTE — Telephone Encounter (Signed)
bw orders and shingrix rx put in mail.

## 2019-08-07 NOTE — Telephone Encounter (Signed)
Lipid, metabolic 7, hyperlipidemia, high risk med/screening Send her the paperwork to do the lab work in September  Also send her prescription for shin Grix  Also have front call her to set up virtual visit mid-September for medication follow-up

## 2019-08-07 NOTE — Addendum Note (Signed)
Addended by: Carmelina Noun on: 08/07/2019 09:21 AM   Modules accepted: Orders

## 2019-08-07 NOTE — Telephone Encounter (Signed)
Please call and schedule pt appt mid September per dr Nicki Reaper. See his message below. thanks

## 2019-08-07 NOTE — Telephone Encounter (Signed)
Pt is on schedule for Oct 1st

## 2019-08-07 NOTE — Telephone Encounter (Signed)
lvm to call and schedule virtual visit  °

## 2019-08-07 NOTE — Addendum Note (Signed)
Addended by: Carmelina Noun on: 08/07/2019 09:23 AM   Modules accepted: Orders

## 2019-08-27 DIAGNOSIS — M8589 Other specified disorders of bone density and structure, multiple sites: Secondary | ICD-10-CM | POA: Diagnosis not present

## 2019-09-03 DIAGNOSIS — E559 Vitamin D deficiency, unspecified: Secondary | ICD-10-CM | POA: Diagnosis not present

## 2019-09-03 DIAGNOSIS — M810 Age-related osteoporosis without current pathological fracture: Secondary | ICD-10-CM | POA: Diagnosis not present

## 2019-09-09 DIAGNOSIS — Z79899 Other long term (current) drug therapy: Secondary | ICD-10-CM | POA: Diagnosis not present

## 2019-09-09 DIAGNOSIS — E785 Hyperlipidemia, unspecified: Secondary | ICD-10-CM | POA: Diagnosis not present

## 2019-09-10 LAB — HEPATIC FUNCTION PANEL
ALT: 24 IU/L (ref 0–32)
AST: 27 IU/L (ref 0–40)
Albumin: 4.4 g/dL (ref 3.8–4.8)
Alkaline Phosphatase: 77 IU/L (ref 39–117)
Bilirubin Total: 0.6 mg/dL (ref 0.0–1.2)
Bilirubin, Direct: 0.16 mg/dL (ref 0.00–0.40)
Total Protein: 6.9 g/dL (ref 6.0–8.5)

## 2019-09-10 LAB — BASIC METABOLIC PANEL
BUN/Creatinine Ratio: 12 (ref 12–28)
BUN: 13 mg/dL (ref 8–27)
CO2: 26 mmol/L (ref 20–29)
Calcium: 10.5 mg/dL — ABNORMAL HIGH (ref 8.7–10.3)
Chloride: 105 mmol/L (ref 96–106)
Creatinine, Ser: 1.05 mg/dL — ABNORMAL HIGH (ref 0.57–1.00)
GFR calc Af Amer: 62 mL/min/{1.73_m2} (ref >59)
GFR calc non Af Amer: 54 mL/min/{1.73_m2} — ABNORMAL LOW (ref >59)
Glucose: 99 mg/dL (ref 65–99)
Potassium: 4.6 mmol/L (ref 3.5–5.2)
Sodium: 143 mmol/L (ref 134–144)

## 2019-09-10 LAB — LIPID PANEL
Chol/HDL Ratio: 2.6 ratio (ref 0.0–4.4)
Cholesterol, Total: 207 mg/dL — ABNORMAL HIGH (ref 100–199)
HDL: 81 mg/dL (ref >39)
LDL Chol Calc (NIH): 111 mg/dL — ABNORMAL HIGH (ref 0–99)
Triglycerides: 88 mg/dL (ref 0–149)
VLDL Cholesterol Cal: 15 mg/dL (ref 5–40)

## 2019-09-19 ENCOUNTER — Ambulatory Visit (INDEPENDENT_AMBULATORY_CARE_PROVIDER_SITE_OTHER): Payer: Medicare Other | Admitting: Family Medicine

## 2019-09-19 DIAGNOSIS — F418 Other specified anxiety disorders: Secondary | ICD-10-CM | POA: Diagnosis not present

## 2019-09-19 DIAGNOSIS — M810 Age-related osteoporosis without current pathological fracture: Secondary | ICD-10-CM

## 2019-09-19 DIAGNOSIS — E785 Hyperlipidemia, unspecified: Secondary | ICD-10-CM | POA: Diagnosis not present

## 2019-09-19 NOTE — Progress Notes (Signed)
   Subjective:    Patient ID: Pamela Yang, female    DOB: May 24, 1951, 68 y.o.   MRN: [id number]  HPI Pt here for follow up. Pt states she would like to discuss blood work. Pt states she is not taking Omeprazole any more. Pt states he is doing ok on all other medications.  Patient overall doing well.  Takes citalopram on a regular basis for moods it is helping her.  Her moods are doing okay.  She is stressed at times taking care of her husband who has terminal Alzheimer's but she is doing the best she can She is also seen the endocrinologist and they are treating her with Prolia.  She states she is tolerating this well. Her calcium slightly up creatinine slightly elevated.  Previous creatinines look good Virtual Visit via Video Note She did do liver functions it came back looking good. LDL cholesterol mildly elevated but she is blessed with a large amount of HDL.  I connected with Noralee Space on 09/19/19 at  2:00 PM EDT by a video enabled telemedicine application and verified that I am speaking with the correct person using two identifiers.  Location: Patient: home Provider: office   I discussed the limitations of evaluation and management by telemedicine and the availability of in person appointments. The patient expressed understanding and agreed to proceed.  History of Present Illness:    Observations/Objective:   Assessment and Plan:   Follow Up Instructions:    I discussed the assessment and treatment plan with the patient. The patient was provided an opportunity to ask questions and all were answered. The patient agreed with the plan and demonstrated an understanding of the instructions.   The patient was advised to call back or seek an in-person evaluation if the symptoms worsen or if the condition fails to improve as anticipated.  I provided 16 minutes of non-face-to-face time during this encounter.   Vicente Males, LPN    Review of Systems   Constitutional: Negative for activity change, appetite change and fatigue.  HENT: Negative for congestion and rhinorrhea.   Respiratory: Negative for cough and shortness of breath.   Cardiovascular: Negative for chest pain and leg swelling.  Gastrointestinal: Negative for abdominal pain and diarrhea.  Endocrine: Negative for polydipsia and polyphagia.  Skin: Negative for color change.  Neurological: Negative for dizziness and weakness.  Psychiatric/Behavioral: Negative for behavioral problems and confusion.       Objective:   Physical Exam  Patient had virtual visit Appears to be in no distress Atraumatic Neuro able to relate and oriented No apparent resp distress Color normal  Patient with osteoporosis being treated by endocrinology with Prolia     Assessment & Plan:  Elevated creatinine we will find out from her endocrinologist what her creatinine was on recent lab work continue current measures.  Minimize protein in the diet stay active  History of depression and anxiety patient overall doing very well continue current medication.  Patient will follow-up 6 months sooner if any problems

## 2019-09-20 NOTE — Progress Notes (Signed)
Left message on medical records [facility]) voicemail. Will also send fax to [facility].

## 2019-09-21 ENCOUNTER — Other Ambulatory Visit: Payer: Self-pay | Admitting: Family Medicine

## 2019-11-11 DIAGNOSIS — M810 Age-related osteoporosis without current pathological fracture: Secondary | ICD-10-CM | POA: Diagnosis not present

## 2019-12-16 DIAGNOSIS — H524 Presbyopia: Secondary | ICD-10-CM | POA: Diagnosis not present

## 2019-12-31 ENCOUNTER — Other Ambulatory Visit (HOSPITAL_COMMUNITY): Payer: Self-pay | Admitting: Family Medicine

## 2019-12-31 DIAGNOSIS — Z1231 Encounter for screening mammogram for malignant neoplasm of breast: Secondary | ICD-10-CM

## 2020-02-17 ENCOUNTER — Other Ambulatory Visit: Payer: Self-pay

## 2020-02-17 ENCOUNTER — Ambulatory Visit (HOSPITAL_COMMUNITY)
Admission: RE | Admit: 2020-02-17 | Discharge: 2020-02-17 | Disposition: A | Discharge status: Home / Self Care | Payer: Medicare Other | Source: Ambulatory Visit | Attending: Family Medicine | Admitting: Family Medicine

## 2020-02-17 DIAGNOSIS — Z1231 Encounter for screening mammogram for malignant neoplasm of breast: Secondary | ICD-10-CM | POA: Diagnosis not present

## 2020-03-09 ENCOUNTER — Encounter (HOSPITAL_COMMUNITY): Payer: Self-pay

## 2020-03-09 ENCOUNTER — Ambulatory Visit: Payer: Medicare Other | Admitting: Family Medicine

## 2020-03-09 ENCOUNTER — Other Ambulatory Visit (HOSPITAL_COMMUNITY)
Admission: RE | Admit: 2020-03-09 | Discharge: 2020-03-09 | Disposition: A | Discharge status: Home / Self Care | Payer: Medicare Other | Source: Ambulatory Visit | Attending: Family Medicine | Admitting: Family Medicine

## 2020-03-09 ENCOUNTER — Other Ambulatory Visit: Payer: Self-pay

## 2020-03-09 ENCOUNTER — Encounter: Payer: Self-pay | Admitting: Family Medicine

## 2020-03-09 ENCOUNTER — Telehealth: Payer: Self-pay | Admitting: Family Medicine

## 2020-03-09 ENCOUNTER — Emergency Department (HOSPITAL_COMMUNITY)
Admission: EM | Admit: 2020-03-09 | Discharge: 2020-03-09 | Disposition: A | Discharge status: Home / Self Care | Payer: Medicare Other | Attending: Emergency Medicine | Admitting: Emergency Medicine

## 2020-03-09 ENCOUNTER — Emergency Department (HOSPITAL_COMMUNITY): Payer: Medicare Other

## 2020-03-09 VITALS — BP 118/80 | Temp 97.5°F | Ht 64.0 in | Wt 104.6 lb

## 2020-03-09 DIAGNOSIS — R079 Chest pain, unspecified: Secondary | ICD-10-CM

## 2020-03-09 DIAGNOSIS — Z79899 Other long term (current) drug therapy: Secondary | ICD-10-CM | POA: Diagnosis not present

## 2020-03-09 DIAGNOSIS — Z7982 Long term (current) use of aspirin: Secondary | ICD-10-CM | POA: Diagnosis not present

## 2020-03-09 DIAGNOSIS — R002 Palpitations: Secondary | ICD-10-CM

## 2020-03-09 DIAGNOSIS — R0789 Other chest pain: Secondary | ICD-10-CM | POA: Insufficient documentation

## 2020-03-09 DIAGNOSIS — R072 Precordial pain: Secondary | ICD-10-CM | POA: Diagnosis not present

## 2020-03-09 LAB — CBC
HCT: 40.2 % (ref 36.0–46.0)
Hemoglobin: 12.6 g/dL (ref 12.0–15.0)
MCH: 27.8 pg (ref 26.0–34.0)
MCHC: 31.3 g/dL (ref 30.0–36.0)
MCV: 88.7 fL (ref 80.0–100.0)
Platelets: 163 10*3/uL (ref 150–400)
RBC: 4.53 MIL/uL (ref 3.87–5.11)
RDW: 14.3 % (ref 11.5–15.5)
WBC: 7.7 10*3/uL (ref 4.0–10.5)
nRBC: 0 % (ref 0.0–0.2)

## 2020-03-09 LAB — TROPONIN I (HIGH SENSITIVITY)
Troponin I (High Sensitivity): 19 ng/L — ABNORMAL HIGH (ref <18)
Troponin I (High Sensitivity): 16 ng/L (ref <18)
Troponin I (High Sensitivity): 16 ng/L (ref <18)

## 2020-03-09 LAB — BASIC METABOLIC PANEL
Anion gap: 9 (ref 5–15)
BUN: 21 mg/dL (ref 8–23)
CO2: 26 mmol/L (ref 22–32)
Calcium: 9.6 mg/dL (ref 8.9–10.3)
Chloride: 105 mmol/L (ref 98–111)
Creatinine, Ser: 0.74 mg/dL (ref 0.44–1.00)
GFR calc Af Amer: >60 mL/min (ref >60)
GFR calc non Af Amer: >60 mL/min (ref >60)
Glucose, Bld: 90 mg/dL (ref 70–99)
Potassium: 3.5 mmol/L (ref 3.5–5.1)
Sodium: 140 mmol/L (ref 135–145)

## 2020-03-09 NOTE — Discharge Instructions (Signed)
Follow-up with cardiology in the next few days.  The contact information for the [facility] cardiology clinic has been provided in this discharge summary for you to call and make these arrangements.  Return to the emergency department in the meantime if you develop worsening pain, difficulty breathing, or other new and concerning symptoms.

## 2020-03-09 NOTE — Telephone Encounter (Signed)
Work-up in the ER was negative Patient now able to go home We will set up outpatient cardiology

## 2020-03-09 NOTE — ED Triage Notes (Signed)
Pt states that she went to the doctor today because yesterday she had some irregular heart beat and chest pain

## 2020-03-09 NOTE — Progress Notes (Signed)
   Subjective:    Patient ID: Pamela Yang, female    DOB: 09/19/1951, 69 y.o.   MRN: [id number]  HPIchest pain, sob, irregular heart beat happened yesterday morning about 5 mins after waking. Lasted about 45 minutes. Then about 2 hours later had another epidsode but not as bad as the first one. Has been under a lot of stress. Thinks she may of had a panic attack.   Patient states she felt tight in the chest but denied any severe substernal discomfort.  States that she started sweating some plus also feeling like she had to breathe faster.  As a result of all this she felt very worried.  She reached out to her neighbor who is a former family physician who came over talked with her and felt that she had a panic attack so therefore she followed up today  She does have a strong family history of heart disease.  Patient does not smoke.  Patient's cholesterol profile slight elevation with LDL but HDL has been excellent  Review of Systems  Constitutional: Negative for activity change, appetite change and fatigue.  HENT: Negative for congestion and rhinorrhea.   Respiratory: Negative for cough and shortness of breath.   Cardiovascular: Positive for chest pain and palpitations. Negative for leg swelling.  Gastrointestinal: Negative for abdominal pain and diarrhea.  Endocrine: Negative for polydipsia and polyphagia.  Skin: Negative for color change.  Neurological: Negative for dizziness and weakness.  Psychiatric/Behavioral: Negative for behavioral problems and confusion.       Objective:   Physical Exam Vitals reviewed.  Constitutional:      General: She is not in acute distress. HENT:     Head: Normocephalic and atraumatic.  Eyes:     General:        Right eye: No discharge.        Left eye: No discharge.  Neck:     Trachea: No tracheal deviation.  Cardiovascular:     Rate and Rhythm: Normal rate and regular rhythm.     Heart sounds: Normal heart sounds. No murmur.  Pulmonary:   Effort: Pulmonary effort is normal. No respiratory distress.     Breath sounds: Normal breath sounds.  Lymphadenopathy:     Cervical: No cervical adenopathy.  Skin:    General: Skin is warm and dry.  Neurological:     Mental Status: She is alert.     Coordination: Coordination normal.  Psychiatric:        Behavior: Behavior normal.    EKG none previous to compare against EKG shows slight ST segment depression in lead II in V1 and V2 have minimal progression and have the appearance of a possible Q wave       Assessment & Plan:  Palpitations with rapid heartbeat and chest tightness Patient has a unique home situation her husband has dementia and is in a hospice care.  She is the principal caretaker and is very difficult for her to be away. We agreed upon doing troponin and if this was normal pursue outpatient cardiology consult if abnormal ER  Troponin test came back abnormal slightly elevated.  Based upon this it is better part of what is done to have the patient have additional troponin studies and ER evaluation and possible consult with cardiology.  Patient agrees with this.  ERs doctor spoken with.  Triage spoken with.

## 2020-03-09 NOTE — ED Provider Notes (Signed)
[facility] EMERGENCY DEPARTMENT Provider Note   CSN: [id number] Arrival date & time: 03/09/20  1733     History Chief Complaint  Patient presents with  . Chest Pain    Pamela Yang is a 69 y.o. female.  Patient is a 69 year old female with history of anxiety, GERD, osteoporosis.  She presents today for evaluation of an episode of palpitations/chest discomfort that occurred yesterday morning.  Patient states that at approximately 8 AM she developed racing heart with associated tightness in her chest.  This episode lasted for approximately 45 minutes, then resolved.  She felt fine the rest of the day and evening.  This morning, she felt as though she should touch base with her doctor.  She saw Dr. Golden Circle in the office today and had blood work drawn.  He apparently informed her her troponin was mildly elevated.  Patient denies any symptoms at present.  She has no prior cardiac history and only cardiac risk factors is that of family history.  She denies any recent exertional symptoms.  The history is provided by the patient.  Chest Pain Pain location:  Substernal area Pain quality: tightness   Pain radiates to:  Does not radiate Pain severity:  Moderate Onset quality:  Sudden Duration:  45 minutes Timing:  Constant Progression:  Resolved Chronicity:  New      Past Medical History:  Diagnosis Date  . GAD (generalized anxiety disorder)   . Gastroparesis 5/08   Dr. Stann Mainland  . GERD (gastroesophageal reflux disease)   . Hearing loss   . Osteoporosis     Patient Active Problem List   Diagnosis Date Noted  . Depression with anxiety 02/28/2018  . Hyperlipidemia 12/31/2014  . Osteoporosis 08/13/2014  . SORE THROAT 03/31/2009  . DRY MOUTH 03/31/2009  . GASTROPARESIS 03/31/2009  . CONSTIPATION 03/31/2009  . NAUSEA 03/31/2009  . HEARTBURN 03/31/2009  . OTHER DYSPHAGIA 03/31/2009  . EPIGASTRIC PAIN 03/31/2009  . DEPRESSION, HX OF 03/31/2009    Past Surgical History:    Procedure Laterality Date  . COLONOSCOPY  6/12   Dr Laural Golden     OB History   No obstetric history on file.     Family History  Problem Relation Age of Onset  . Heart attack Mother   . Ovarian cancer Sister     Social History   Tobacco Use  . Smoking status: Never Smoker  . Smokeless tobacco: Never Used  Substance Use Topics  . Alcohol use: Yes  . Drug use: Never    Home Medications Prior to Admission medications   Medication Sig Start Date End Date Taking? Authorizing Provider  Ascorbic Acid (VITAMIN C PO) Take by mouth. daily    [provider]  aspirin 81 MG tablet Take 81 mg by mouth daily.    [provider]  CALCIUM-VITAMIN D PO Take by mouth. bid    [provider]  citalopram (CELEXA) 10 MG tablet TAKE ONE (1) TABLET BY MOUTH EVERY DAY 09/23/19   Kathyrn Drown, MD  denosumab (PROLIA) 60 MG/ML SOLN injection Inject 60 mg into the skin every 6 (six) months. Administer in upper arm, thigh, or abdomen    [provider]  Flaxseed, Linseed, (FLAXSEED OIL PO) Take by mouth. 2 daily    [provider]  IRON PO Take by mouth. daily    [provider]  Multiple Vitamins-Minerals (MULTIVITAMIN WITH MINERALS) tablet Take 1 tablet by mouth daily.    [provider]  Allergies    Patient has no known allergies.  Review of Systems   Review of Systems  Cardiovascular: Positive for chest pain.  All other systems reviewed and are negative.   Physical Exam Updated Vital Signs BP (!) 140/95 (BP Location: Right Arm)   Pulse (!) 108   Temp 97.8 F (36.6 C) (Oral)   Resp 18   Ht 5\' 4"  (1.626 m)   Wt 47.4 kg   SpO2 98%   BMI 17.95 kg/m   Physical Exam Vitals and nursing note reviewed.  Constitutional:      General: She is not in acute distress.    Appearance: She is well-developed. She is not diaphoretic.  HENT:     Head: Normocephalic and atraumatic.  Cardiovascular:     Rate and Rhythm: Normal  rate and regular rhythm.     Heart sounds: No murmur. No friction rub. No gallop.   Pulmonary:     Effort: Pulmonary effort is normal. No respiratory distress.     Breath sounds: Normal breath sounds. No wheezing.  Abdominal:     General: Bowel sounds are normal. There is no distension.     Palpations: Abdomen is soft.     Tenderness: There is no abdominal tenderness.  Musculoskeletal:        General: Normal range of motion.     Cervical back: Normal range of motion and neck supple.     Right lower leg: No tenderness. No edema.     Left lower leg: No tenderness. No edema.  Skin:    General: Skin is warm and dry.  Neurological:     Mental Status: She is alert and oriented to person, place, and time.     ED Results / Procedures / Treatments   Labs (all labs ordered are listed, but only abnormal results are displayed) Labs Reviewed  BASIC METABOLIC PANEL  CBC  TROPONIN I (HIGH SENSITIVITY)    EKG EKG Interpretation  Date/Time:  Monday March 09 2020 17:48:15 EDT Ventricular Rate:  70 PR Interval:  118 QRS Duration: 82 QT Interval:  408 QTC Calculation: 440 R Axis:   58 Text Interpretation: Normal sinus rhythm Possible Left atrial enlargement Anteroseptal infarct , age undetermined Abnormal ECG No prior ecg for comparison Confirmed by Veryl Speak [phone]) on 03/09/2020 6:35:18 PM   Radiology No results found.  Procedures Procedures (including critical care time)  Medications Ordered in ED Medications - No data to display  ED Course  I have reviewed the triage vital signs and the nursing notes.  Pertinent labs & imaging results that were available during my care of the patient were reviewed by me and considered in my medical decision making (see chart for details).    MDM Rules/Calculators/A&P  Patient is a 69 year old female with no prior cardiac history.  She was referred by her primary doctor for evaluation of chest discomfort.  This began yesterday morning  and lasted for approximately 45 minutes.  She has felt fine since.  She went to her doctor's office today to be evaluated who wanted her to come here to be seen.  She was told she had a mildly elevated troponin.  Patient arrived here symptom-free and has been for nearly 36 hours.  Her work-up today shows nonspecific findings on her EKG.  Both troponins here in the ER were negative.  The troponin performed in the office which was reported as positive was 19, both here today are 16.  I feel as though patient  is appropriate for discharge with outpatient cardiology follow-up.  She may benefit from a stress test.  She understands to return in the meantime if symptoms worsen or change.  Final Clinical Impression(s) / ED Diagnoses Final diagnoses:  None    Rx / DC Orders ED Discharge Orders    None       Veryl Speak, MD 03/09/20 2051

## 2020-03-09 NOTE — ED Triage Notes (Signed)
Pt to er, pt states that she is here because her md told her to come to the er, per md pt has irregular ekg and trop elevation.  Pt denies complaints, denies chest pain or sob.

## 2020-03-10 ENCOUNTER — Encounter: Payer: Self-pay | Admitting: Family Medicine

## 2020-03-10 NOTE — Telephone Encounter (Addendum)
Information was sent and reviewed by Cardiology who scheduled follow up for patient 03/26/2020 and Cardiology notified patient today at 10:40am.

## 2020-03-10 NOTE — Telephone Encounter (Signed)
Please let the patient know that we are aware that she had additional negative troponins. (Patient was seen in the ER and was released last night) It is recommended for her to follow-up with cardiology. If she does not have an appointment currently please go ahead and put in for urgent referral--ER doctor recommended to follow-up this week with cardiology due to chest pain (Patient was seen in the office had abnormal troponin sent to the ER 2 additional troponins were negative then she was sent home with recommendations to follow-up ASAP with cardiology)

## 2020-03-26 ENCOUNTER — Ambulatory Visit: Payer: Medicare Other | Admitting: Cardiovascular Disease

## 2020-04-01 ENCOUNTER — Encounter: Payer: Self-pay | Admitting: Cardiology

## 2020-04-01 ENCOUNTER — Other Ambulatory Visit: Payer: Self-pay

## 2020-04-01 ENCOUNTER — Ambulatory Visit (INDEPENDENT_AMBULATORY_CARE_PROVIDER_SITE_OTHER): Payer: Medicare Other | Admitting: Cardiology

## 2020-04-01 ENCOUNTER — Telehealth: Payer: Self-pay | Admitting: Cardiology

## 2020-04-01 VITALS — BP 118/72 | HR 43 | Ht 64.0 in | Wt 106.6 lb

## 2020-04-01 DIAGNOSIS — R001 Bradycardia, unspecified: Secondary | ICD-10-CM

## 2020-04-01 DIAGNOSIS — R002 Palpitations: Secondary | ICD-10-CM

## 2020-04-01 NOTE — Patient Instructions (Signed)
Your physician recommends that you schedule a follow-up appointment in: Agency  Your physician recommends that you continue on your current medications as directed. Please refer to the Current Medication list given to you today.  Your physician recommends that you return for lab work in: [facility]  Your physician has recommended that you wear an event monitor FOR 2 WEEKS. Event monitors are medical devices that record the heart's electrical activity. Doctors most often [place] these monitors to diagnose arrhythmias. Arrhythmias are problems with the speed or rhythm of the heartbeat. The monitor is a small, portable device. You can wear one while you do your normal daily activities. This is usually used to diagnose what is causing palpitations/syncope (passing out).  Thank you for choosing [facility]!!

## 2020-04-01 NOTE — Telephone Encounter (Signed)
Pre-cert Verification for the following procedure    2 WEEK EVENT MONITOR

## 2020-04-01 NOTE — Progress Notes (Signed)
Clinical Summary Pamela Yang is a 69 y.o.female seen as new consult, referred by Dr Wolfgang Phoenix for chest pain  1. Chest pain/Palpitations - seen in ER 03/09/20 with chest pain and palpitations - hstrop neg x 2. EKG SR, nonspecific ST/T changes  - episode occurred at home. Feeling of heart racing, about to beat out of chest. Dizziness, mild tightness.  - lasted about 45 minutes. Self resolved - coffee x 1-2 cups, rare sodas, decaf tea, occasional EtOH - occasional palpitations, 2-3 times per week. Lasts just a few seconds.    Past Medical History:  Diagnosis Date  . GAD (generalized anxiety disorder)   . Gastroparesis 5/08   Dr. Stann Mainland  . GERD (gastroesophageal reflux disease)   . Hearing loss   . Osteoporosis      No Known Allergies   Current Outpatient Medications  Medication Sig Dispense Refill  . Ascorbic Acid (VITAMIN C PO) Take by mouth. daily    . aspirin 81 MG tablet Take 81 mg by mouth daily.    Marland Kitchen CALCIUM-VITAMIN D PO Take by mouth. bid    . citalopram (CELEXA) 10 MG tablet TAKE ONE (1) TABLET BY MOUTH EVERY DAY 90 tablet 1  . denosumab (PROLIA) 60 MG/ML SOLN injection Inject 60 mg into the skin every 6 (six) months. Administer in upper arm, thigh, or abdomen    . Flaxseed, Linseed, (FLAXSEED OIL PO) Take by mouth. 2 daily    . IRON PO Take by mouth. daily    . Multiple Vitamins-Minerals (MULTIVITAMIN WITH MINERALS) tablet Take 1 tablet by mouth daily.     No current facility-administered medications for this visit.     Past Surgical History:  Procedure Laterality Date  . COLONOSCOPY  6/12   Dr Laural Golden     No Known Allergies    Family History  Problem Relation Age of Onset  . Heart attack Mother   . Ovarian cancer Sister      Social History Pamela Yang reports that she has never smoked. She has never used smokeless tobacco. Pamela Yang reports current alcohol use.   Review of Systems CONSTITUTIONAL: No weight loss, fever, chills, weakness  or fatigue.  HEENT: Eyes: No visual loss, blurred vision, double vision or yellow sclerae.No hearing loss, sneezing, congestion, runny nose or sore throat.  SKIN: No rash or itching.  CARDIOVASCULAR: per hpi RESPIRATORY: No shortness of breath, cough or sputum.  GASTROINTESTINAL: No anorexia, nausea, vomiting or diarrhea. No abdominal pain or blood.  GENITOURINARY: No burning on urination, no polyuria NEUROLOGICAL: No headache, dizziness, syncope, paralysis, ataxia, numbness or tingling in the extremities. No change in bowel or bladder control.  MUSCULOSKELETAL: No muscle, back pain, joint pain or stiffness.  LYMPHATICS: No enlarged nodes. No history of splenectomy.  PSYCHIATRIC: No history of depression or anxiety.  ENDOCRINOLOGIC: No reports of sweating, cold or heat intolerance. No polyuria or polydipsia.  Marland Kitchen   Physical Examination Today's Vitals   04/01/20 1037  BP: 118/72  Pulse: (!) 43  SpO2: 98%  Weight: 106 lb 9.6 oz (48.4 kg)  Height: 5\' 4"  (1.626 m)   Body mass index is 18.3 kg/m.  Gen: resting comfortably, no acute distress HEENT: no scleral icterus, pupils equal round and reactive, no palptable cervical adenopathy,  CV: Regular, brady, no m/r/g, no jvd Resp: Clear to auscultation bilaterally GI: abdomen is soft, non-tender, non-distended, normal bowel sounds, no hepatosplenomegaly MSK: extremities are warm, no edema.  Skin: warm, no rash Neuro:  no  focal deficits Psych: appropriate affect    Assessment and Plan  1. Palpitations - symptoms consistent with symptomatic arrhythmia - will plan for 2 week event monitor - with palpitations and low body weight order TSH for her   2. Sinus bradycardia - EKG today actually shows sinus bradycardia rate 49 - she denies any symptoms - follow heart monitor, f/u TSH.       Arnoldo Lenis, M.D

## 2020-04-03 ENCOUNTER — Encounter (INDEPENDENT_AMBULATORY_CARE_PROVIDER_SITE_OTHER): Payer: Medicare Other

## 2020-04-03 DIAGNOSIS — R002 Palpitations: Secondary | ICD-10-CM | POA: Diagnosis not present

## 2020-04-07 ENCOUNTER — Telehealth: Payer: Self-pay | Admitting: *Deleted

## 2020-04-07 NOTE — Telephone Encounter (Signed)
Pt aware - routed to pcp  

## 2020-04-07 NOTE — Telephone Encounter (Signed)
-----   Message from Arnoldo Lenis, MD sent at 04/06/2020  2:16 PM EDT ----- Normal thyroid labs  Zandra Abts MD

## 2020-04-24 ENCOUNTER — Telehealth (INDEPENDENT_AMBULATORY_CARE_PROVIDER_SITE_OTHER): Payer: Medicare Other | Admitting: Cardiology

## 2020-04-24 ENCOUNTER — Encounter: Payer: Self-pay | Admitting: Cardiology

## 2020-04-24 VITALS — BP 111/69 | HR 50 | Ht 64.0 in | Wt 105.0 lb

## 2020-04-24 DIAGNOSIS — R001 Bradycardia, unspecified: Secondary | ICD-10-CM | POA: Diagnosis not present

## 2020-04-24 DIAGNOSIS — R002 Palpitations: Secondary | ICD-10-CM | POA: Diagnosis not present

## 2020-04-24 NOTE — Progress Notes (Signed)
Virtual Visit via Telephone Note   This visit type was conducted due to national recommendations for restrictions regarding the COVID-19 Pandemic (e.g. social distancing) in an effort to limit this patient's exposure and mitigate transmission in our community.  Due to her co-morbid illnesses, this patient is at least at moderate risk for complications without adequate follow up.  This format is felt to be most appropriate for this patient at this time.  The patient did not have access to video technology/had technical difficulties with video requiring transitioning to audio format only (telephone).  All issues noted in this document were discussed and addressed.  No physical exam could be performed with this format.  Please refer to the patient's chart for her  consent to telehealth for [facility].   The patient was identified using 2 identifiers.  Date:  04/24/2020   ID:  Pamela Yang, DOB Aug 26, 1951, MRN [id number]  Patient Location: Home Provider Location: Office  PCP:  Kathyrn Drown, MD  Cardiologist:  Carlyle Dolly, MD  Electrophysiologist:  None   Evaluation Performed:  Follow-Up Visit  Chief Complaint:  Follow up visit  History of Present Illness:    Pamela Yang is a 69 y.o. female seen today for follow up of the following medical problems.    1. Chest pain/Palpitations - seen in ER 03/09/20 with chest pain and palpitations - hstrop neg x 2. EKG SR, nonspecific ST/T changes  - episode occurred at home. Feeling of heart racing, about to beat out of chest. Dizziness, mild tightness.  - lasted about 45 minutes. Self resolved - coffee x 1-2 cups, rare sodas, decaf tea, occasional EtOH - occasional palpitations, 2-3 times per week. Lasts just a few seconds.     03/2020 14 day event monitor Min HR 41, Max HR 113, Avg HR 56. Min HR occurred in early AM hours presumably while sleeping  Reported symptoms correlated with mild sinus bradycardia high 50s No  significant arrhythmias. Mildly decreased average heart rate.   - palpitaitons have resolved since last visit. - no lightheadedness or dizziness.     The patient does not have symptoms concerning for COVID-19 infection (fever, chills, cough, or new shortness of breath).    Past Medical History:  Diagnosis Date  . GAD (generalized anxiety disorder)   . Gastroparesis 5/08   Dr. Stann Mainland  . GERD (gastroesophageal reflux disease)   . Hearing loss   . Osteoporosis    Past Surgical History:  Procedure Laterality Date  . COLONOSCOPY  6/12   Dr Laural Golden     Current Meds  Medication Sig  . Ascorbic Acid (VITAMIN C PO) Take by mouth. daily  . aspirin 81 MG tablet Take 81 mg by mouth daily.  Marland Kitchen CALCIUM-VITAMIN D PO Take by mouth. bid  . citalopram (CELEXA) 10 MG tablet TAKE ONE (1) TABLET BY MOUTH EVERY DAY  . denosumab (PROLIA) 60 MG/ML SOLN injection Inject 60 mg into the skin every 6 (six) months. Administer in upper arm, thigh, or abdomen  . Flaxseed, Linseed, (FLAXSEED OIL PO) Take by mouth. 2 daily  . IRON PO Take by mouth. daily  . Multiple Vitamins-Minerals (MULTIVITAMIN WITH MINERALS) tablet Take 1 tablet by mouth daily.     Allergies:   Patient has no known allergies.   Social History   Tobacco Use  . Smoking status: Former Smoker    Quit date: 12/20/1979    Years since quitting: 40.3  . Smokeless tobacco: Never Used  Substance  Use Topics  . Alcohol use: Yes  . Drug use: Never     Family Hx: The patient's family history includes Heart attack in her mother; Ovarian cancer in her sister.  ROS:   Please see the history of present illness.     All other systems reviewed and are negative.   Prior CV studies:   The following studies were reviewed today:  03/2020 monitor   14 day event monitor  Min HR 41, Max HR 113, Avg HR 56. Min HR occurred in early AM hours presumably while sleeping  Reported symptoms correlated with mild sinus bradycardia high 50s  No  significant arrhythmias. Mildly decreased average heart rate.   Labs/Other Tests and Data Reviewed:    EKG:  No ECG reviewed.  Recent Labs: 09/09/2019: ALT 24 03/09/2020: BUN 21; Creatinine, Ser 0.74; Hemoglobin 12.6; Platelets 163; Potassium 3.5; Sodium 140   Recent Lipid Panel Lab Results  Component Value Date/Time   CHOL 207 (H) 09/09/2019 08:08 AM   TRIG 88 09/09/2019 08:08 AM   HDL 81 09/09/2019 08:08 AM   CHOLHDL 2.6 09/09/2019 08:08 AM   CHOLHDL 2.9 12/23/2014 08:40 AM   LDLCALC 111 (H) 09/09/2019 08:08 AM    Wt Readings from Last 3 Encounters:  04/24/20 105 lb (47.6 kg)  04/01/20 106 lb 9.6 oz (48.4 kg)  03/09/20 104 lb 9.6 oz (47.4 kg)     Objective:    Vital Signs:  BP 111/69   Pulse (!) 50   Ht 5\' 4"  (1.626 m)   Wt 105 lb (47.6 kg)   BMI 18.02 kg/m    Normal affect. Normal speech pattern and tone. Comfortable, no apparent distress. No audible signs of sob or wheezing.   ASSESSMENT & PLAN:     1. Palpitations -benign event monitor other than mild bradycardia - no recurrence of palpitations, essentially isolated severe episdoe back in 02/2020 - continue to monitor at this time .   2. Sinus bradycardia - avg HR 56 on recent monitor, rates into 40s at times - asymptomatic, normal TSH, not on av nodal agents - continue to monitor at this time.      COVID-19 Education: The signs and symptoms of COVID-19 were discussed with the patient and how to seek care for testing (follow up with PCP or arrange E-visit).  The importance of social distancing was discussed today.  Time:   Today, I have spent 15 minutes with the patient with telehealth technology discussing the above problems.     Medication Adjustments/Labs and Tests Ordered: Current medicines are reviewed at length with the patient today.  Concerns regarding medicines are outlined above.   Tests Ordered: No orders of the defined types were placed in this encounter.   Medication Changes: No  orders of the defined types were placed in this encounter.   Follow Up:  Either In Person or Virtual in 1 year(s)  Signed, Carlyle Dolly, MD  04/24/2020 9:48 AM    [place]

## 2020-04-24 NOTE — Patient Instructions (Addendum)
Medication Instructions:   Your physician recommends that you continue on your current medications as directed. Please refer to the Current Medication list given to you today.  Labwork:  NONE  Testing/Procedures:  NONE  Follow-Up:  Your physician recommends that you schedule a follow-up appointment in: 1 year (office or virtual). You will receive a reminder letter in the mail in about 10 months reminding you to call and schedule your appointment. If you don't receive this letter, please contact our office.  Any Other Special Instructions Will Be Listed Below (If Applicable).  If you need a refill on your cardiac medications before your next appointment, please call your pharmacy. 

## 2020-05-08 DIAGNOSIS — M810 Age-related osteoporosis without current pathological fracture: Secondary | ICD-10-CM | POA: Diagnosis not present

## 2020-05-19 ENCOUNTER — Encounter: Payer: Self-pay | Admitting: Family Medicine

## 2020-05-22 ENCOUNTER — Encounter (HOSPITAL_COMMUNITY): Payer: Self-pay

## 2020-05-22 ENCOUNTER — Other Ambulatory Visit: Payer: Self-pay

## 2020-05-22 ENCOUNTER — Emergency Department (HOSPITAL_COMMUNITY)
Admission: EM | Admit: 2020-05-22 | Discharge: 2020-05-22 | Disposition: A | Discharge status: Home / Self Care | Payer: Medicare Other | Attending: Emergency Medicine | Admitting: Emergency Medicine

## 2020-05-22 ENCOUNTER — Other Ambulatory Visit: Payer: Self-pay | Admitting: Student

## 2020-05-22 DIAGNOSIS — R002 Palpitations: Secondary | ICD-10-CM

## 2020-05-22 DIAGNOSIS — Z7982 Long term (current) use of aspirin: Secondary | ICD-10-CM | POA: Insufficient documentation

## 2020-05-22 DIAGNOSIS — I4891 Unspecified atrial fibrillation: Secondary | ICD-10-CM | POA: Insufficient documentation

## 2020-05-22 DIAGNOSIS — R001 Bradycardia, unspecified: Secondary | ICD-10-CM

## 2020-05-22 DIAGNOSIS — Z87891 Personal history of nicotine dependence: Secondary | ICD-10-CM | POA: Diagnosis not present

## 2020-05-22 DIAGNOSIS — I471 Supraventricular tachycardia, unspecified: Secondary | ICD-10-CM | POA: Insufficient documentation

## 2020-05-22 DIAGNOSIS — I480 Paroxysmal atrial fibrillation: Secondary | ICD-10-CM | POA: Diagnosis not present

## 2020-05-22 LAB — CBC
HCT: 42.4 % (ref 36.0–46.0)
Hemoglobin: 13.2 g/dL (ref 12.0–15.0)
MCH: 27.8 pg (ref 26.0–34.0)
MCHC: 31.1 g/dL (ref 30.0–36.0)
MCV: 89.5 fL (ref 80.0–100.0)
Platelets: 175 10*3/uL (ref 150–400)
RBC: 4.74 MIL/uL (ref 3.87–5.11)
RDW: 14 % (ref 11.5–15.5)
WBC: 5.1 10*3/uL (ref 4.0–10.5)
nRBC: 0 % (ref 0.0–0.2)

## 2020-05-22 LAB — BASIC METABOLIC PANEL
Anion gap: 11 (ref 5–15)
BUN: 18 mg/dL (ref 8–23)
CO2: 27 mmol/L (ref 22–32)
Calcium: 9.7 mg/dL (ref 8.9–10.3)
Chloride: 103 mmol/L (ref 98–111)
Creatinine, Ser: 0.82 mg/dL (ref 0.44–1.00)
GFR calc Af Amer: >60 mL/min (ref >60)
GFR calc non Af Amer: >60 mL/min (ref >60)
Glucose, Bld: 128 mg/dL — ABNORMAL HIGH (ref 70–99)
Potassium: 3.6 mmol/L (ref 3.5–5.1)
Sodium: 141 mmol/L (ref 135–145)

## 2020-05-22 MED ORDER — RIVAROXABAN 15 MG PO TABS
15.0000 mg | ORAL_TABLET | Freq: Every day | ORAL | 0 refills | Status: DC
Start: 2020-05-22 — End: 2020-05-22

## 2020-05-22 MED ORDER — DILTIAZEM LOAD VIA INFUSION
20.0000 mg | Freq: Once | INTRAVENOUS | Status: AC
  Administered 2020-05-22: 20 mg via INTRAVENOUS
  Filled 2020-05-22: qty 20

## 2020-05-22 MED ORDER — FLECAINIDE ACETATE 50 MG PO TABS
50.0000 mg | ORAL_TABLET | Freq: Two times a day (BID) | ORAL | Status: DC
  Filled 2020-05-22 (×5): qty 1

## 2020-05-22 MED ORDER — RIVAROXABAN 15 MG PO TABS
15.0000 mg | ORAL_TABLET | Freq: Every day | ORAL | Status: DC
  Filled 2020-05-22 (×3): qty 1

## 2020-05-22 MED ORDER — FLECAINIDE ACETATE 50 MG PO TABS
200.0000 mg | ORAL_TABLET | Freq: Once | ORAL | Status: DC
  Filled 2020-05-22: qty 2

## 2020-05-22 MED ORDER — DILTIAZEM HCL-DEXTROSE 125-5 MG/125ML-% IV SOLN (PREMIX)
5.0000 mg/h | INTRAVENOUS | Status: DC
  Administered 2020-05-22: 5 mg/h via INTRAVENOUS
  Filled 2020-05-22: qty 125

## 2020-05-22 MED ORDER — FLECAINIDE ACETATE 50 MG PO TABS
50.0000 mg | ORAL_TABLET | Freq: Two times a day (BID) | ORAL | 5 refills | Status: DC
Start: 2020-05-22 — End: 2020-11-30

## 2020-05-22 MED ORDER — FLECAINIDE ACETATE 50 MG PO TABS
50.0000 mg | ORAL_TABLET | Freq: Two times a day (BID) | ORAL | 0 refills | Status: DC
Start: 2020-05-22 — End: 2020-05-22

## 2020-05-22 MED ORDER — APIXABAN 5 MG PO TABS
5.0000 mg | ORAL_TABLET | Freq: Two times a day (BID) | ORAL | Status: DC
  Administered 2020-05-22: 5 mg via ORAL
  Filled 2020-05-22: qty 1

## 2020-05-22 MED ORDER — SODIUM CHLORIDE 0.9 % IV BOLUS
1000.0000 mL | Freq: Once | INTRAVENOUS | Status: AC
  Administered 2020-05-22: 1000 mL via INTRAVENOUS

## 2020-05-22 MED ORDER — FLECAINIDE ACETATE 50 MG PO TABS: 50.0000 mg | ORAL_TABLET | Freq: Two times a day (BID) | ORAL | 5 refills | Status: DC

## 2020-05-22 MED ORDER — APIXABAN 5 MG PO TABS
5.0000 mg | ORAL_TABLET | Freq: Two times a day (BID) | ORAL | 5 refills | Status: DC
Start: 2020-05-22 — End: 2020-11-30

## 2020-05-22 NOTE — Consult Note (Addendum)
Cardiology Consult    Patient ID: [name]; [id number]; [birth year]   Admit date: 05/22/2020 Date of Consult: 05/22/2020  Primary Care Provider: Kathyrn Drown, MD Primary Cardiologist: Pamela Dolly, MD   Patient Profile    Pamela Yang is a 69 y.o. female with past medical history of GERD and palpitations who is being seen today for the evaluation of atrial fibrillation with RVR at the request of Pamela Yang.   History of Present Illness    Pamela Yang was referred to Pamela Yang in 03/2020 after recent ED evaluation for chest pain and palpitations during which troponin values were negative and her EKG showed no acute changes. She did report occasional palpitations a few times per week which would last for a few seconds and spontaneously resolve. TSH was within a normal range and prior labs showed no significant electrolyte abnormalities. She did wear a 14-day event monitor which showed normal sinus rhythm with mild episodes of sinus bradycardia but no significant arrhythmias. Her average heart rate was 56 bpm with a minimum of 41 bpm during the early AM hours and a maximum of 113 bpm. She denied any recurrent symptoms at the time of follow-up on 04/24/2020 and continued monitoring was recommended. She was not started on an AV nodal blocking agent given baseline bradycardia.  She presented to the ED this morning for evaluation of palpitations which started around 0545. Reports being in her usual state of health over the past few days but developed acute palpitations this morning. She denies any associated lightheadedness, dizziness or presyncope. No recent orthopnea, PND or lower extremity edema.  Initial labs show WBC 5.1, Hgb 13.2, platelets 175, Na+ 141, K+ 3.6 and creatinine 0.82. EKG shows atrial flutter with RVR, HR 128.  IV Cardizem 20 mg was administered and she was started on IV Cardizem for rate control. Flecainide 200 mg was ordered but she actually spontaneously  converted back to normal sinus rhythm around 1050 with a 3.89-second postconversion pause. She is now in sinus bradycardia with heart rate in the 30's to 40's.  Past Medical History:  Diagnosis Date   GAD (generalized anxiety disorder)    Gastroparesis 5/08   Pamela Yang   GERD (gastroesophageal reflux disease)    Hearing loss    Osteoporosis     Past Surgical History:  Procedure Laterality Date   COLONOSCOPY  6/12   Pamela Yang     Home Medications:  Prior to Admission medications   Medication Sig Start Date End Date Taking? Authorizing Provider  Ascorbic Acid (VITAMIN C PO) Take by mouth. daily    [provider]  aspirin 81 MG tablet Take 81 mg by mouth daily.    [provider]  CALCIUM-VITAMIN D PO Take by mouth. bid    [provider]  citalopram (CELEXA) 10 MG tablet TAKE ONE (1) TABLET BY MOUTH EVERY DAY 09/23/19   Pamela Drown, MD  denosumab (PROLIA) 60 MG/ML SOLN injection Inject 60 mg into the skin every 6 (six) months. Administer in upper arm, thigh, or abdomen    [provider]  Flaxseed, Linseed, (FLAXSEED OIL PO) Take by mouth. 2 daily    [provider]  IRON PO Take by mouth. daily    [provider]  Multiple Vitamins-Minerals (MULTIVITAMIN WITH MINERALS) tablet Take 1 tablet by mouth daily.    [provider]    Inpatient Medications: Scheduled Meds:   Continuous Infusions:  diltiazem (CARDIZEM) infusion 5  mg/hr (05/22/20 0806)   PRN Meds:   Allergies:   No Known Allergies  Social History:   Social History   Socioeconomic History   Marital status: Married    Spouse name: Not on file   Number of children: Not on file   Years of education: Not on file   Highest education level: Not on file  Occupational History   Not on file  Tobacco Use   Smoking status: Former Smoker    Quit date: 12/20/1979    Years since quitting: 40.4   Smokeless tobacco: Never Used  Substance and Sexual  Activity   Alcohol use: Yes   Drug use: Never   Sexual activity: Yes    Birth control/protection: Post-menopausal  Other Topics Concern   Not on file  Social History Narrative   Not on file   Social Determinants of Health   Financial Resource Strain:    Difficulty of Paying Living Expenses:   Food Insecurity:    Worried About Charity fundraiser in the Last Year:    Arboriculturist in the Last Year:   Transportation Needs:    Film/video editor (Medical):    Lack of Transportation (Non-Medical):   Physical Activity:    Days of Exercise per Week:    Minutes of Exercise per Session:   Stress:    Feeling of Stress :   Social Connections:    Frequency of Communication with Friends and Family:    Frequency of Social Gatherings with Friends and Family:    Attends Religious Services:    Active Member of Clubs or Organizations:    Attends Music therapist:    Marital Status:   Intimate Partner Violence:    Fear of Current or Ex-Partner:    Emotionally Abused:    Physically Abused:    Sexually Abused:      Family History:    Family History  Problem Relation Age of Onset   Heart attack Mother    Ovarian cancer Sister       Review of Systems    General:  No chills, fever, night sweats or weight changes.  Cardiovascular:  No chest pain, dyspnea on exertion, edema, orthopnea,  paroxysmal nocturnal dyspnea. Positive for palpitations.  Dermatological: No rash, lesions/masses Respiratory: No cough, dyspnea Urologic: No hematuria, dysuria Abdominal:   No nausea, vomiting, diarrhea, bright red blood per rectum, melena, or hematemesis Neurologic:  No visual changes, wkns, changes in mental status. All other systems reviewed and are otherwise negative except as noted above.  Physical Exam/Data    Vitals:   05/22/20 0830 05/22/20 0900 05/22/20 0930 05/22/20 1000  BP: 95/84 116/73 105/75 108/82  Pulse: (!) 51 61 64 73  Resp: 16 19 16 16   Temp:        TempSrc:      SpO2: 100% 100% 100% 100%  Weight:      Height:       No intake or output data in the 24 hours ending 05/22/20 1101 Filed Weights   05/22/20 0728  Weight: 47.6 kg   Body mass index is 18.02 kg/m.   General: Pleasant Caucasian female appearing in NAD Psych: Normal affect. Neuro: Alert and oriented X 3. Moves all extremities spontaneously. HEENT: Normal  Neck: Supple without bruits or JVD. Lungs:  Resp regular and unlabored, CTA without wheezing or rales. Heart: Regular rhythm, bradycardiac rate. No s3, s4, or murmurs. Abdomen: Soft, non-tender, non-distended, BS + x 4.  Extremities: No clubbing, cyanosis or edema. DP/PT/Radials 2+ and equal bilaterally.   EKG:  The EKG was personally reviewed and demonstrates: Atrial flutter with RVR, HR 128.   Labs/Studies     Relevant CV Studies:  Event Monitor: 03/2020 14 day event monitor Min HR 41, Max HR 113, Avg HR 56. Min HR occurred in early AM hours presumably while sleeping Reported symptoms correlated with mild sinus bradycardia high 50s No significant arrhythmias. Mildly decreased average heart rate.     Laboratory Data:  Chemistry Recent Labs  Lab 05/22/20 0802  NA 141  K 3.6  CL 103  CO2 27  GLUCOSE 128*  BUN 18  CREATININE 0.82  CALCIUM 9.7  GFRNONAA >60  GFRAA >60  ANIONGAP 11    No results for input(s): PROT, ALBUMIN, AST, ALT, ALKPHOS, BILITOT in the last 168 hours. Hematology Recent Labs  Lab 05/22/20 0802  WBC 5.1  RBC 4.74  HGB 13.2  HCT 42.4  MCV 89.5  MCH 27.8  MCHC 31.1  RDW 14.0  PLT 175   Cardiac EnzymesNo results for input(s): TROPONINI in the last 168 hours. No results for input(s): TROPIPOC in the last 168 hours.  BNPNo results for input(s): BNP, PROBNP in the last 168 hours.  DDimer No results for input(s): DDIMER in the last 168 hours.  Radiology/Studies:  No results found.   Assessment & Plan    1. Palpitations/Paroxysmal Atrial Flutter - She  experienced prior palpitations in 02/2020 but converted to normal sinus rhythm prior to ED arrival. Recent monitor showed no significant arrhythmias. Developed recurrent palpitations this morning and was found to be in new onset atrial flutter with RVR. - Electrolytes are within a normal range. TSH stable when checked last month. - She was started on IV Cardizem and Flecainide was ordered but she spontaneously converted to normal sinus rhythm at 1050 with a 3.89 second pause. Feels back to baseline at this time.  - I have asked nursing staff to discontinue IV Cardizem given that her heart rate is currently in the 30's to 40's. She is not going to tolerate AV nodal blocking agents given baseline heart rate in the 50's by prior monitor. Will discuss antiarrhythmic therapy with Pamela. Harrington Challenger. She will need an echocardiogram which can be arranged in the outpatient setting if not obtained today. - This patients CHA2DS2-VASc Score and unadjusted Ischemic Stroke Rate (% per year) is equal to 2.2 % stroke rate/year from a score of 2 (Age, Female). Would start anticoagulation with Eliquis 5mg  BID.    For questions or updates, please contact [facility] Please consult [email or web address] for contact info under Cardiology/STEMI.  Signed, Erma Heritage, PA-C 05/22/2020, 11:01 AM Pager: [phone]  Patient seen and examined   I agree with findings as noted above by B Strader above  Pt is a 69 yo with hx of palpitations    recent monitor did not show significant arrhythmia. Today woke up and then felt heart racing   No dizziness Came to ED   Found to be in afib with RVR   After IV diltiazem started patient converted to SB   There was a 3.9 sec pause a conversion  ON exam Pt a thin 69 yo in NAD Neck:  No JVD  No bruits Lungs   CTA  No rales Cardiac RRR   No S3  No murmurs   Abd supple Ext  No LE edema   Recomm:    I would recomm Flecanide 50 mg  bid   F/U EKG in 10 days     Pt will need anticoagulation    Prefers Xarelto daily  Stop ASA Would give 30 mg diltiazem to take as needed for tachycardia  I dont't think she would tolerate any scheduled nodal agent Will make sure she has follow up in clnic   Dorris Carnes MD

## 2020-05-22 NOTE — ED Provider Notes (Signed)
[facility] EMERGENCY DEPARTMENT Provider Note   CSN: [id number] Arrival date & time: 05/22/20  0719     History Chief Complaint  Patient presents with  . Palpitations    Pamela Yang is a 69 y.o. female.  Patient c/o palpitations acute onset early this AM. States feels fast and irregular. Symptoms acute onset, moderate, persistent, at rest. Notes one prior episode but states ED visit, and cardiology Holter, failed to identify dysrhythmia. Patient denies chest pain or discomfort. No sob or unusual doe. No recent wt change, skin/hair changes, heat intolerance, or hx thyroid dis - reports recent thyroid test 'normal'. 1-2 caffeine drinks per day. No drug use. No heavy etoh use. No recent change in meds/new meds. No personal or fam hx cad.   The history is provided by the patient.  Palpitations Associated symptoms: no back pain, no chest pain and no shortness of breath        Past Medical History:  Diagnosis Date  . GAD (generalized anxiety disorder)   . Gastroparesis 5/08   Dr. Stann Mainland  . GERD (gastroesophageal reflux disease)   . Hearing loss   . Osteoporosis     Patient Active Problem List   Diagnosis Date Noted  . Depression with anxiety 02/28/2018  . Hyperlipidemia 12/31/2014  . Osteoporosis 08/13/2014  . SORE THROAT 03/31/2009  . DRY MOUTH 03/31/2009  . GASTROPARESIS 03/31/2009  . CONSTIPATION 03/31/2009  . NAUSEA 03/31/2009  . HEARTBURN 03/31/2009  . OTHER DYSPHAGIA 03/31/2009  . EPIGASTRIC PAIN 03/31/2009  . DEPRESSION, HX OF 03/31/2009    Past Surgical History:  Procedure Laterality Date  . COLONOSCOPY  6/12   Dr Laural Golden     OB History   No obstetric history on file.     Family History  Problem Relation Age of Onset  . Heart attack Mother   . Ovarian cancer Sister     Social History   Tobacco Use  . Smoking status: Former Smoker    Quit date: 12/20/1979    Years since quitting: 40.4  . Smokeless tobacco: Never Used  Substance Use Topics    . Alcohol use: Yes  . Drug use: Never    Home Medications Prior to Admission medications   Medication Sig Start Date End Date Taking? Authorizing Provider  Ascorbic Acid (VITAMIN C PO) Take by mouth. daily    [provider]  aspirin 81 MG tablet Take 81 mg by mouth daily.    [provider]  CALCIUM-VITAMIN D PO Take by mouth. bid    [provider]  citalopram (CELEXA) 10 MG tablet TAKE ONE (1) TABLET BY MOUTH EVERY DAY 09/23/19   Kathyrn Drown, MD  denosumab (PROLIA) 60 MG/ML SOLN injection Inject 60 mg into the skin every 6 (six) months. Administer in upper arm, thigh, or abdomen    [provider]  Flaxseed, Linseed, (FLAXSEED OIL PO) Take by mouth. 2 daily    [provider]  IRON PO Take by mouth. daily    [provider]  Multiple Vitamins-Minerals (MULTIVITAMIN WITH MINERALS) tablet Take 1 tablet by mouth daily.    [provider]    Allergies    Patient has no known allergies.  Review of Systems   Review of Systems  Constitutional: Negative for chills and fever.  Eyes: Negative for redness.  Respiratory: Negative for shortness of breath.   Cardiovascular: Positive for palpitations. Negative for chest pain and leg swelling.  Gastrointestinal: Negative for abdominal pain.  Genitourinary: Negative for flank pain.  Musculoskeletal: Negative for back pain and neck pain.  Skin: Negative for rash.  Neurological: Negative for headaches.  Hematological: Does not bruise/bleed easily.  Psychiatric/Behavioral: Negative for confusion.    Physical Exam Updated Vital Signs BP 95/84   Pulse (!) 51   Temp (!) 97.5 F (36.4 C) (Oral)   Resp 16   Ht 1.626 m (5\' 4" )   Wt 47.6 kg   SpO2 100%   BMI 18.02 kg/m   Physical Exam Vitals and nursing note reviewed.  Constitutional:      Appearance: Normal appearance. She is well-developed.  HENT:     Head: Atraumatic.     Nose: Nose normal.     Mouth/Throat:      Mouth: Mucous membranes are moist.  Eyes:     General: No scleral icterus.    Conjunctiva/sclera: Conjunctivae normal.  Neck:     Trachea: No tracheal deviation.     Comments: Thyroid not grossly enlarged or tender.  Cardiovascular:     Rate and Rhythm: Tachycardia present. Rhythm irregular.     Pulses: Normal pulses.     Heart sounds: Normal heart sounds. No murmur. No friction rub. No gallop.   Pulmonary:     Effort: Pulmonary effort is normal. No respiratory distress.     Breath sounds: Normal breath sounds.  Abdominal:     General: Bowel sounds are normal. There is no distension.     Palpations: Abdomen is soft.     Tenderness: There is no abdominal tenderness. There is no guarding.  Genitourinary:    Comments: No cva tenderness.  Musculoskeletal:        General: No swelling or tenderness.     Cervical back: Normal range of motion and neck supple. No rigidity. No muscular tenderness.     Right lower leg: No edema.     Left lower leg: No edema.  Skin:    General: Skin is warm and dry.     Findings: No rash.  Neurological:     Mental Status: She is alert.     Comments: Alert, speech normal. Steady gait.   Psychiatric:        Mood and Affect: Mood normal.     ED Results / Procedures / Treatments   Labs (all labs ordered are listed, but only abnormal results are displayed) Results for orders placed or performed during the hospital encounter of 05/22/20  CBC  Result Value Ref Range   WBC 5.1 4.0 - 10.5 K/uL   RBC 4.74 3.87 - 5.11 MIL/uL   Hemoglobin 13.2 12.0 - 15.0 g/dL   HCT 42.4 36.0 - 46.0 %   MCV 89.5 80.0 - 100.0 fL   MCH 27.8 26.0 - 34.0 pg   MCHC 31.1 30.0 - 36.0 g/dL   RDW 14.0 11.5 - 15.5 %   Platelets 175 150 - 400 K/uL   nRBC 0.0 0.0 - 0.2 %  Basic metabolic panel  Result Value Ref Range   Sodium 141 135 - 145 mmol/L   Potassium 3.6 3.5 - 5.1 mmol/L   Chloride 103 98 - 111 mmol/L   CO2 27 22 - 32 mmol/L   Glucose, Bld 128 (H) 70 - 99 mg/dL   BUN  18 8 - 23 mg/dL   Creatinine, Ser 0.82 0.44 - 1.00 mg/dL   Calcium 9.7 8.9 - 10.3 mg/dL   GFR calc non Af Amer >60 >60 mL/min   GFR calc Af Amer >60 >  60 mL/min   Anion gap 11 5 - 15   No results found.  EKG EKG Interpretation  Date/Time:  Friday May 22 2020 07:32:22 EDT Ventricular Rate:  128 PR Interval:    QRS Duration: 92 QT Interval:  316 QTC Calculation: 462 R Axis:   -16 Text Interpretation: Atrial flutter with predominant 2:1 AV block Non-specific ST-t changes Confirmed by Lajean Saver [phone]) on 05/22/2020 7:36:01 AM   Radiology No results found.  Procedures Procedures (including critical care time)  Medications Ordered in ED Medications  diltiazem (CARDIZEM) 1 mg/mL load via infusion 20 mg (20 mg Intravenous Bolus from Bag 05/22/20 0806)    And  diltiazem (CARDIZEM) 125 mg in dextrose 5% 125 mL (1 mg/mL) infusion (5 mg/hr Intravenous New Bag/Given 05/22/20 0806)    ED Course  I have reviewed the triage vital signs and the nursing notes.  Pertinent labs & imaging results that were available during my care of the patient were reviewed by me and considered in my medical decision making (see chart for details).    MDM Rules/Calculators/A&P                      Continuous pulse ox and monitor. Ecg. Stat labs.   MDM Number of Diagnoses or Management Options   Amount and/or Complexity of Data Reviewed Clinical lab tests: ordered and reviewed Tests in the medicine section of CPT: ordered and reviewed Discussion of test results with the performing providers: yes Decide to obtain previous medical records or to obtain history from someone other than the patient: yes Obtain history from someone other than the patient: yes Review and summarize past medical records: yes Discuss the patient with other providers: yes Independent visualization of images, tracings, or specimens: yes  Risk of Complications, Morbidity, and/or Mortality Presenting problems:  high Diagnostic procedures: high Management options: high   Reviewed nursing notes and prior charts for additional history.   Labs reviewed/interpreted by me - chem normal.   cardizem bolus/gtt.   Recheck, heart rate improved, in 70s, no palpitations. Repeat ecg, still in afib/flutter, rate controlled.  Cardiology consulted - discussed pt with Dr  Harrington Challenger, who reviewed chart - indicates can give flecainide 200 mg po, and she will see in ED.  Recheck, pt converted to sinus brady.   Dr Harrington Challenger has seen in ED, and indicates d/c to home on flecainide 50 mg bid, and xarelto, and have f/u with them in clinic.   Recheck pt, asymptomatic, no cp or sob. No faintness or dizziness.   CRITICAL CARE RE: new onset rapid afib, chemical cardioversion.  Performed by: Mirna Mires Total critical care time: 35 minutes Critical care time was exclusive of separately billable procedures and treating other patients. Critical care was necessary to treat or prevent imminent or life-threatening deterioration. Critical care was time spent personally by me on the following activities: development of treatment plan with patient and/or surrogate as well as nursing, discussions with consultants, evaluation of patient's response to treatment, examination of patient, obtaining history from patient or surrogate, ordering and performing treatments and interventions, ordering and review of laboratory studies, ordering and review of radiographic studies, pulse oximetry and re-evaluation of patient's condition.     Final Clinical Impression(s) / ED Diagnoses Final diagnoses:  None    Rx / DC Orders ED Discharge Orders    None       Lajean Saver, MD 05/22/20 1247

## 2020-05-22 NOTE — Discharge Instructions (Signed)
It was our pleasure to provide your ER care today - we hope that you feel better.  Rest. Drink plenty of fluids.   Take xarelto and flecainide as prescribed. Stop taking  the aspirin.   Follow up with cardiologist in their office in the next 1-2 weeks - call office to verify appointment.   Return to ER if worse, new symptoms, chest pain, trouble breathing, weak/fainting, persistent fast heart beat, or other concern.

## 2020-05-22 NOTE — Clinical Social Work Note (Signed)
Transition of Care [facility]) - Emergency Department Mini Assessment  Patient Details  Name: Pamela Yang MRN: [id number] Date of Birth: 03-25-1951  Transition of Care [facility]) CM/SW Contact:    Sherie Don, LCSW Phone Number: 05/22/2020, 2:48 PM  Clinical Narrative: Patient is a 69 year old female who presented to the ED for Afib. TOC received consult for 30 day Eliquis card. CSW provided patient with 30 day card. TOC signing off.  ED Mini Assessment: What brought you to the Emergency Department? : AFib Barriers to Discharge: ED Barriers Resolved Barrier interventions: 30 Day Eliquis card Means of departure: Car Interventions which prevented an admission or readmission: Medication Review  Admission diagnosis:  AFIB Patient Active Problem List   Diagnosis Date Noted   Depression with anxiety 02/28/2018   Hyperlipidemia 12/31/2014   Osteoporosis 08/13/2014   SORE THROAT 03/31/2009   DRY MOUTH 03/31/2009   GASTROPARESIS 03/31/2009   CONSTIPATION 03/31/2009   NAUSEA 03/31/2009   HEARTBURN 03/31/2009   OTHER DYSPHAGIA 03/31/2009   EPIGASTRIC PAIN 03/31/2009   DEPRESSION, HX OF 03/31/2009   PCP:  Kathyrn Drown, MD Pharmacy:   [facility] [address] [place] [address] Phone: [phone] Fax: [phone]

## 2020-05-22 NOTE — ED Triage Notes (Signed)
Pt began having heart palpitations this morning around 5:45. She states it feels irregular and she has weakness in her legs. This happened 6-8 weeks ago and patient saw a cardiologist. She wore a Holter monitor 2 weeks ago, but nothing significant was found.

## 2020-06-03 ENCOUNTER — Telehealth: Payer: Self-pay | Admitting: Family Medicine

## 2020-06-03 NOTE — Telephone Encounter (Signed)
Pt contacted. Pt states she is going to talk with her cardiologist on Friday about this. Pt states she will wean off Citalopram.

## 2020-06-03 NOTE — Telephone Encounter (Signed)
That would be fine for her to stop the citalopram.  She does not have to wean it.  She can send me a MyChart message update in 3 to 4 weeks how she is feeling and connect with me sooner if any problems.

## 2020-06-03 NOTE — Telephone Encounter (Signed)
Pt contacted and verbalized understanding. Pt states that she will wean herself off of the Citalopram and monitor how she is doing. Pt states she has a visit with the cardiologist on Friday and will bring this up to him. Please advise. Thank you

## 2020-06-03 NOTE — Telephone Encounter (Signed)
Nurses  Please communicate with patient Please let her know that recently it came to her knowledge that she is on flecainide with cardiology.  This particular medication can interact with citalopram to increase the risk of arrhythmia issues.  Her heart medicine is very important and she should continue this.  As for the citalopram there are other alternative medications that could help with anxiousness and mood.  These medications are not controlled meaning that they are not addictive.  I would recommend that we stop the citalopram.  The patient does have some options currently Option #1 stopping the citalopram and we can look into alternative replacements for citalopram.  There are some similar medications that we can do a thorough drug check to make sure they get along with flecainide.  If she is interested in [place] going this route we can do so Option #2 stopping the citalopram and just monitoring herself how she is doing overall mood wise and if she feels she is having a problem she can connect with [place]  Please see what the patient would like to do

## 2020-06-05 ENCOUNTER — Ambulatory Visit: Payer: Medicare Other | Admitting: Physician Assistant

## 2020-06-05 ENCOUNTER — Encounter: Payer: Self-pay | Admitting: Physician Assistant

## 2020-06-05 ENCOUNTER — Other Ambulatory Visit: Payer: Self-pay

## 2020-06-05 ENCOUNTER — Encounter: Payer: Self-pay | Admitting: *Deleted

## 2020-06-05 VITALS — BP 120/64 | HR 46 | Ht 64.0 in | Wt 108.2 lb

## 2020-06-05 DIAGNOSIS — Z5181 Encounter for therapeutic drug level monitoring: Secondary | ICD-10-CM

## 2020-06-05 DIAGNOSIS — I4891 Unspecified atrial fibrillation: Secondary | ICD-10-CM

## 2020-06-05 DIAGNOSIS — Z79899 Other long term (current) drug therapy: Secondary | ICD-10-CM | POA: Diagnosis not present

## 2020-06-05 MED ORDER — DILTIAZEM HCL 30 MG PO TABS: 30.0000 mg | ORAL_TABLET | ORAL | 1 refills | Status: DC | PRN

## 2020-06-05 NOTE — Progress Notes (Addendum)
Cardiology Office Note   Date:  06/05/2020   ID:  Pamela Yang, DOB 1951-12-10, MRN [id number]  PCP:  Kathyrn Drown, MD Cardiologist:  Carlyle Dolly, MD  Electrphysiologist: None Rosaria Ferries, PA-C    History of Present Illness: Pamela Yang is a 69 y.o. female with a history of GERD, GAD, gastroparesis, sinus brady, Afib dx 05/22/2020 on Eliquis, spont conversion to SR w/ 3.89 sec pause, Flecainide 50 mg bid and Dilt 30 mg prn. 06/16 notes regarding citalopram, cannot take w/ Flecainide>>d/c'd  Pamela Yang presents for cardiology follow up.  Since she left the hospital, she has not had any more episodes.  The episode in March was almost exactly like the one in June, she feels it was the same thing, but it only lasted about 45", not caught on the monitor (which she wore later).   She has some orthostatic dizziness, but otherwise no presyncope or syncope. She tolerates the low HR very well.   She has not had any bleeding issues, the Xarelto was changed to Eliquis by the ER MD, she is tolerating it well.  She has a known history of bradycardia, she has had a long time.  She does not feel weak or dizzy.  She does not feel limited in her activity in any way.  She walks at least 5 miles a day, she walks the dogs twice a day and takes them for long walks.  She does this without any chest pain or shortness of breath.  She has social stressors because she is the primary caregiver for her husband, who has dementia.  She feels she is dealing with this fairly well.  She has been on the citalopram for a long time, but is okay with stopping it and then following up with her PCP.   Past Medical History:  Diagnosis Date  . GAD (generalized anxiety disorder)   . Gastroparesis 5/08   Dr. Stann Mainland  . GERD (gastroesophageal reflux disease)   . Hearing loss   . Osteoporosis     Past Surgical History:  Procedure Laterality Date  . COLONOSCOPY  6/12   Dr Laural Golden     Current Outpatient Medications  Medication Sig Dispense Refill  . apixaban (ELIQUIS) 5 MG TABS tablet Take 1 tablet (5 mg total) by mouth 2 (two) times daily. 60 tablet 5  . Ascorbic Acid (VITAMIN C PO) Take 1 tablet by mouth daily.     Marland Kitchen CALCIUM-VITAMIN D PO Take 1 tablet by mouth in the morning and at bedtime. bid     . denosumab (PROLIA) 60 MG/ML SOLN injection Inject 60 mg into the skin every 6 (six) months. Administer in upper arm, thigh, or abdomen    . Flaxseed, Linseed, (FLAXSEED OIL PO) Take 2 tablets by mouth daily.     . flecainide (TAMBOCOR) 50 MG tablet Take 1 tablet (50 mg total) by mouth 2 (two) times daily. 60 tablet 5  . IRON PO Take 1 tablet by mouth daily.     . Multiple Vitamins-Minerals (MULTIVITAMIN WITH MINERALS) tablet Take 1 tablet by mouth daily.    Marland Kitchen Propylene Glycol (SYSTANE BALANCE) 0.6 % SOLN Apply 2 drops to eye in the morning and at bedtime. Into both eyes    . citalopram (CELEXA) 10 MG tablet TAKE ONE (1) TABLET BY MOUTH EVERY DAY (Patient not taking: Reported on 06/05/2020) 90 tablet 1  . diltiazem (CARDIZEM) 30 MG tablet Take 1 tablet (30 mg total) by mouth  as needed (atrial fibrillation). 30 tablet 1   No current facility-administered medications for this visit.    Allergies:   Patient has no known allergies.    Social History:  The patient  reports that she quit smoking about 40 years ago. She has never used smokeless tobacco. She reports current alcohol use. She reports that she does not use drugs.   Family History:  The patient's family history includes Heart attack in her mother; Ovarian cancer in her sister.  She indicated that the status of her mother is unknown. She indicated that the status of her sister is unknown.   ROS:  Please see the history of present illness. All other systems are reviewed and negative.    PHYSICAL EXAM: VS:  BP 120/64   Pulse (!) 46   Ht 5\' 4"  (1.626 m)   Wt 108 lb 3.2 oz (49.1 kg)   BMI 18.57 kg/m  , BMI  Body mass index is 18.57 kg/m. GEN: Well nourished, well developed, female in no acute distress HEENT: normal for age  Neck: no JVD, no carotid bruit, no masses Cardiac: RRR; no murmur, no rubs, or gallops Respiratory:  clear to auscultation bilaterally, normal work of breathing GI: soft, nontender, nondistended, + BS MS: no deformity or atrophy; no edema; distal pulses are 2+ in all 4 extremities  Skin: warm and dry, no rash Neuro:  Strength and sensation are intact Psych: euthymic mood, full affect   EKG:  EKG is ordered today. The ekg ordered today demonstrates sinus bradycardia, heart rate 46, no acute ischemic changes   Event Monitor: 03/2020  14 day event monitor  Min HR 41, Max HR 113, Avg HR 56. Min HR occurred in early AM hours presumably while sleeping  Reported symptoms correlated with mild sinus bradycardia high 50s  No significant arrhythmias. Mildly decreased average heart rate.   Recent Labs: 09/09/2019: ALT 24 05/22/2020: BUN 18; Creatinine, Ser 0.82; Hemoglobin 13.2; Platelets 175; Potassium 3.6; Sodium 141  CBC    Component Value Date/Time   WBC 5.1 05/22/2020 0802   RBC 4.74 05/22/2020 0802   HGB 13.2 05/22/2020 0802   HGB 12.8 08/19/2016 0823   HCT 42.4 05/22/2020 0802   HCT 39.6 08/19/2016 0823   PLT 175 05/22/2020 0802   PLT 167 08/19/2016 0823   MCV 89.5 05/22/2020 0802   MCV 85 08/19/2016 0823   MCH 27.8 05/22/2020 0802   MCHC 31.1 05/22/2020 0802   RDW 14.0 05/22/2020 0802   RDW 15.2 08/19/2016 0823   LYMPHSABS 1.5 11/30/2018 1357   LYMPHSABS 2.1 08/19/2016 0823   MONOABS 0.5 11/30/2018 1357   EOSABS 0.1 11/30/2018 1357   EOSABS 0.2 08/19/2016 0823   BASOSABS 0.1 11/30/2018 1357   BASOSABS 0.0 08/19/2016 0823   CMP Latest Ref Rng & Units 05/22/2020 03/09/2020 09/09/2019  Glucose 70 - 99 mg/dL 128(H) 90 99  BUN 8 - 23 mg/dL 18 21 13   Creatinine 0.44 - 1.00 mg/dL 0.82 0.74 1.05(H)  Sodium 135 - 145 mmol/L 141 140 143  Potassium 3.5 - 5.1  mmol/L 3.6 3.5 4.6  Chloride 98 - 111 mmol/L 103 105 105  CO2 22 - 32 mmol/L 27 26 26   Calcium 8.9 - 10.3 mg/dL 9.7 9.6 10.5(H)  Total Protein 6.0 - 8.5 g/dL - - 6.9  Total Bilirubin 0.0 - 1.2 mg/dL - - 0.6  Alkaline Phos 39 - 117 IU/L - - 77  AST 0 - 40 IU/L - - 27  ALT  0 - 32 IU/L - - 24     Lipid Panel Lab Results  Component Value Date   CHOL 207 (H) 09/09/2019   HDL 81 09/09/2019   LDLCALC 111 (H) 09/09/2019   TRIG 88 09/09/2019   CHOLHDL 2.6 09/09/2019      Wt Readings from Last 3 Encounters:  06/05/20 108 lb 3.2 oz (49.1 kg)  05/22/20 105 lb (47.6 kg)  04/24/20 105 lb (47.6 kg)     Other studies Reviewed: Additional studies/ records that were reviewed today include: Office notes, hospital records and testing.  ASSESSMENT AND PLAN:  1.  PAF: -She has only had 2 episodes. -Although she was not on the monitor, it is extremely likely that the episode of palpitations that she had in March was an episode of atrial fibrillation. -After being started on IV Cardizem, she spontaneously converted to sinus rhythm. -However, she has significant resting bradycardia with heart rates in the 40s, so cannot be on a daily beta-blocker/CCB -CHA2DS2-VASc = (age x 1, female) -She is tolerating the Eliquis well -She is given diltiazem 30 mg to take as needed when she has an elevated heart rate.  She understands that because her resting heart rate is slow, she needs to be careful with it. -Because of the flecainide, she will not be able to remain on the Celexa.  The risk is QT prolongation.  Her QT/QTc is 484/423 ms now -I explained that she has been on both of them for a while with no change in in her QT, but she does not wish to take the risk.  After discussion, she prefers to stop the Celexa and follow-up with her PCP for additional medical therapy for depression if needed. -Because of the flecainide, she will need a treadmill.  This will be scheduled.   Current medicines are reviewed  at length with the patient today.  The patient has concerns regarding medicines.  Concerns were addressed  The following changes have been made: Stop Celexa  Labs/ tests ordered today include:   Orders Placed This Encounter  Procedures  . EXERCISE TOLERANCE TEST (ETT)  . EKG 12-Lead     Disposition:   FU with Carlyle Dolly, MD  Signed, Rosaria Ferries, PA-C  06/05/2020 7:30 PM    [place] Phone: [phone]; Fax: [phone]

## 2020-06-05 NOTE — Patient Instructions (Addendum)
Medication Instructions:   Continue all current medications.  Begin Diltiazem 30mg  - Use the Diltiazem as needed for high heart rate with Atrial Fib - if Heart rate high & no AFib - do not use it.   Labwork: none  Testing/Procedures:  Your physician has requested that you have an exercise tolerance test. For further information please visit [email or web address]. Please also follow instruction sheet, as given.  Office will contact with results via phone or letter.    Follow-Up: 3 months   Any Other Special Instructions Will Be Listed Below (If Applicable).  If you need a refill on your cardiac medications before your next appointment, please call your pharmacy.

## 2020-07-10 ENCOUNTER — Other Ambulatory Visit (HOSPITAL_COMMUNITY)
Admission: RE | Admit: 2020-07-10 | Discharge: 2020-07-10 | Disposition: A | Discharge status: Home / Self Care | Payer: Medicare Other | Source: Ambulatory Visit | Attending: Physician Assistant | Admitting: Physician Assistant

## 2020-07-10 ENCOUNTER — Other Ambulatory Visit: Payer: Self-pay

## 2020-07-10 DIAGNOSIS — Z20822 Contact with and (suspected) exposure to covid-19: Secondary | ICD-10-CM | POA: Insufficient documentation

## 2020-07-10 DIAGNOSIS — Z01812 Encounter for preprocedural laboratory examination: Secondary | ICD-10-CM | POA: Diagnosis not present

## 2020-07-11 LAB — SARS CORONAVIRUS 2 (TAT 6-24 HRS): SARS Coronavirus 2: NEGATIVE

## 2020-07-13 ENCOUNTER — Encounter (HOSPITAL_COMMUNITY): Payer: Medicare Other

## 2020-07-28 ENCOUNTER — Encounter: Payer: Self-pay | Admitting: Family Medicine

## 2020-07-28 ENCOUNTER — Other Ambulatory Visit: Payer: Self-pay

## 2020-07-28 ENCOUNTER — Ambulatory Visit: Payer: Medicare Other | Admitting: Family Medicine

## 2020-07-28 VITALS — BP 118/72 | Temp 98.0°F | Wt 105.6 lb

## 2020-07-28 DIAGNOSIS — R109 Unspecified abdominal pain: Secondary | ICD-10-CM | POA: Diagnosis not present

## 2020-07-28 DIAGNOSIS — M791 Myalgia, unspecified site: Secondary | ICD-10-CM | POA: Diagnosis not present

## 2020-07-28 DIAGNOSIS — R29898 Other symptoms and signs involving the musculoskeletal system: Secondary | ICD-10-CM | POA: Diagnosis not present

## 2020-07-28 DIAGNOSIS — F4321 Adjustment disorder with depressed mood: Secondary | ICD-10-CM

## 2020-07-28 NOTE — Progress Notes (Signed)
Subjective:    Patient ID: Pamela Yang, female    DOB: 09/17/1951, 69 y.o.   MRN: [id number]  HPI  Patient arrives to discuss stress and grief since the passing of her husband.  Patient states she started having pain in her back and weakness in her legs since the moment her husband died. Patient wonders if related to stress Patient scheduled for treadmill stress Thursday and don't think she can do the test and walk on treadmill Patient would also like something to help her sleep- but can not take ambien Review of Systems  Constitutional: Positive for fatigue. Negative for activity change and fever.  HENT: Negative for congestion and rhinorrhea.   Respiratory: Negative for cough, chest tightness and shortness of breath.   Cardiovascular: Negative for chest pain and leg swelling.  Gastrointestinal: Negative for abdominal pain and nausea.  Skin: Negative for color change.  Neurological: Negative for dizziness and headaches.  Psychiatric/Behavioral: Positive for dysphoric mood. Negative for agitation and behavioral problems.       Objective:   Physical Exam Vitals reviewed.  Constitutional:      General: She is not in acute distress. HENT:     Head: Normocephalic and atraumatic.  Eyes:     General:        Right eye: No discharge.        Left eye: No discharge.  Neck:     Trachea: No tracheal deviation.  Cardiovascular:     Rate and Rhythm: Normal rate and regular rhythm.     Heart sounds: Normal heart sounds. No murmur heard.   Pulmonary:     Effort: Pulmonary effort is normal. No respiratory distress.     Breath sounds: Normal breath sounds.  Lymphadenopathy:     Cervical: No cervical adenopathy.  Skin:    General: Skin is warm and dry.  Neurological:     Mental Status: She is alert.     Coordination: Coordination normal.  Psychiatric:        Behavior: Behavior normal.   Abdomen is soft negative straight leg raise        Assessment & Plan:  1.  Myalgia Lab work ordered await the results - C-reactive protein - Sedimentation rate - CBC with Differential/Platelet - Hepatic function panel - Basic metabolic panel - Lipase - CK (Creatine Kinase)  2. Weakness of both lower extremities It is possible some of this could be a grief reaction but I also believe it would be beneficial for the patient to have lab work to rule out other entities - C-reactive protein - Sedimentation rate - CBC with Differential/Platelet - Hepatic function panel - Basic metabolic panel - Lipase - CK (Creatine Kinase)  3. Abdominal pain, unspecified abdominal location Find no evidence of acute abdomen.  Await the results of lab work. - C-reactive protein - Sedimentation rate - CBC with Differential/Platelet - Hepatic function panel - Basic metabolic panel - Lipase - CK (Creatine Kinase)  4. Grief Patient sad but this is proportional to what is going on with the patient given the loss of her husband   It should be noted that the patient at times states that when she hits a bump in the road he jars her back and abdomen and causes pain which raises a concern that there could be something more going on but we will start off first with lab work before proceeding toward x-rays patient will follow up in 2 weeks time follow-up sooner problems May use anti-inflammatories  as needed hold off on antidepressant currently patient defers on any medication to help her sleep  30 minutes was spent with the patient including previsit chart review, time spent with patient, discussion of health issues, review of data including medical record, and documentation of the visit.

## 2020-07-29 LAB — BASIC METABOLIC PANEL
BUN/Creatinine Ratio: 22 (ref 12–28)
BUN: 18 mg/dL (ref 8–27)
CO2: 25 mmol/L (ref 20–29)
Calcium: 11.1 mg/dL — ABNORMAL HIGH (ref 8.7–10.3)
Chloride: 101 mmol/L (ref 96–106)
Creatinine, Ser: 0.83 mg/dL (ref 0.57–1.00)
GFR calc Af Amer: 82 mL/min/{1.73_m2} (ref >59)
GFR calc non Af Amer: 71 mL/min/{1.73_m2} (ref >59)
Glucose: 103 mg/dL — ABNORMAL HIGH (ref 65–99)
Potassium: 4.8 mmol/L (ref 3.5–5.2)
Sodium: 140 mmol/L (ref 134–144)

## 2020-07-29 LAB — CBC WITH DIFFERENTIAL/PLATELET
Basophils Absolute: 0.1 10*3/uL (ref 0.0–0.2)
Basos: 1 %
EOS (ABSOLUTE): 0.1 10*3/uL (ref 0.0–0.4)
Eos: 2 %
Hematocrit: 39.1 % (ref 34.0–46.6)
Hemoglobin: 13 g/dL (ref 11.1–15.9)
Immature Grans (Abs): 0 10*3/uL (ref 0.0–0.1)
Immature Granulocytes: 0 %
Lymphocytes Absolute: 1.2 10*3/uL (ref 0.7–3.1)
Lymphs: 19 %
MCH: 28.1 pg (ref 26.6–33.0)
MCHC: 33.2 g/dL (ref 31.5–35.7)
MCV: 84 fL (ref 79–97)
Monocytes Absolute: 0.5 10*3/uL (ref 0.1–0.9)
Monocytes: 8 %
Neutrophils Absolute: 4.5 10*3/uL (ref 1.4–7.0)
Neutrophils: 70 %
Platelets: 199 10*3/uL (ref 150–450)
RBC: 4.63 x10E6/uL (ref 3.77–5.28)
RDW: 13.4 % (ref 11.7–15.4)
WBC: 6.5 10*3/uL (ref 3.4–10.8)

## 2020-07-29 LAB — HEPATIC FUNCTION PANEL
ALT: 18 IU/L (ref 0–32)
AST: 23 IU/L (ref 0–40)
Albumin: 4.8 g/dL — ABNORMAL HIGH (ref 3.7–4.7)
Alkaline Phosphatase: 85 IU/L (ref 48–121)
Bilirubin Total: 0.4 mg/dL (ref 0.0–1.2)
Bilirubin, Direct: 0.12 mg/dL (ref 0.00–0.40)
Total Protein: 7.4 g/dL (ref 6.0–8.5)

## 2020-07-29 LAB — LIPASE: Lipase: 35 U/L (ref 14–85)

## 2020-07-29 LAB — SEDIMENTATION RATE: Sed Rate: 44 mm/hr — ABNORMAL HIGH (ref 0–40)

## 2020-07-29 LAB — C-REACTIVE PROTEIN: CRP: 5 mg/L (ref 0–10)

## 2020-07-29 LAB — CK: Total CK: 65 U/L (ref 32–182)

## 2020-07-30 ENCOUNTER — Other Ambulatory Visit: Payer: Self-pay | Admitting: Family Medicine

## 2020-07-30 ENCOUNTER — Telehealth: Payer: Self-pay | Admitting: Family Medicine

## 2020-07-30 ENCOUNTER — Encounter (HOSPITAL_COMMUNITY): Payer: Medicare Other

## 2020-07-30 ENCOUNTER — Encounter: Payer: Self-pay | Admitting: Family Medicine

## 2020-07-30 MED ORDER — HYDROCODONE-ACETAMINOPHEN 10-325 MG PO TABS: ORAL_TABLET | ORAL | 0 refills | Status: DC

## 2020-07-30 NOTE — Telephone Encounter (Signed)
Front This patient will be coming in at 8:45 AM to see me on Friday morning due to back leg pain pelvic pain

## 2020-07-30 NOTE — Telephone Encounter (Signed)
Patient was seen on  8/10 and now states the pain is still severe . She is requesting something for pain. Mabie

## 2020-07-31 ENCOUNTER — Telehealth: Payer: Self-pay | Admitting: Family Medicine

## 2020-07-31 ENCOUNTER — Ambulatory Visit (HOSPITAL_COMMUNITY)
Admission: RE | Admit: 2020-07-31 | Discharge: 2020-07-31 | Disposition: A | Discharge status: Home / Self Care | Payer: Medicare Other | Source: Ambulatory Visit | Attending: Family Medicine | Admitting: Family Medicine

## 2020-07-31 ENCOUNTER — Other Ambulatory Visit: Payer: Self-pay

## 2020-07-31 ENCOUNTER — Encounter: Payer: Self-pay | Admitting: Family Medicine

## 2020-07-31 ENCOUNTER — Other Ambulatory Visit (HOSPITAL_COMMUNITY): Payer: Self-pay | Admitting: Family Medicine

## 2020-07-31 ENCOUNTER — Other Ambulatory Visit: Payer: Self-pay | Admitting: *Deleted

## 2020-07-31 ENCOUNTER — Ambulatory Visit: Payer: Medicare Other | Admitting: Family Medicine

## 2020-07-31 VITALS — BP 126/74 | Temp 97.8°F | Wt 103.8 lb

## 2020-07-31 DIAGNOSIS — R102 Pelvic and perineal pain: Secondary | ICD-10-CM | POA: Insufficient documentation

## 2020-07-31 DIAGNOSIS — M545 Low back pain, unspecified: Secondary | ICD-10-CM

## 2020-07-31 DIAGNOSIS — R29898 Other symptoms and signs involving the musculoskeletal system: Secondary | ICD-10-CM

## 2020-07-31 DIAGNOSIS — M79604 Pain in right leg: Secondary | ICD-10-CM

## 2020-07-31 DIAGNOSIS — M79605 Pain in left leg: Secondary | ICD-10-CM

## 2020-07-31 NOTE — Telephone Encounter (Signed)
Nurses Needs urgent MRI lumbar spine This needs to be completed at the start of this week Patient was instructed to go to the ER should her symptoms get worse over the weekend Please see dictation from today's visit

## 2020-07-31 NOTE — Telephone Encounter (Signed)
Patient has follow up this morning

## 2020-07-31 NOTE — Progress Notes (Signed)
   Subjective:    Patient ID: Pamela Yang, female    DOB: 11/16/1951, 69 y.o.   MRN: [id number]  HPI Pt here for back and leg pain. Mostly pain in left leg but both legs are affected. Pt states her husband passed on 2020-08-04 and after that her pain has been unbearable. Pt states that she is also have coccyx pain.  Patient states this all happened when her husband died.  Patient relates when he died she bent over him t and she felt immediate pain in her lower back as well as pain down both legs.  Since then she is experience weakness since of both legs difficult time standing out of a chair walks halfway bent over.  Also feels like her bowels are having a hard time moving but she does states she has control of her bowel movement and bladder.  The pain is been so severe that she has had to take hydrocodone on a regular basis.  She has never had this before. No previous surgery. She does relate nocturnal pain.  It does wake her up at night. Review of Systems See above    Objective:   Physical Exam  Lungs clear heart regular Spine to percussion 10+ pain in the lumbar spine Positive straight leg raise bilateral Weakness in the hip flexors bilateral Difficulty getting out of a chair where normally that is no problem Walks bent over with cane.  Also some weakness in the quadriceps bilateral.     Assessment & Plan:  This is concerning for an acute lumbar spine issue It is possible this could be a herniated disc also possible this could be compression fracture Plain x-rays were completed today and does not show compression fracture Therefore proceed forward urgently with MRI of the spine.  In this particular case because of bilateral leg weakness as well as difficulty sensing her bowel movements this is not a situation for which 8 to 12 weeks of conservative care is indicated.  Because she is over 60 has this onset, and also having nocturnal pain, and also having some leg weakness as well as  difficulty sensing her bowel movements this puts this into more of the urgent category therefore we will pursue forward to getting this completed within the next few days if the patient gets worse over the weekend she is to go to the ER immediately

## 2020-07-31 NOTE — Telephone Encounter (Signed)
Brendale working on [employer] and will put in order after she figures out where pt can go for MRI

## 2020-08-03 ENCOUNTER — Other Ambulatory Visit: Payer: Self-pay | Admitting: *Deleted

## 2020-08-03 DIAGNOSIS — M79605 Pain in left leg: Secondary | ICD-10-CM

## 2020-08-03 DIAGNOSIS — M545 Low back pain: Secondary | ICD-10-CM

## 2020-08-03 DIAGNOSIS — M79604 Pain in right leg: Secondary | ICD-10-CM

## 2020-08-04 ENCOUNTER — Other Ambulatory Visit: Payer: Self-pay

## 2020-08-04 ENCOUNTER — Ambulatory Visit (HOSPITAL_COMMUNITY)
Admission: RE | Admit: 2020-08-04 | Discharge: 2020-08-04 | Disposition: A | Discharge status: Home / Self Care | Payer: Medicare Other | Source: Ambulatory Visit | Attending: Family Medicine | Admitting: Family Medicine

## 2020-08-04 ENCOUNTER — Telehealth: Payer: Self-pay | Admitting: *Deleted

## 2020-08-04 DIAGNOSIS — M545 Low back pain: Secondary | ICD-10-CM | POA: Insufficient documentation

## 2020-08-04 NOTE — Telephone Encounter (Signed)
Stat MRI results in epic for review

## 2020-08-04 NOTE — Telephone Encounter (Signed)
Patient is aware of her results she will follow-up tomorrow for recheck

## 2020-08-05 ENCOUNTER — Encounter: Payer: Self-pay | Admitting: Family Medicine

## 2020-08-05 ENCOUNTER — Ambulatory Visit: Payer: Medicare Other | Admitting: Family Medicine

## 2020-08-05 VITALS — BP 138/84 | Temp 97.4°F | Wt 102.6 lb

## 2020-08-05 DIAGNOSIS — R300 Dysuria: Secondary | ICD-10-CM | POA: Diagnosis not present

## 2020-08-05 DIAGNOSIS — R39198 Other difficulties with micturition: Secondary | ICD-10-CM

## 2020-08-05 DIAGNOSIS — M79604 Pain in right leg: Secondary | ICD-10-CM | POA: Diagnosis not present

## 2020-08-05 DIAGNOSIS — M545 Low back pain, unspecified: Secondary | ICD-10-CM

## 2020-08-05 DIAGNOSIS — R292 Abnormal reflex: Secondary | ICD-10-CM

## 2020-08-05 DIAGNOSIS — M79605 Pain in left leg: Secondary | ICD-10-CM

## 2020-08-05 LAB — POCT URINALYSIS DIPSTICK
Spec Grav, UA: 1.01 (ref 1.010–1.025)
pH, UA: 6 (ref 5.0–8.0)

## 2020-08-05 NOTE — Progress Notes (Signed)
   Subjective:    Patient ID: Pamela Yang, female    DOB: 1951/10/30, 69 y.o.   MRN: [id number]  HPI Pt here for follow up on lower back pain. Pt states this is the 3rd time in 8 days. Pt has had ultrasound and MRI. Pt has started having trouble getting her urine stream started and increased sensitivity in right shoulder area.pt states the lower back pain goes to bottom area and into genital area. Lying down is the only relief pt has.  No history her prior her right hand person review to stay like that right next Ongoing severe pain in the lower back hurts with certain movements hurts to rollover radiates pain into the left buttock feels a fullness in the vaginal region including some numbness at times and including some difficulty urinating.  Bowel movements somewhat constipated because of pain medication Review of Systems    Denies fever sweats chills vomiting denies abdominal pain Objective:   Physical Exam  Lungs clear respiratory rate normal heart is regular no murmurs abdomen is soft no guarding rebound or tenderness still has low back pain and discomfort radiating into the left hip also pain into both legs subjective negative straight leg raise brisk reflexes bilateral.  No clonus.  Strength good.      Assessment & Plan:  We will discuss case with neurosurgery I doubt that the patient has cauda equina syndrome But patient states she is having a difficult time urinating and also finds herself feeling like there is a pressure sensation in her rectum MRI is reassuring Patient still with severe low back pain with radiation into the left hip region hurts with certain movements Urinalysis not revealing Culture sent Abdominal exam purely normal Patient does have hyperreflexia of the legs as well as severe pain into both legs may well need to have MRI of thoracic spine to rule out compression of spinal cord

## 2020-08-06 ENCOUNTER — Telehealth: Payer: Self-pay | Admitting: Family Medicine

## 2020-08-06 ENCOUNTER — Other Ambulatory Visit: Payer: Self-pay

## 2020-08-06 ENCOUNTER — Ambulatory Visit (HOSPITAL_COMMUNITY)
Admission: RE | Admit: 2020-08-06 | Discharge: 2020-08-06 | Disposition: A | Discharge status: Home / Self Care | Payer: Medicare Other | Source: Ambulatory Visit | Attending: Family Medicine | Admitting: Family Medicine

## 2020-08-06 DIAGNOSIS — M79604 Pain in right leg: Secondary | ICD-10-CM | POA: Diagnosis not present

## 2020-08-06 DIAGNOSIS — M40204 Unspecified kyphosis, thoracic region: Secondary | ICD-10-CM | POA: Diagnosis not present

## 2020-08-06 DIAGNOSIS — R292 Abnormal reflex: Secondary | ICD-10-CM | POA: Insufficient documentation

## 2020-08-06 DIAGNOSIS — M8588 Other specified disorders of bone density and structure, other site: Secondary | ICD-10-CM | POA: Diagnosis not present

## 2020-08-06 DIAGNOSIS — M79605 Pain in left leg: Secondary | ICD-10-CM | POA: Diagnosis present

## 2020-08-06 DIAGNOSIS — M545 Low back pain: Secondary | ICD-10-CM | POA: Insufficient documentation

## 2020-08-06 DIAGNOSIS — R39198 Other difficulties with micturition: Secondary | ICD-10-CM | POA: Diagnosis present

## 2020-08-06 DIAGNOSIS — R918 Other nonspecific abnormal finding of lung field: Secondary | ICD-10-CM | POA: Diagnosis not present

## 2020-08-06 NOTE — Telephone Encounter (Signed)
Please let me know if you do not hear back so I can call again.

## 2020-08-06 NOTE — Telephone Encounter (Signed)
I have had conversations with neurosurgery thank you

## 2020-08-06 NOTE — Telephone Encounter (Signed)
Called [facility] in [place] [phone] and left a message with receptionist to have a neurosurgeon call dr scott's cell today about pt.

## 2020-08-06 NOTE — Telephone Encounter (Signed)
Nurses Let me speak with neurosurgery with specialist in [place] This is an outpatient I would like to speak with neurosurgery today.  They do not need to interrupt neurosurgeon from their surgery just have someone from that group speak with me on my cell number today regarding this patient  Situation patient having back pain, bilateral leg pain, sensation that she cannot empty her bladder well, hyperreflexia and negative appearing lumbar MRI

## 2020-08-06 NOTE — Telephone Encounter (Signed)
Per Dr.Scott after speaking with neurosurgeon. STAT Thoracic spine xray and thoracic spine MRI. MRI does not require PA(spoke with Brendale regarding this). Reasons: severe bilateral leg pain, mid to low back pain, difficultly urinating and hyperreflexia. MRI scheduled for 08/07/20 at 8 am. Arrive at 7:30 am. STAT xray also ordered. Contacted patient and gave her the information.

## 2020-08-07 ENCOUNTER — Ambulatory Visit (HOSPITAL_COMMUNITY)
Admission: RE | Admit: 2020-08-07 | Discharge: 2020-08-07 | Disposition: A | Discharge status: Home / Self Care | Payer: Medicare Other | Source: Ambulatory Visit | Attending: Family Medicine | Admitting: Family Medicine

## 2020-08-07 ENCOUNTER — Other Ambulatory Visit: Payer: Self-pay

## 2020-08-07 ENCOUNTER — Other Ambulatory Visit: Payer: Self-pay | Admitting: Family Medicine

## 2020-08-07 DIAGNOSIS — R292 Abnormal reflex: Secondary | ICD-10-CM | POA: Diagnosis not present

## 2020-08-07 DIAGNOSIS — M8588 Other specified disorders of bone density and structure, other site: Secondary | ICD-10-CM | POA: Diagnosis not present

## 2020-08-07 DIAGNOSIS — R918 Other nonspecific abnormal finding of lung field: Secondary | ICD-10-CM | POA: Diagnosis not present

## 2020-08-07 DIAGNOSIS — M40204 Unspecified kyphosis, thoracic region: Secondary | ICD-10-CM | POA: Diagnosis not present

## 2020-08-07 DIAGNOSIS — M545 Low back pain: Secondary | ICD-10-CM | POA: Diagnosis not present

## 2020-08-07 DIAGNOSIS — M79605 Pain in left leg: Secondary | ICD-10-CM | POA: Diagnosis not present

## 2020-08-07 DIAGNOSIS — G96198 Other disorders of meninges, not elsewhere classified: Secondary | ICD-10-CM

## 2020-08-07 DIAGNOSIS — M79604 Pain in right leg: Secondary | ICD-10-CM | POA: Diagnosis not present

## 2020-08-07 DIAGNOSIS — R39198 Other difficulties with micturition: Secondary | ICD-10-CM | POA: Diagnosis not present

## 2020-08-07 LAB — URINE CULTURE

## 2020-08-10 ENCOUNTER — Encounter: Payer: Self-pay | Admitting: Family Medicine

## 2020-08-11 ENCOUNTER — Telehealth: Payer: Self-pay | Admitting: *Deleted

## 2020-08-11 ENCOUNTER — Ambulatory Visit: Payer: Medicare Other | Admitting: Family Medicine

## 2020-08-11 NOTE — Telephone Encounter (Signed)
Pt had appt for today. She is seeing specialist on Thursday at 3pm. Pt states her pain is ok right now. And does not feel the need to keep appt today. Pt advised dr Nicki Reaper will call her on Friday to follow up on her appt with neurosurgery. Pt would like for dr scott to call her on this number  [phone]. This is her mobile number states her land line is messed up right now.

## 2020-08-13 DIAGNOSIS — Z681 Body mass index (BMI) 19 or less, adult: Secondary | ICD-10-CM | POA: Insufficient documentation

## 2020-08-13 DIAGNOSIS — R222 Localized swelling, mass and lump, trunk: Secondary | ICD-10-CM | POA: Insufficient documentation

## 2020-08-17 ENCOUNTER — Other Ambulatory Visit (HOSPITAL_COMMUNITY): Payer: Self-pay | Admitting: Student

## 2020-08-17 ENCOUNTER — Encounter: Payer: Self-pay | Admitting: Family Medicine

## 2020-08-17 ENCOUNTER — Other Ambulatory Visit: Payer: Self-pay | Admitting: Student

## 2020-08-17 ENCOUNTER — Other Ambulatory Visit: Payer: Self-pay | Admitting: Family Medicine

## 2020-08-17 DIAGNOSIS — R222 Localized swelling, mass and lump, trunk: Secondary | ICD-10-CM

## 2020-08-20 ENCOUNTER — Encounter (HOSPITAL_COMMUNITY): Payer: Self-pay

## 2020-08-20 ENCOUNTER — Ambulatory Visit (HOSPITAL_COMMUNITY): Payer: Medicare Other

## 2020-08-20 ENCOUNTER — Ambulatory Visit: Payer: Self-pay | Attending: Internal Medicine

## 2020-08-20 DIAGNOSIS — Z23 Encounter for immunization: Secondary | ICD-10-CM

## 2020-08-20 NOTE — Telephone Encounter (Signed)
Nurses I called [facility] and spoke with her regarding this.  She will be canceling the MRI.  She will follow up here on the 17th.  The front is aware.  Nurses-please connect with neurosurgery to have them fax [place] a copy of her most recent office visit-within the last 10 days thanks-Dr. Nicki Reaper

## 2020-08-20 NOTE — Telephone Encounter (Signed)
Patient was spoken with She will be not doing the MRI and will follow up with [place] on the 17th

## 2020-08-20 NOTE — Progress Notes (Signed)
   Covid-19 Vaccination Clinic  Name:  Pamela Yang    MRN: [id number] DOB: [birth year]  08/20/2020  [name] was observed post Covid-19 immunization for 15 minutes without incident. She was provided with Vaccine Information Sheet and instruction to access the V-Safe system.   [name] was instructed to call 911 with any severe reactions post vaccine: Marland Kitchen Difficulty breathing  . Swelling of face and throat  . A fast heartbeat  . A bad rash all over body  . Dizziness and weakness

## 2020-08-21 NOTE — Telephone Encounter (Signed)
Faxed a note to [facility] for most recent office visit.

## 2020-09-04 ENCOUNTER — Other Ambulatory Visit: Payer: Self-pay

## 2020-09-04 ENCOUNTER — Encounter: Payer: Self-pay | Admitting: Family Medicine

## 2020-09-04 ENCOUNTER — Ambulatory Visit: Payer: Medicare Other | Admitting: Family Medicine

## 2020-09-04 DIAGNOSIS — G96198 Other disorders of meninges, not elsewhere classified: Secondary | ICD-10-CM

## 2020-09-04 DIAGNOSIS — Z23 Encounter for immunization: Secondary | ICD-10-CM

## 2020-09-04 NOTE — Progress Notes (Signed)
   Subjective:    Patient ID: Pamela Yang, female    DOB: 20-Apr-1951, 69 y.o.   MRN: [id number]  Back Pain Pain location: lower back    Pt has been having lower back pain. Some weakness in leg but pt states pain is so much better. Pt did she specialist.  This patient was having severe back pain that occurred after the loss of her husband she underwent an MRI which showed arachnoid cyst but it also showed a nodular area near the spinal cord.  She saw neurosurgery that told her she needed to have an MRI with contrast to be able to figure out what this was.  Patient states she has been getting much better and did not want to undergo the test at that time.  She follows up today she states she does have to take hydrocodone intermittently sometimes once a day to help her with the pain the pain is more in the mid spine radiates to the buttock region but does not go down the leg she feels she is much improved compared to where she was  Review of Systems  Musculoskeletal: Positive for back pain.       Objective:   Physical Exam  Lungs clear heart regular low back not tender negative straight leg raise reflexes less brisk      Assessment & Plan:  1. Hypercalcemia Recheck metabolic 7 check parathyroid also check vitamin D await results - Basic Metabolic Panel (BMET) - PTH, Intact and Calcium - Vitamin D (25 hydroxy)  2. Spinal arachnoid cyst This area needs to be looked at closer The reason for this is because the previous MRI shows what could be intramedullary versus meningeal nodule It is impossible to tell if this is more worrisome or not I did discuss this with the specialist/neuroradiology and they stated that it is best to go ahead with MRI with contrast to look at this area closer The patient also saw neurosurgery who also told her that they need for her to do this with contrast The neuroradiologist stated that getting this repeated in 3 months would allow them to compare these  areas to see if it is changing.  I do not feel the patient needs to do it emergently now because her pain in her legs are doing better - MR THORACIC SPINE W CONTRAST  3. Need for vaccination Flu vaccine - Flu Vaccine QUAD High Dose(Fluad) Patient will do a regular follow-up visit in 3 months to discuss this MRI as well as to do her wellness

## 2020-09-04 NOTE — Progress Notes (Signed)
Cardiology Office Note   Date:  09/07/2020   ID:  Pamela Yang, DOB Jun 01, 1951, MRN [id number]  PCP:  Kathyrn Drown, MD  Cardiologist:  Dr. Harl Bowie     Chief Complaint  Patient presents with  . Atrial Fibrillation      History of Present Illness: Pamela Yang is a 69 y.o. female who presents for a fib   She has a history of GERD, GAD, gastroparesis, sinus brady, Afib dx 05/22/2020 on Eliquis, spont conversion to SR w/ 3.89 sec pause, Flecainide 50 mg bid and Dilt 30 mg prn. 06/16 notes regarding citalopram, cannot take w/ Flecainide>>d/c'd  Since she left the hospital, she has not had any more episodes.  The episode in March was almost exactly like the one in June, she feels it was the same thing, but it only lasted about 45", not caught on the monitor (which she wore later).   She has some orthostatic dizziness, but otherwise no presyncope or syncope. She tolerates the low HR very well.   She has not had any bleeding issues, the Xarelto was changed to Eliquis by the ER MD, she is tolerating it well.  She has a known history of bradycardia, she has had a long time.    She has social stressors she was the primary caregiver for her husband, who had dementia and died last month after 68 years of marriage.    She has been on the citalopram for a long time, but is okay with stopping it and then following up with her PCP.  After her husband's death she had a "meltdown" could not get out of bed or do much.  She can walk her dogs now.  But is not yet ready for ETT. for QRS widening on flecainide.  She believes she can do in 6 weeks.   No chest pain, no atrial fib and no SOB.     Past Medical History:  Diagnosis Date  . GAD (generalized anxiety disorder)   . Gastroparesis 5/08   Dr. Stann Mainland  . GERD (gastroesophageal reflux disease)   . Hearing loss   . Osteoporosis     Past Surgical History:  Procedure Laterality Date  . COLONOSCOPY  6/12   Dr Laural Golden      Current Outpatient Medications  Medication Sig Dispense Refill  . apixaban (ELIQUIS) 5 MG TABS tablet Take 1 tablet (5 mg total) by mouth 2 (two) times daily. 60 tablet 5  . Ascorbic Acid (VITAMIN C PO) Take 1 tablet by mouth daily.     Marland Kitchen denosumab (PROLIA) 60 MG/ML SOLN injection Inject 60 mg into the skin every 6 (six) months. Administer in upper arm, thigh, or abdomen    . diltiazem (CARDIZEM) 30 MG tablet Take 1 tablet (30 mg total) by mouth as needed (atrial fibrillation). 30 tablet 1  . Flaxseed, Linseed, (FLAXSEED OIL PO) Take 2 tablets by mouth daily.     . flecainide (TAMBOCOR) 50 MG tablet Take 1 tablet (50 mg total) by mouth 2 (two) times daily. 60 tablet 5  . HYDROcodone-acetaminophen (NORCO) 10-325 MG tablet TAKE ONE-HALF TO ONE TABLET EVERY FOUR HOURS AS NEEDED FOR PAIN MAY CAUSE DROWINESS 30 tablet 0  . IRON PO Take 1 tablet by mouth daily.     . Multiple Vitamins-Minerals (MULTIVITAMIN WITH MINERALS) tablet Take 1 tablet by mouth daily.    Marland Kitchen Propylene Glycol (SYSTANE BALANCE) 0.6 % SOLN Apply 2 drops to eye in the morning and at bedtime.  Into both eyes     No current facility-administered medications for this visit.    Allergies:   Patient has no known allergies.    Social History:  The patient  reports that she quit smoking about 40 years ago. She has never used smokeless tobacco. She reports previous alcohol use. She reports that she does not use drugs.   Family History:  The patient's family history includes Heart attack in her mother; Ovarian cancer in her sister.    ROS:  General:no colds or fevers, has lost 4 lbs with husband's death in thin pt.  Skin:no rashes or ulcers HEENT:no blurred vision, no congestion CV:see HPI PUL:see HPI GI:no diarrhea constipation or melena, no indigestion GU:no hematuria, no dysuria MS:no joint pain, no claudication Neuro:no syncope, no lightheadedness Endo:no diabetes, no thyroid disease  Wt Readings from Last 3  Encounters:  09/07/20 104 lb (47.2 kg)  09/04/20 103 lb 3.2 oz (46.8 kg)  08/05/20 102 lb 9.6 oz (46.5 kg)     PHYSICAL EXAM: VS:  BP 130/72   Pulse 60   Ht 5\' 4"  (1.626 m)   Wt 104 lb (47.2 kg)   BMI 17.85 kg/m  , BMI Body mass index is 17.85 kg/m. General:Pleasant affect, NAD Skin:Warm and dry, brisk capillary refill HEENT:normocephalic, sclera clear, mucus membranes moist Neck:supple, no JVD, no bruits  Heart:S1S2 RRR without murmur, gallup, rub or click Lungs:clear without rales, rhonchi, or wheezes FUX:NATF, non tender, + BS, do not palpate liver spleen or masses Ext:no lower ext edema, 2+ pedal pulses, 2+ radial pulses Neuro:alert and oriented X 3, MAE, follows commands, + facial symmetry    EKG:  EKG is NOT ordered today.   Recent Labs: 07/28/2020: ALT 18; Hemoglobin 13.0; Platelets 199 09/04/2020: BUN 20; Creatinine, Ser 0.73; Potassium 5.0; Sodium 143    Lipid Panel    Component Value Date/Time   CHOL 207 (H) 09/09/2019 0808   TRIG 88 09/09/2019 0808   HDL 81 09/09/2019 0808   CHOLHDL 2.6 09/09/2019 0808   CHOLHDL 2.9 12/23/2014 0840   VLDL 12 12/23/2014 0840   LDLCALC 111 (H) 09/09/2019 0808       Other studies Reviewed: Additional studies/ records that were reviewed today include:  14 day event monitor .  14 day event monitor  Min HR 41, Max HR 113, Avg HR 56. Min HR occurred in early AM hours presumably while sleeping  Reported symptoms correlated with mild sinus bradycardia high 50s  No significant arrhythmias. Mildly decreased average heart rate.     ASSESSMENT AND PLAN:  1.  PAF on flecainide and eliquis - no bleeding.  HR today 60 and regular on exam.  No a fib since last visit, has only had 2 episodes.  Will proceed with ETT in 6 weeks.  Continue eliquis and follow up in 12-19-2023.  Grief with recent death of her husband. And severe back pain with this, has been unable to walk on TM. Also with hypercalcemia.      Current medicines are  reviewed with the patient today.  The patient Has no concerns regarding medicines.  The following changes have been made:  See above Labs/ tests ordered today include:see above  Disposition:   FU:  see above  Signed, Cecilie Kicks, NP  09/07/2020 3:12 PM    [address], [place], [place], [place] Phone: [phone]; Fax: [phone]

## 2020-09-05 LAB — BASIC METABOLIC PANEL
BUN/Creatinine Ratio: 27 (ref 12–28)
BUN: 20 mg/dL (ref 8–27)
CO2: 27 mmol/L (ref 20–29)
Calcium: 10.3 mg/dL (ref 8.7–10.3)
Chloride: 101 mmol/L (ref 96–106)
Creatinine, Ser: 0.73 mg/dL (ref 0.57–1.00)
GFR calc Af Amer: 96 mL/min/{1.73_m2} (ref >59)
GFR calc non Af Amer: 83 mL/min/{1.73_m2} (ref >59)
Glucose: 88 mg/dL (ref 65–99)
Potassium: 5 mmol/L (ref 3.5–5.2)
Sodium: 143 mmol/L (ref 134–144)

## 2020-09-05 LAB — PTH, INTACT AND CALCIUM: PTH: 21 pg/mL (ref 15–65)

## 2020-09-05 LAB — VITAMIN D 25 HYDROXY (VIT D DEFICIENCY, FRACTURES): Vit D, 25-Hydroxy: 59.1 ng/mL (ref 30.0–100.0)

## 2020-09-07 ENCOUNTER — Other Ambulatory Visit: Payer: Self-pay | Admitting: Family Medicine

## 2020-09-07 ENCOUNTER — Ambulatory Visit: Payer: Medicare Other | Admitting: Physician Assistant

## 2020-09-07 ENCOUNTER — Other Ambulatory Visit: Payer: Self-pay

## 2020-09-07 ENCOUNTER — Ambulatory Visit: Payer: Medicare Other | Admitting: Cardiology

## 2020-09-07 ENCOUNTER — Encounter: Payer: Self-pay | Admitting: Cardiology

## 2020-09-07 VITALS — BP 130/72 | HR 60 | Ht 64.0 in | Wt 104.0 lb

## 2020-09-07 DIAGNOSIS — Z5181 Encounter for therapeutic drug level monitoring: Secondary | ICD-10-CM

## 2020-09-07 DIAGNOSIS — I480 Paroxysmal atrial fibrillation: Secondary | ICD-10-CM | POA: Diagnosis not present

## 2020-09-07 DIAGNOSIS — Z79899 Other long term (current) drug therapy: Secondary | ICD-10-CM | POA: Diagnosis not present

## 2020-09-07 NOTE — Patient Instructions (Signed)
Medication Instructions:  Your physician recommends that you continue on your current medications as directed. Please refer to the Current Medication list given to you today.  *If you need a refill on your cardiac medications before your next appointment, please call your pharmacy*   Lab Work: NONE   If you have labs (blood work) drawn today and your tests are completely normal, you will receive your results only by: Marland Kitchen MyChart Message (if you have MyChart) OR . A paper copy in the mail If you have any lab test that is abnormal or we need to change your treatment, we will call you to review the results.   Testing/Procedures: Your physician has requested that you have an exercise tolerance test. For further information please visit [email or web address]. Please also follow instruction sheet, as given.     Follow-Up: At [facility], you and your health needs are our priority.  As part of our continuing mission to provide you with exceptional heart care, we have created designated Provider Care Teams.  These Care Teams include your primary Cardiologist (physician) and Advanced Practice Providers (APPs -  Physician Assistants and Nurse Practitioners) who all work together to provide you with the care you need, when you need it.  We recommend signing up for the patient portal called "MyChart".  Sign up information is provided on this After Visit Summary.  MyChart is used to connect with patients for Virtual Visits (Telemedicine).  Patients are able to view lab/test results, encounter notes, upcoming appointments, etc.  Non-urgent messages can be sent to your provider as well.   To learn more about what you can do with MyChart, go to [email or web address].    Your next appointment:   3 month(s)  The format for your next appointment:   In Person  Provider:   You may see Carlyle Dolly, MD or one of the following Advanced Practice Providers on your designated Care Team:    Bernerd Pho, PA-C   Ermalinda Barrios, [place]     Other Instructions Thank you for choosing [facility]!

## 2020-10-25 ENCOUNTER — Encounter: Payer: Self-pay | Admitting: Family Medicine

## 2020-10-26 ENCOUNTER — Other Ambulatory Visit: Payer: Self-pay | Admitting: *Deleted

## 2020-10-26 DIAGNOSIS — G96198 Other disorders of meninges, not elsewhere classified: Secondary | ICD-10-CM

## 2020-10-26 NOTE — Telephone Encounter (Signed)
Nurses Please proceed forward with setting up MRI As for see neurosurgery please also have referral set her up for a follow-up with neurosurgery.  The patient specifically wants to see the doctor and not the PA thank you

## 2020-10-26 NOTE — Telephone Encounter (Signed)
Mri order was put in on 9/17 and I will send courtney beasley a message to check on. I do not see where referral was put in. Do you want to do referral or wait for mri results.

## 2020-10-30 ENCOUNTER — Other Ambulatory Visit: Payer: Self-pay

## 2020-10-30 ENCOUNTER — Other Ambulatory Visit: Payer: Self-pay | Admitting: *Deleted

## 2020-10-30 ENCOUNTER — Other Ambulatory Visit (HOSPITAL_COMMUNITY)
Admission: RE | Admit: 2020-10-30 | Discharge: 2020-10-30 | Disposition: A | Discharge status: Home / Self Care | Payer: Medicare Other | Source: Ambulatory Visit | Attending: Family Medicine | Admitting: Family Medicine

## 2020-10-30 DIAGNOSIS — Z20822 Contact with and (suspected) exposure to covid-19: Secondary | ICD-10-CM | POA: Insufficient documentation

## 2020-10-30 DIAGNOSIS — Z01812 Encounter for preprocedural laboratory examination: Secondary | ICD-10-CM | POA: Insufficient documentation

## 2020-10-30 DIAGNOSIS — G96198 Other disorders of meninges, not elsewhere classified: Secondary | ICD-10-CM

## 2020-10-30 LAB — SARS CORONAVIRUS 2 (TAT 6-24 HRS): SARS Coronavirus 2: NEGATIVE

## 2020-10-30 NOTE — Telephone Encounter (Signed)
Order changed to with and without contrast. Thank you

## 2020-10-30 NOTE — Telephone Encounter (Signed)
Kathyrn Drown, MD  Carmelina Noun, LPN Please schedule this with and without contrast as stated below thank you

## 2020-11-02 ENCOUNTER — Other Ambulatory Visit: Payer: Self-pay

## 2020-11-02 ENCOUNTER — Ambulatory Visit (HOSPITAL_COMMUNITY)
Admission: RE | Admit: 2020-11-02 | Discharge: 2020-11-02 | Disposition: A | Discharge status: Home / Self Care | Payer: Medicare Other | Source: Ambulatory Visit | Attending: Cardiology | Admitting: Cardiology

## 2020-11-02 DIAGNOSIS — Z5181 Encounter for therapeutic drug level monitoring: Secondary | ICD-10-CM

## 2020-11-02 DIAGNOSIS — Z79899 Other long term (current) drug therapy: Secondary | ICD-10-CM | POA: Diagnosis not present

## 2020-11-02 DIAGNOSIS — I10 Essential (primary) hypertension: Secondary | ICD-10-CM | POA: Diagnosis not present

## 2020-11-02 DIAGNOSIS — Z7182 Exercise counseling: Secondary | ICD-10-CM | POA: Insufficient documentation

## 2020-11-02 LAB — EXERCISE TOLERANCE TEST
Estimated workload: 9.2 METS
Exercise duration (min): 6 min
Exercise duration (sec): 43 s
MPHR: 149 {beats}/min
Peak HR: 120 {beats}/min
Percent HR: 80 %
RPE: 15
Rest HR: 51 {beats}/min

## 2020-11-09 ENCOUNTER — Encounter: Payer: Self-pay | Admitting: Family Medicine

## 2020-11-09 DIAGNOSIS — E785 Hyperlipidemia, unspecified: Secondary | ICD-10-CM

## 2020-11-09 DIAGNOSIS — Z79899 Other long term (current) drug therapy: Secondary | ICD-10-CM

## 2020-11-16 ENCOUNTER — Other Ambulatory Visit: Payer: Self-pay

## 2020-11-16 ENCOUNTER — Ambulatory Visit (HOSPITAL_COMMUNITY)
Admission: RE | Admit: 2020-11-16 | Discharge: 2020-11-16 | Disposition: A | Discharge status: Home / Self Care | Payer: Medicare Other | Source: Ambulatory Visit | Attending: Family Medicine | Admitting: Family Medicine

## 2020-11-16 DIAGNOSIS — R937 Abnormal findings on diagnostic imaging of other parts of musculoskeletal system: Secondary | ICD-10-CM | POA: Diagnosis not present

## 2020-11-16 DIAGNOSIS — Z79899 Other long term (current) drug therapy: Secondary | ICD-10-CM | POA: Diagnosis not present

## 2020-11-16 DIAGNOSIS — D1809 Hemangioma of other sites: Secondary | ICD-10-CM | POA: Diagnosis not present

## 2020-11-16 DIAGNOSIS — E785 Hyperlipidemia, unspecified: Secondary | ICD-10-CM | POA: Diagnosis not present

## 2020-11-16 DIAGNOSIS — G96198 Other disorders of meninges, not elsewhere classified: Secondary | ICD-10-CM | POA: Insufficient documentation

## 2020-11-16 MED ORDER — GADOBUTROL 1 MMOL/ML IV SOLN
7.0000 mL | Freq: Once | INTRAVENOUS | Status: AC | PRN
  Administered 2020-11-16: 7 mL via INTRAVENOUS

## 2020-11-17 DIAGNOSIS — M810 Age-related osteoporosis without current pathological fracture: Secondary | ICD-10-CM | POA: Diagnosis not present

## 2020-11-17 LAB — HEPATIC FUNCTION PANEL
ALT: 33 IU/L — ABNORMAL HIGH (ref 0–32)
AST: 30 IU/L (ref 0–40)
Albumin: 4.6 g/dL (ref 3.7–4.7)
Alkaline Phosphatase: 83 IU/L (ref 44–121)
Bilirubin Total: 0.8 mg/dL (ref 0.0–1.2)
Bilirubin, Direct: 0.16 mg/dL (ref 0.00–0.40)
Total Protein: 7 g/dL (ref 6.0–8.5)

## 2020-11-17 LAB — BASIC METABOLIC PANEL
BUN/Creatinine Ratio: 21 (ref 12–28)
BUN: 15 mg/dL (ref 8–27)
CO2: 23 mmol/L (ref 20–29)
Calcium: 9.7 mg/dL (ref 8.7–10.3)
Chloride: 104 mmol/L (ref 96–106)
Creatinine, Ser: 0.73 mg/dL (ref 0.57–1.00)
GFR calc Af Amer: 96 mL/min/{1.73_m2} (ref >59)
GFR calc non Af Amer: 83 mL/min/{1.73_m2} (ref >59)
Glucose: 103 mg/dL — ABNORMAL HIGH (ref 65–99)
Potassium: 4 mmol/L (ref 3.5–5.2)
Sodium: 141 mmol/L (ref 134–144)

## 2020-11-17 LAB — PTH, INTACT AND CALCIUM: PTH: 32 pg/mL (ref 15–65)

## 2020-11-24 ENCOUNTER — Ambulatory Visit (INDEPENDENT_AMBULATORY_CARE_PROVIDER_SITE_OTHER): Payer: Medicare Other | Admitting: Family Medicine

## 2020-11-24 ENCOUNTER — Encounter: Payer: Self-pay | Admitting: Family Medicine

## 2020-11-24 ENCOUNTER — Other Ambulatory Visit: Payer: Self-pay

## 2020-11-24 VITALS — BP 136/80 | Temp 98.1°F | Wt 105.6 lb

## 2020-11-24 DIAGNOSIS — Z0000 Encounter for general adult medical examination without abnormal findings: Secondary | ICD-10-CM

## 2020-11-24 NOTE — Progress Notes (Signed)
Patient ID: Pamela Yang, female    DOB: 04/04/1951, 69 y.o.   MRN: [id number]   Chief Complaint  Patient presents with  . Annual Exam   Subjective:  CC: annual exam Presents today for her annual Medicare wellness exam.  Unfortunately she lost her husband in August, still struggling with this loss.  She reports that she has adequate support system walks 1-2 times a day and the grief counselor with hospice checks on her at least once a month.  Her PHQ score today was 8 she reports that all of her issues are related to the loss of her husband.  She reports that after her husband died, she started having some issues with her back, had an MRI in November which revealed no back problems, her pain has resolved.  Her last Pap smear was August 30, 2017 which was normal, according to guidelines she no longer needs to have a Pap smear.  Her last mammogram was February 2021 and that was normal.   AWV- Annual Wellness Visit  The patient was seen for their annual wellness visit. The patient's past medical history, surgical history, and family history were reviewed. Pertinent vaccines were reviewed ( tetanus, pneumonia, shingles, flu) The patient's medication list was reviewed and updated.  The height and weight were entered.  BMI recorded in electronic record elsewhere  Cognitive screening was completed. Outcome of Mini - Cog: PASS   Falls /depression screening electronically recorded within record elsewhere  Current tobacco usage: none (All patients who use tobacco were given written and verbal information on quitting)  Recent listing of emergency department/hospitalizations over the past year were reviewed.  current specialist the patient sees on a regular basis: Cardio   Medicare annual wellness visit patient questionnaire was reviewed.  A written screening schedule for the patient for the next 5-10 years was given. Appropriate discussion of followup regarding next visit was  discussed.      Medical History Javonne has a past medical history of GAD (generalized anxiety disorder), Gastroparesis (5/08), GERD (gastroesophageal reflux disease), Hearing loss, and Osteoporosis.   Outpatient Encounter Medications as of 11/24/2020  Medication Sig  . apixaban (ELIQUIS) 5 MG TABS tablet Take 1 tablet (5 mg total) by mouth 2 (two) times daily.  . Ascorbic Acid (VITAMIN C PO) Take 1 tablet by mouth daily.   Marland Kitchen denosumab (PROLIA) 60 MG/ML SOLN injection Inject 60 mg into the skin every 6 (six) months. Administer in upper arm, thigh, or abdomen  . diltiazem (CARDIZEM) 30 MG tablet Take 1 tablet (30 mg total) by mouth as needed (atrial fibrillation).  . Flaxseed, Linseed, (FLAXSEED OIL PO) Take 2 tablets by mouth daily.   . flecainide (TAMBOCOR) 50 MG tablet Take 1 tablet (50 mg total) by mouth 2 (two) times daily.  . IRON PO Take 1 tablet by mouth daily.   . Multiple Vitamins-Minerals (MULTIVITAMIN WITH MINERALS) tablet Take 1 tablet by mouth daily.  Marland Kitchen Propylene Glycol (SYSTANE BALANCE) 0.6 % SOLN Apply 2 drops to eye in the morning and at bedtime. Into both eyes  . [DISCONTINUED] HYDROcodone-acetaminophen ([place]) 10-325 MG tablet TAKE ONE-HALF TO ONE TABLET EVERY FOUR HOURS AS NEEDED FOR PAIN MAY CAUSE DROWINESS   No facility-administered encounter medications on file as of 11/24/2020.     Review of Systems  Constitutional: Negative for chills and fever.  HENT: Negative for ear pain.   Respiratory: Negative for shortness of breath.   Cardiovascular: Negative for chest pain and leg swelling.  Gastrointestinal: Negative for abdominal pain.  Genitourinary: Negative for dysuria.  Musculoskeletal: Negative for back pain, gait problem and joint swelling.     Vitals BP 136/80   Temp 98.1 F (36.7 C)   Wt 105 lb 9.6 oz (47.9 kg)   BMI 18.13 kg/m   Objective:   Physical Exam Vitals and nursing note reviewed.  Constitutional:      General: She is not in acute  distress.    Appearance: Normal appearance. She is normal weight.  HENT:     Head: Normocephalic.     Right Ear: Tympanic membrane normal.     Left Ear: Tympanic membrane normal.     Nose: Nose normal.     Mouth/Throat:     Mouth: Mucous membranes are moist.     Pharynx: Oropharynx is clear.  Eyes:     Extraocular Movements: Extraocular movements intact.     Pupils: Pupils are equal, round, and reactive to light.  Cardiovascular:     Rate and Rhythm: Normal rate and regular rhythm.     Heart sounds: Normal heart sounds.     Comments: History of A. fib, regular rate today. Pulmonary:     Effort: Pulmonary effort is normal.     Breath sounds: Normal breath sounds.  Abdominal:     General: Abdomen is flat. Bowel sounds are normal.     Tenderness: There is no abdominal tenderness. There is no guarding.  Musculoskeletal:        General: No swelling or tenderness. Normal range of motion.  Skin:    General: Skin is warm and dry.     Capillary Refill: Capillary refill takes less than 2 seconds.  Neurological:     General: No focal deficit present.     Mental Status: She is alert and oriented to person, place, and time.     Cranial Nerves: No cranial nerve deficit.     Sensory: No sensory deficit.     Motor: No weakness.     Coordination: Coordination normal.     Gait: Gait normal.  Psychiatric:        Behavior: Behavior normal.        Thought Content: Thought content normal.        Judgment: Judgment normal.     Comments: Dealing with the loss of her husband.      Assessment and Plan   1. Encounter for annual wellness visit (AWV) in Medicare patient   Health maintenance: Reviewed recent labs in detail.  Encouraged continued lifestyle modification such as walking, and getting fresh air in the sunshine.  Sees cardiology for A. fib management, compliant with medications. Sees endocrinology for osteoporosis, on Prolia, believes she will be getting her last dose soon, has been  on this for 6 years.  Takes vitamin C, ferrous sulfate, flaxseed, multivitamins OTC.  No medications to be refilled today by this practice, all medications are managed by specialist.  Last Pap smear August 30, 2017 normal.  Her last mammogram February 2021 normal.  Gets an annual eye exam.  And sees a dentist twice per year.  Agrees with plan of care discussed today. Understands warning signs to seek further care: Chest pain, shortness of breath, any significant change in health. Understands to follow-up in 1 year for annual wellness, sooner if needed.  Pecolia Ades, FNP-C

## 2020-11-24 NOTE — Patient Instructions (Signed)
Preventive Care 38 Years and Older, Female Preventive care refers to lifestyle choices and visits with your health care provider that can promote health and wellness. This includes:  A yearly physical exam. This is also called an annual well check.  Regular dental and eye exams.  Immunizations.  Screening for certain conditions.  Healthy lifestyle choices, such as diet and exercise. What can I expect for my preventive care visit? Physical exam Your health care provider will check:  Height and weight. These may be used to calculate body mass index (BMI), which is a measurement that tells if you are at a healthy weight.  Heart rate and blood pressure.  Your skin for abnormal spots. Counseling Your health care provider may ask you questions about:  Alcohol, tobacco, and drug use.  Emotional well-being.  Home and relationship well-being.  Sexual activity.  Eating habits.  History of falls.  Memory and ability to understand (cognition).  Work and work Statistician.  Pregnancy and menstrual history. What immunizations do I need?  Influenza (flu) vaccine  This is recommended every year. Tetanus, diphtheria, and pertussis (Tdap) vaccine  You may need a Td booster every 10 years. Varicella (chickenpox) vaccine  You may need this vaccine if you have not already been vaccinated. Zoster (shingles) vaccine  You may need this after age 33. Pneumococcal conjugate (PCV13) vaccine  One dose is recommended after age 33. Pneumococcal polysaccharide (PPSV23) vaccine  One dose is recommended after age 72. Measles, mumps, and rubella (MMR) vaccine  You may need at least one dose of MMR if you were born in 1957 or later. You may also need a second dose. Meningococcal conjugate (MenACWY) vaccine  You may need this if you have certain conditions. Hepatitis A vaccine  You may need this if you have certain conditions or if you travel or work in places where you may be exposed  to hepatitis A. Hepatitis B vaccine  You may need this if you have certain conditions or if you travel or work in places where you may be exposed to hepatitis B. Haemophilus influenzae type b (Hib) vaccine  You may need this if you have certain conditions. You may receive vaccines as individual doses or as more than one vaccine together in one shot (combination vaccines). Talk with your health care provider about the risks and benefits of combination vaccines. What tests do I need? Blood tests  Lipid and cholesterol levels. These may be checked every 5 years, or more frequently depending on your overall health.  Hepatitis C test.  Hepatitis B test. Screening  Lung cancer screening. You may have this screening every year starting at age 39 if you have a 30-pack-year history of smoking and currently smoke or have quit within the past 15 years.  Colorectal cancer screening. All adults should have this screening starting at age 36 and continuing until age 15. Your health care provider may recommend screening at age 23 if you are at increased risk. You will have tests every 1-10 years, depending on your results and the type of screening test.  Diabetes screening. This is done by checking your blood sugar (glucose) after you have not eaten for a while (fasting). You may have this done every 1-3 years.  Mammogram. This may be done every 1-2 years. Talk with your health care provider about how often you should have regular mammograms.  BRCA-related cancer screening. This may be done if you have a family history of breast, ovarian, tubal, or peritoneal cancers.  Other tests  Sexually transmitted disease (STD) testing.  Bone density scan. This is done to screen for osteoporosis. You may have this done starting at age 44. Follow these instructions at home: Eating and drinking  Eat a diet that includes fresh fruits and vegetables, whole grains, lean protein, and low-fat dairy products. Limit  your intake of foods with high amounts of sugar, saturated fats, and salt.  Take vitamin and mineral supplements as recommended by your health care provider.  Do not drink alcohol if your health care provider tells you not to drink.  If you drink alcohol: ? Limit how much you have to 0-1 drink a day. ? Be aware of how much alcohol is in your drink. In the [place] one drink equals one 12 oz bottle of beer (355 mL), one 5 oz glass of wine (148 mL), or one 1 oz glass of hard liquor (44 mL). Lifestyle  Take daily care of your teeth and gums.  Stay active. Exercise for at least 30 minutes on 5 or more days each week.  Do not use any products that contain nicotine or tobacco, such as cigarettes, e-cigarettes, and chewing tobacco. If you need help quitting, ask your health care provider.  If you are sexually active, practice safe sex. Use a condom or other form of protection in order to prevent STIs (sexually transmitted infections).  Talk with your health care provider about taking a low-dose aspirin or statin. What's next?  Go to your health care provider once a year for a well check visit.  Ask your health care provider how often you should have your eyes and teeth checked.  Stay up to date on all vaccines. This information is not intended to replace advice given to you by your health care provider. Make sure you discuss any questions you have with your health care provider. Document Revised: 11/29/2018 Document Reviewed: 11/29/2018 Elsevier Patient Education  2020 [employer].

## 2020-11-25 LAB — LIPID PANEL W/O CHOL/HDL RATIO
Cholesterol, Total: 207 mg/dL — ABNORMAL HIGH (ref 100–199)
HDL: 69 mg/dL (ref >39)
LDL Chol Calc (NIH): 120 mg/dL — ABNORMAL HIGH (ref 0–99)
Triglycerides: 104 mg/dL (ref 0–149)
VLDL Cholesterol Cal: 18 mg/dL (ref 5–40)

## 2020-11-25 LAB — SPECIMEN STATUS REPORT

## 2020-11-28 ENCOUNTER — Other Ambulatory Visit: Payer: Self-pay | Admitting: Cardiology

## 2020-12-03 ENCOUNTER — Encounter: Payer: Self-pay | Admitting: Family Medicine

## 2020-12-04 NOTE — Telephone Encounter (Signed)
Nurses Please connect with Genean I reviewed over my schedule. I believe it would be a good idea to do a visit. I can do a virtual visit or a in person visit on 23 December. Please go ahead and have the front schedule thank you

## 2020-12-09 ENCOUNTER — Ambulatory Visit: Payer: Medicare Other | Admitting: Student

## 2020-12-09 ENCOUNTER — Encounter: Payer: Self-pay | Admitting: Student

## 2020-12-09 ENCOUNTER — Other Ambulatory Visit: Payer: Self-pay

## 2020-12-09 VITALS — BP 122/62 | HR 60 | Ht 64.0 in | Wt 107.0 lb

## 2020-12-09 DIAGNOSIS — I480 Paroxysmal atrial fibrillation: Secondary | ICD-10-CM

## 2020-12-09 MED ORDER — RIVAROXABAN 20 MG PO TABS: 20.0000 mg | ORAL_TABLET | Freq: Every day | ORAL | 11 refills | Status: DC

## 2020-12-09 NOTE — Progress Notes (Signed)
Cardiology Office Note    Date:  12/09/2020   ID:  Pamela Yang 1951/07/31, MRN [id number]  PCP:  Pamela Drown, MD  Cardiologist: Pamela Dolly, MD    Chief Complaint  Patient presents with  . Follow-up    3 month visit    History of Present Illness:    Pamela Yang is a 69 y.o. female with past medical history of paroxysmal atrial fibrillation (on Flecainide) and Osteoporosis who presents to the office today for 3 month follow-up.   She was last examined by Pamela Kicks, NP in 08/2020 and reported being under increased stress at that time as her husband had recently passed away. She denied any chest pain or palpitations at that time. It was recommended she have an ETT to assess for QRS widening given she was on Flecainide but she wished to postpone this for 6 weeks. ETT was performed on 11/02/2020 and showed no significant arrhythmias or QRS changes. She did have a hypertensive response to exercise.  In talking with the patient today, she reports overall doing well from a cardiac perspective since her last office visit.  She does report episodes of her heart "pounding hard" but feels like this typically occurs when she is stressed or anxious. She denies any irregular palpitations or tachy-palpitations resembling her prior atrial fibrillation. She does have a Fitbit and reports her heart rate is typically in the 40's to 50's when checked at rest. She does walk her dogs on a daily basis and denies any chest pain or dyspnea on exertion with this. No recent orthopnea, PND or lower extremity edema. She is planning to start [facility] back [facility] the [facility] next year.   Past Medical History:  Diagnosis Date  . Atrial fibrillation ([place])   . GAD (generalized anxiety disorder)   . Gastroparesis 5/08   Dr. Stann Yang  . GERD (gastroesophageal reflux disease)   . Hearing loss   . Osteoporosis     Past Surgical History:  Procedure Laterality Date  . COLONOSCOPY  6/12   Dr Pamela Yang     Current Medications: Outpatient Medications Prior to Visit  Medication Sig Dispense Refill  . Ascorbic Acid (VITAMIN C PO) Take 1 tablet by mouth daily.     Marland Kitchen denosumab (PROLIA) 60 MG/ML SOLN injection Inject 60 mg into the skin every 6 (six) months. Administer in upper arm, thigh, or abdomen    . diltiazem (CARDIZEM) 30 MG tablet Take 1 tablet (30 mg total) by mouth as needed (atrial fibrillation). 30 tablet 1  . Flaxseed, Linseed, (FLAXSEED OIL PO) Take 2 tablets by mouth daily.     . flecainide (TAMBOCOR) 50 MG tablet TAKE ONE TABLET BY MOUTH TWICE A DAY 60 tablet 5  . IRON PO Take 1 tablet by mouth daily.     . Multiple Vitamins-Minerals (MULTIVITAMIN WITH MINERALS) tablet Take 1 tablet by mouth daily.    Marland Kitchen Propylene Glycol (SYSTANE BALANCE) 0.6 % SOLN Apply 2 drops to eye in the morning and at bedtime. Into both eyes    . ELIQUIS 5 MG TABS tablet TAKE ONE TABLET BY MOUTH TWICE A DAY 60 tablet 5   No facility-administered medications prior to visit.     Allergies:   Patient has no known allergies.   Social History   Socioeconomic History  . Marital status: Widowed    Spouse name: Not on file  . Number of children: Not on file  . Years of education: Not on file  .  Highest education level: Not on file  Occupational History  . Not on file  Tobacco Use  . Smoking status: Former Smoker    Quit date: 12/20/1979    Years since quitting: 41.0  . Smokeless tobacco: Never Used  Vaping Use  . Vaping Use: Never used  Substance and Sexual Activity  . Alcohol use: Not Currently  . Drug use: Never  . Sexual activity: Yes    Birth control/protection: Post-menopausal  Other Topics Concern  . Not on file  Social History Narrative  . Not on file   Social Determinants of Health   Financial Resource Strain: Not on file  Food Insecurity: Not on file  Transportation Needs: Not on file  Physical Activity: Not on file  Stress: Not on file  Social Connections: Not on file      Family History:  The patient's family history includes Heart attack in her mother; Ovarian cancer in her sister.   Review of Systems:   Please see the history of present illness.     General:  No chills, fever, night sweats or weight changes.  Cardiovascular:  No chest pain, dyspnea on exertion, edema, orthopnea, paroxysmal nocturnal dyspnea. Positive for palpitations.  Dermatological: No rash, lesions/masses Respiratory: No cough, dyspnea Urologic: No hematuria, dysuria Abdominal:   No nausea, vomiting, diarrhea, bright red blood per rectum, melena, or hematemesis Neurologic:  No visual changes, wkns, changes in mental status. All other systems reviewed and are otherwise negative except as noted above.   Physical Exam:    VS:  BP 122/62   Pulse 60   Ht 5\' 4"  (1.626 m)   Wt 107 lb (48.5 kg)   BMI 18.37 kg/m    General: Well developed, well nourished,female appearing in no acute distress. Head: Normocephalic, atraumatic. Neck: No carotid bruits. JVD not elevated.  Lungs: Respirations regular and unlabored, without wheezes or rales.  Heart: Regular rate and rhythm. No S3 or S4.  No murmur, no rubs, or gallops appreciated. Abdomen: Appears non-distended. No obvious abdominal masses. Msk:  Strength and tone appear normal for age. No obvious joint deformities or effusions. Extremities: No clubbing or cyanosis. No lower extremity edema.  Distal pedal pulses are 2+ bilaterally. Neuro: Alert and oriented X 3. Moves all extremities spontaneously. No focal deficits noted. Psych:  Responds to questions appropriately with a normal affect. Skin: No rashes or lesions noted  Wt Readings from Last 3 Encounters:  12/09/20 107 lb (48.5 kg)  11/24/20 105 lb 9.6 oz (47.9 kg)  09/07/20 104 lb (47.2 kg)      Studies/Labs Reviewed:   EKG:  EKG is not ordered today.   Recent Labs: 07/28/2020: Hemoglobin 13.0; Platelets 199 11/16/2020: ALT 33; BUN 15; Creatinine, Ser 0.73; Potassium 4.0;  Sodium 141   Lipid Panel    Component Value Date/Time   CHOL 207 (H) 11/16/2020 0739   TRIG 104 11/16/2020 0739   HDL 69 11/16/2020 0739   CHOLHDL 2.6 09/09/2019 0808   CHOLHDL 2.9 12/23/2014 0840   VLDL 12 12/23/2014 0840   LDLCALC 120 (H) 11/16/2020 0739    Additional studies/ records that were reviewed today include:   GXT: 11/02/2020  No diagnostic ST segment changes at 80% MPHR and 9.2 METS. There were no significant arrhythmias. Hypertensive exercise response.  Blood pressure demonstrated a hypertensive response to exercise.   Assessment:    1. Paroxysmal atrial fibrillation (HCC)      Plan:   In order of problems listed above:  1.  Paroxysmal Atrial Fibrillation - She does experience brief palpitations when stressed or anxious but denies any symptoms resembling her prior atrial fibrillation. She has an Rx for Cardizem 30 mg PRN but has not had to utilize this recently. Not on a daily AV nodal blocking agent given baseline HR in the 40's to 50's. Remains on Flecainide 50mg  BID for antiarrhythmic therapy. - Her CHA2DS2-VASc Score and unadjusted Ischemic Stroke Rate (% per year) is equal to 2.2 % stroke rate/year from a score of 2 (Age, Female). She has been on Eliquis 5 mg twice daily and overall tolerated this well but reports her insurance is not going to cover this next year and Xarelto will be preferred. Therefore, will stop Eliquis and switch to Xarelto 20 mg daily which is the correct dosing at this time given her calculated creatinine clearance of 54 mL/min.    Medication Adjustments/Labs and Tests Ordered: Current medicines are reviewed at length with the patient today.  Concerns regarding medicines are outlined above.  Medication changes, Labs and Tests ordered today are listed in the Patient Instructions below. Patient Instructions  Medication Instructions:   Stop Eliquis. Start Xarelto 20mg  daily.   Labwork:  None.    Testing/Procedures:  None.  Follow-Up:  In 6 months with Pamela Pho, PA-C  Any Other Special Instructions Will Be Listed Below (If Applicable).   If you need a refill on your cardiac medications before your next appointment, please call your pharmacy.    Signed, Erma Heritage, PA-C  12/09/2020 1:28 PM    [facility] [address] [place], [place] [address] Phone: [phone] Fax: [phone]

## 2020-12-09 NOTE — Patient Instructions (Signed)
Medication Instructions:   Stop Eliquis. Start Xarelto 20mg  daily.   Labwork:  None.   Testing/Procedures:  None.  Follow-Up:  In 6 months with Bernerd Pho, PA-C  Any Other Special Instructions Will Be Listed Below (If Applicable).     If you need a refill on your cardiac medications before your next appointment, please call your pharmacy.

## 2020-12-10 ENCOUNTER — Telehealth (INDEPENDENT_AMBULATORY_CARE_PROVIDER_SITE_OTHER): Payer: Medicare Other | Admitting: Family Medicine

## 2020-12-10 ENCOUNTER — Telehealth: Payer: Self-pay | Admitting: Family Medicine

## 2020-12-10 DIAGNOSIS — F321 Major depressive disorder, single episode, moderate: Secondary | ICD-10-CM | POA: Diagnosis not present

## 2020-12-10 DIAGNOSIS — F418 Other specified anxiety disorders: Secondary | ICD-10-CM

## 2020-12-10 MED ORDER — SERTRALINE HCL 50 MG PO TABS: 50.0000 mg | ORAL_TABLET | Freq: Every day | ORAL | 3 refills | Status: DC

## 2020-12-10 NOTE — Addendum Note (Signed)
Addended by: Dairl Ponder on: 12/10/2020 04:44 PM   Modules accepted: Orders

## 2020-12-10 NOTE — Progress Notes (Signed)
Subjective:    Patient ID: Pamela Yang, female    DOB: 12-18-1951, 69 y.o.   MRN: [id number]  HPI Virtual Visit via Telephone Note  I connected with Pamela Yang on 12/10/20 at 10:00 AM EST by telephone and verified that I am speaking with the correct person using two identifiers.  Location: Patient: home Provider: office   I discussed the limitations, risks, security and privacy concerns of performing an evaluation and management service by telephone and the availability of in person appointments. I also discussed with the patient that there may be a patient responsible charge related to this service. The patient expressed understanding and agreed to proceed.   History of Present Illness:    Observations/Objective:   Assessment and Plan:   Follow Up Instructions:    I discussed the assessment and treatment plan with the patient. The patient was provided an opportunity to ask questions and all were answered. The patient agreed with the plan and demonstrated an understanding of the instructions.   The patient was advised to call back or seek an in-person evaluation if the symptoms worsen or if the condition fails to improve as anticipated. Long discussion with the patient regarding her depression Having a lot of depression related to the loss of her husband Despite doing the best she can try to work her way through this it has been very debilitating Is been worse recently She is interested in restarting medicine Previously Celexa had interactions with the drug so therefore we would need to choose something else she is not suicidal.  She is open to counseling we will set that up I provided 25 minutes of non-face-to-face time during this encounter.  Patient calls in to discuss depression and anxiety.  See phq9 and gad7. [place] from 12/10/2020 in [place]  PHQ-9 Total Score 15     Depression screen [facility] 2/9 12/10/2020 11/24/2020  03/09/2020 09/04/2018 02/28/2018  Decreased Interest 2 1 1 1 1   Down, Depressed, Hopeless 2 1 1  0 1  PHQ - 2 Score 4 2 2 1 2   Altered sleeping 3 2 3  0 1  Tired, decreased energy 0 1 1 0 1  Change in appetite 1 2 2  0 1  Feeling bad or failure about yourself  2 0 0 0 0  Trouble concentrating 3 1 1  0 0  Moving slowly or fidgety/restless 2 0 0 0 0  Suicidal thoughts 0 0 0 0 0  PHQ-9 Score 15 8 9 1 5   Difficult doing work/chores Very difficult Somewhat difficult Somewhat difficult Not difficult at all Not difficult at all   generalized anxiety disorder GAD 7 : Generalized Anxiety Score 12/10/2020 03/09/2020 09/04/2018  Nervous, Anxious, on Edge 3 1 0  Control/stop worrying 2 1 0  Worry too much - different things 2 1 0  Trouble relaxing 2 2 0  Restless 3 2 3   Easily annoyed or irritable 3 1 0  Afraid - awful might happen 0 0 0  Total GAD 7 Score 15 8 3   Anxiety Difficulty Very difficult Somewhat difficult Somewhat difficult     Review of Systems     Objective:   Physical Exam Today's visit was via telephone Physical exam was not possible for this visit        Assessment & Plan:  Severe depression Start Zoloft-side effects discussed Do a follow-up office visit virtual in 2 to 3 weeks with a follow-up after that in 6 to 8  weeks If patient has progressive troubles or problems notify us.

## 2020-12-10 NOTE — Telephone Encounter (Signed)
Nurses Please connect with patient Let her know that we will go ahead and start Zoloft It does get along with her other medicines It works very similar to Celexa I would recommend the 50 mg tablet initially she will start off with a half a tablet daily for the first 7 days then after that 1 daily It can cause mild nausea in the first week but typically that goes away after the first week I would like to do a follow-up phone visit with her in 2 weeks I have already sent her medicine to [facility]   Also go ahead with referral for depression and grief to [facility] in [place] she will need counseling and perhaps psychiatry consultation She prefers to utilize her cell phone number not her home number [facility]

## 2020-12-10 NOTE — Telephone Encounter (Addendum)
Discussed with patient. Patient verbalized understanding and will pick up med from Pamela Yang and schedule follow up in 2 weeks. Referral ordered in [facility]

## 2020-12-16 DIAGNOSIS — H5213 Myopia, bilateral: Secondary | ICD-10-CM | POA: Diagnosis not present

## 2021-01-15 ENCOUNTER — Ambulatory Visit (INDEPENDENT_AMBULATORY_CARE_PROVIDER_SITE_OTHER): Payer: Medicare Other | Admitting: Psychologist

## 2021-01-15 DIAGNOSIS — F320 Major depressive disorder, single episode, mild: Secondary | ICD-10-CM

## 2021-01-20 ENCOUNTER — Ambulatory Visit (INDEPENDENT_AMBULATORY_CARE_PROVIDER_SITE_OTHER): Payer: Medicare Other | Admitting: Psychologist

## 2021-01-20 DIAGNOSIS — F320 Major depressive disorder, single episode, mild: Secondary | ICD-10-CM

## 2021-01-25 ENCOUNTER — Other Ambulatory Visit (HOSPITAL_COMMUNITY): Payer: Self-pay | Admitting: Family Medicine

## 2021-01-25 ENCOUNTER — Ambulatory Visit (INDEPENDENT_AMBULATORY_CARE_PROVIDER_SITE_OTHER): Payer: Medicare Other | Admitting: Psychologist

## 2021-01-25 DIAGNOSIS — F320 Major depressive disorder, single episode, mild: Secondary | ICD-10-CM | POA: Diagnosis not present

## 2021-01-25 DIAGNOSIS — Z1231 Encounter for screening mammogram for malignant neoplasm of breast: Secondary | ICD-10-CM

## 2021-02-18 ENCOUNTER — Ambulatory Visit (HOSPITAL_COMMUNITY)
Admission: RE | Admit: 2021-02-18 | Discharge: 2021-02-18 | Disposition: A | Discharge status: Home / Self Care | Payer: Medicare Other | Source: Ambulatory Visit | Attending: Family Medicine | Admitting: Family Medicine

## 2021-02-18 ENCOUNTER — Other Ambulatory Visit: Payer: Self-pay

## 2021-02-18 DIAGNOSIS — Z1231 Encounter for screening mammogram for malignant neoplasm of breast: Secondary | ICD-10-CM | POA: Insufficient documentation

## 2021-02-22 ENCOUNTER — Encounter: Payer: Self-pay | Admitting: Family Medicine

## 2021-03-03 ENCOUNTER — Ambulatory Visit: Payer: Medicare Other | Admitting: Dermatology

## 2021-03-23 ENCOUNTER — Ambulatory Visit (INDEPENDENT_AMBULATORY_CARE_PROVIDER_SITE_OTHER): Payer: Medicare Other | Admitting: Psychologist

## 2021-03-23 DIAGNOSIS — F320 Major depressive disorder, single episode, mild: Secondary | ICD-10-CM | POA: Diagnosis not present

## 2021-04-06 ENCOUNTER — Other Ambulatory Visit: Payer: Self-pay

## 2021-04-06 ENCOUNTER — Ambulatory Visit: Payer: Medicare Other | Admitting: Dermatology

## 2021-04-06 ENCOUNTER — Encounter: Payer: Self-pay | Admitting: Dermatology

## 2021-04-06 DIAGNOSIS — D1801 Hemangioma of skin and subcutaneous tissue: Secondary | ICD-10-CM | POA: Diagnosis not present

## 2021-04-06 DIAGNOSIS — Z1283 Encounter for screening for malignant neoplasm of skin: Secondary | ICD-10-CM | POA: Diagnosis not present

## 2021-04-06 DIAGNOSIS — L299 Pruritus, unspecified: Secondary | ICD-10-CM | POA: Diagnosis not present

## 2021-04-06 DIAGNOSIS — L570 Actinic keratosis: Secondary | ICD-10-CM

## 2021-04-12 ENCOUNTER — Telehealth: Payer: Self-pay | Admitting: Family Medicine

## 2021-04-12 DIAGNOSIS — F418 Other specified anxiety disorders: Secondary | ICD-10-CM

## 2021-04-12 NOTE — Telephone Encounter (Signed)
This +1 refill needs follow-up visit by May or June

## 2021-04-12 NOTE — Telephone Encounter (Signed)
Started on med in dec and was suppose to do follow up in 2 weeks but has not had one

## 2021-04-13 NOTE — Telephone Encounter (Signed)
Sent my chart message to schedule appointment.

## 2021-04-15 ENCOUNTER — Encounter: Payer: Self-pay | Admitting: Dermatology

## 2021-04-15 NOTE — Telephone Encounter (Signed)
Patient schedule appointment for 5/16 for medication refill

## 2021-04-15 NOTE — Progress Notes (Signed)
   Follow-Up Visit   Subjective  Pamela Yang is a 70 y.o. female who presents for the following: Skin Problem (Wants spots on face & back checked- mid forehead turns red, left cheek and back- + itch).  General skin examination, several spots to check Location:  Duration:  Quality:  Associated Signs/Symptoms: Modifying Factors:  Severity:  Timing: Context:   Objective  Well appearing patient in no apparent distress; mood and affect are within normal limits. Objective  Right Malar Cheek, Right Nasal Sidewall: Hornlike 3 to 4 mm pink crusts  Objective  Head - Anterior (Face), Left Forearm - Anterior, Right Forearm - Anterior: Head, neck and arms checked for skin cancers. No atypical moles or non mole skin cancers.   Objective  Right Forearm - Anterior: Multiple red dots 1 to 56mm scattered on trunk and extremities  Objective  Right Flank, Right Forearm - Anterior: Itching with no lesions compatible with neurogenic pruritus    All sun exposed areas plus back examined.   Assessment & Plan    AK (actinic keratosis) (2) Right Nasal Sidewall; Right Malar Cheek  Destruction of lesion - Right Malar Cheek, Right Nasal Sidewall Complexity: simple   Destruction method: cryotherapy   Informed consent: discussed and consent obtained   Timeout:  patient name, date of birth, surgical site, and procedure verified Lesion destroyed using liquid nitrogen: Yes   Cryotherapy cycles:  4 Outcome: patient tolerated procedure well with no complications   Post-procedure details: wound care instructions given    Screening for malignant neoplasm of skin (3) Head - Anterior (Face); Left Forearm - Anterior; Right Forearm - Anterior  Yearly skin exams.   Hemangioma of skin Right Forearm - Anterior  Benign no treatment needed.   Pruritus (2) Right Forearm - Anterior; Right Flank  Gave patient samples of cerave anti itch.  May look for any over-the-counter lotion with the  ingredient pramoxine.      I, Lavonna Monarch, MD, have reviewed all documentation for this visit.  The documentation on 04/15/21 for the exam, diagnosis, procedures, and orders are all accurate and complete.

## 2021-04-15 NOTE — Addendum Note (Signed)
Addended by: Lavonna Monarch on: 04/15/2021 06:26 AM   Modules accepted: Level of Service

## 2021-04-15 NOTE — Telephone Encounter (Signed)
Schedule appointment?

## 2021-05-03 ENCOUNTER — Encounter: Payer: Self-pay | Admitting: Family Medicine

## 2021-05-03 ENCOUNTER — Other Ambulatory Visit: Payer: Self-pay

## 2021-05-03 ENCOUNTER — Ambulatory Visit (INDEPENDENT_AMBULATORY_CARE_PROVIDER_SITE_OTHER): Payer: Medicare Other | Admitting: Family Medicine

## 2021-05-03 VITALS — BP 92/60 | Temp 98.1°F | Wt 106.6 lb

## 2021-05-03 DIAGNOSIS — R001 Bradycardia, unspecified: Secondary | ICD-10-CM

## 2021-05-03 DIAGNOSIS — R7401 Elevation of levels of liver transaminase levels: Secondary | ICD-10-CM

## 2021-05-03 DIAGNOSIS — F418 Other specified anxiety disorders: Secondary | ICD-10-CM | POA: Diagnosis not present

## 2021-05-03 DIAGNOSIS — Z1329 Encounter for screening for other suspected endocrine disorder: Secondary | ICD-10-CM

## 2021-05-03 DIAGNOSIS — R42 Dizziness and giddiness: Secondary | ICD-10-CM

## 2021-05-03 DIAGNOSIS — Z79899 Other long term (current) drug therapy: Secondary | ICD-10-CM

## 2021-05-03 MED ORDER — SERTRALINE HCL 50 MG PO TABS: ORAL_TABLET | ORAL | 6 refills | Status: DC

## 2021-05-03 NOTE — Progress Notes (Signed)
   Subjective:    Patient ID: Pamela Yang, female    DOB: 04-16-1951, 70 y.o.   MRN: [id number]  HPI Pt here for medication follow up. Pt taking all meds as directed with no issues.  Patient feels that the medication is doing a good job for her she denies any severe depression currently she feels that her moods overall are doing better she would like to continue the medicine She also suffers with bradycardia.  She has had episodes of atrial fibrillation but none recently  She states she had a couple different episodes in the past month where late at night or in the middle of the night she woke up feeling woozy or dizzy and she noted that her heart rate was in the 30s  Review of Systems     Objective:   Physical Exam  Lungs are clear heart bradycardic pulse bradycardic extremities no edema skin warm dry blood pressure sitting standing normal but on the low end      Assessment & Plan:  1. Bradycardia Patient with bradycardia EKG ordered because she is having dizziness with the bradycardia has follow-up with cardiology coming up - EKG 12-Lead  2. Elevated liver transaminase level Recommend repeat liver profile await findings - Hepatic function panel - TSH - T4, free - Basic Metabolic Panel (BMET) - Magnesium  3. Dizzy spells It is hard to know that these dizzy spells are related to the bradycardia but she states when she was having this her heart rate was 38 which certainly seems low we will connect with cardiology - EKG 12-Lead  4. Depression with anxiety Doing much better continue medications until this fall if doing well at that time consider tapering off patient adjusting much better to the loss of her husband - Hepatic function panel - TSH - T4, free - Basic Metabolic Panel (BMET) - Magnesium  5. High risk medication use See above - Hepatic function panel - TSH - T4, free - Basic Metabolic Panel (BMET) - Magnesium  6. Screening for thyroid  disorder Screening due to the bradycardia spells - Hepatic function panel - TSH - T4, free - Basic Metabolic Panel (BMET) - Magnesium

## 2021-05-04 NOTE — Progress Notes (Signed)
Called and discussed with pt.

## 2021-05-04 NOTE — Progress Notes (Signed)
Nurses-please notify patient that I did communicate with cardiology about her situation and cardiology will connect with her about doing home telemetry monitor, and she should keep her appointment with that is coming up with cardiology thanks

## 2021-05-05 DIAGNOSIS — Z1329 Encounter for screening for other suspected endocrine disorder: Secondary | ICD-10-CM | POA: Diagnosis not present

## 2021-05-05 DIAGNOSIS — F418 Other specified anxiety disorders: Secondary | ICD-10-CM | POA: Diagnosis not present

## 2021-05-05 DIAGNOSIS — Z79899 Other long term (current) drug therapy: Secondary | ICD-10-CM | POA: Diagnosis not present

## 2021-05-05 DIAGNOSIS — R7401 Elevation of levels of liver transaminase levels: Secondary | ICD-10-CM | POA: Diagnosis not present

## 2021-05-06 ENCOUNTER — Encounter (INDEPENDENT_AMBULATORY_CARE_PROVIDER_SITE_OTHER): Payer: Self-pay | Admitting: *Deleted

## 2021-05-06 ENCOUNTER — Other Ambulatory Visit: Payer: Self-pay | Admitting: Family Medicine

## 2021-05-06 ENCOUNTER — Ambulatory Visit (INDEPENDENT_AMBULATORY_CARE_PROVIDER_SITE_OTHER): Payer: Medicare Other

## 2021-05-06 ENCOUNTER — Telehealth: Payer: Self-pay

## 2021-05-06 ENCOUNTER — Other Ambulatory Visit: Payer: Self-pay | Admitting: Cardiology

## 2021-05-06 DIAGNOSIS — R001 Bradycardia, unspecified: Secondary | ICD-10-CM

## 2021-05-06 DIAGNOSIS — R7401 Elevation of levels of liver transaminase levels: Secondary | ICD-10-CM

## 2021-05-06 DIAGNOSIS — R748 Abnormal levels of other serum enzymes: Secondary | ICD-10-CM

## 2021-05-06 DIAGNOSIS — Z1211 Encounter for screening for malignant neoplasm of colon: Secondary | ICD-10-CM

## 2021-05-06 LAB — HEPATIC FUNCTION PANEL
ALT: 33 IU/L — ABNORMAL HIGH (ref 0–32)
AST: 30 IU/L (ref 0–40)
Albumin: 4.6 g/dL (ref 3.7–4.7)
Alkaline Phosphatase: 91 IU/L (ref 44–121)
Bilirubin Total: 0.2 mg/dL (ref 0.0–1.2)
Bilirubin, Direct: <0.1 mg/dL (ref 0.00–0.40)
Total Protein: 6.8 g/dL (ref 6.0–8.5)

## 2021-05-06 LAB — MAGNESIUM: Magnesium: 2.3 mg/dL (ref 1.6–2.3)

## 2021-05-06 LAB — BASIC METABOLIC PANEL
BUN/Creatinine Ratio: 35 — ABNORMAL HIGH (ref 12–28)
BUN: 37 mg/dL — ABNORMAL HIGH (ref 8–27)
CO2: 23 mmol/L (ref 20–29)
Calcium: 10.1 mg/dL (ref 8.7–10.3)
Chloride: 98 mmol/L (ref 96–106)
Creatinine, Ser: 1.07 mg/dL — ABNORMAL HIGH (ref 0.57–1.00)
Glucose: 98 mg/dL (ref 65–99)
Potassium: 4.7 mmol/L (ref 3.5–5.2)
Sodium: 142 mmol/L (ref 134–144)
eGFR: 56 mL/min/{1.73_m2} — ABNORMAL LOW (ref >59)

## 2021-05-06 LAB — T4, FREE: Free T4: 1.07 ng/dL (ref 0.82–1.77)

## 2021-05-06 LAB — TSH: TSH: 1.49 u[IU]/mL (ref 0.450–4.500)

## 2021-05-06 NOTE — Telephone Encounter (Signed)
Enrolled in Zio monitor.

## 2021-05-06 NOTE — Telephone Encounter (Signed)
-----   Message from Imogene Burn, PA-C sent at 05/04/2021  8:33 AM EDT ----- Dr. Wolfgang Phoenix, I'm covering for [place] while on vacation. Thank you for the note. I agree she can benefit from a monitor and will arrange to be done now before her appt.  Tye [place], can you order a 2 week Zio on this patient for bradycardia and dizziness?  Thanks, Ermalinda Barrios PA-C ----- Message ----- From: Kathyrn Drown, MD Sent: 05/03/2021   9:40 PM EDT To: Erma Heritage, PA-C  Hi Brittany-mutual patient-has had a couple spells over the past month where she felt dizzy sitting or laying down and noted that her heart rate was in the 30s.  Blood pressure on the lower end of normal.  EKG in office showed sinus bradycardia Did not know if she would benefit from monitor for a few days?  She has an appointment with you in June.  Thanks for any help you give her-Scott

## 2021-05-08 DIAGNOSIS — R001 Bradycardia, unspecified: Secondary | ICD-10-CM | POA: Diagnosis not present

## 2021-05-18 ENCOUNTER — Ambulatory Visit (HOSPITAL_COMMUNITY): Payer: Medicare Other

## 2021-05-26 ENCOUNTER — Ambulatory Visit (HOSPITAL_COMMUNITY)
Admission: RE | Admit: 2021-05-26 | Discharge: 2021-05-26 | Disposition: A | Discharge status: Home / Self Care | Payer: Medicare Other | Source: Ambulatory Visit | Attending: Family Medicine | Admitting: Family Medicine

## 2021-05-26 DIAGNOSIS — R748 Abnormal levels of other serum enzymes: Secondary | ICD-10-CM | POA: Insufficient documentation

## 2021-05-26 DIAGNOSIS — R7989 Other specified abnormal findings of blood chemistry: Secondary | ICD-10-CM | POA: Diagnosis not present

## 2021-05-28 DIAGNOSIS — M810 Age-related osteoporosis without current pathological fracture: Secondary | ICD-10-CM | POA: Diagnosis not present

## 2021-05-28 DIAGNOSIS — R001 Bradycardia, unspecified: Secondary | ICD-10-CM | POA: Diagnosis not present

## 2021-06-02 DIAGNOSIS — R7401 Elevation of levels of liver transaminase levels: Secondary | ICD-10-CM | POA: Diagnosis not present

## 2021-06-02 DIAGNOSIS — R748 Abnormal levels of other serum enzymes: Secondary | ICD-10-CM | POA: Diagnosis not present

## 2021-06-03 LAB — IRON AND TIBC
Iron Saturation: 32 % (ref 15–55)
Iron: 96 ug/dL (ref 27–139)
Total Iron Binding Capacity: 304 ug/dL (ref 250–450)
UIBC: 208 ug/dL (ref 118–369)

## 2021-06-03 LAB — HEPATITIS B SURFACE ANTIGEN: Hepatitis B Surface Ag: NEGATIVE

## 2021-06-03 LAB — FERRITIN: Ferritin: 252 ng/mL — ABNORMAL HIGH (ref 15–150)

## 2021-06-03 LAB — HEPATITIS C ANTIBODY: Hep C Virus Ab: <0.1 s/co ratio (ref 0.0–0.9)

## 2021-06-09 ENCOUNTER — Encounter (INDEPENDENT_AMBULATORY_CARE_PROVIDER_SITE_OTHER): Payer: Self-pay | Admitting: *Deleted

## 2021-06-10 ENCOUNTER — Telehealth (INDEPENDENT_AMBULATORY_CARE_PROVIDER_SITE_OTHER): Payer: Self-pay

## 2021-06-10 ENCOUNTER — Telehealth: Payer: Self-pay

## 2021-06-10 ENCOUNTER — Other Ambulatory Visit (INDEPENDENT_AMBULATORY_CARE_PROVIDER_SITE_OTHER): Payer: Self-pay

## 2021-06-10 ENCOUNTER — Encounter (INDEPENDENT_AMBULATORY_CARE_PROVIDER_SITE_OTHER): Payer: Self-pay

## 2021-06-10 DIAGNOSIS — Z1211 Encounter for screening for malignant neoplasm of colon: Secondary | ICD-10-CM

## 2021-06-10 MED ORDER — PEG 3350-KCL-NA BICARB-NACL 420 G PO SOLR: 4000.0000 mL | ORAL | 0 refills | Status: DC

## 2021-06-10 NOTE — Telephone Encounter (Signed)
   [facility] Pre-operative Risk Assessment    Patient Name: Pamela Yang  DOB: 1951-07-13  MRN: [id number]   [place]: - Please ensure there is not already an duplicate clearance open for this procedure. - Under Visit Info/Reason for Call, type in Other and utilize the format Clearance MM/DD/YY or Clearance TBD. Do not use dashes or single digits. - If request is for dental extraction, please clarify the # of teeth to be extracted. - If the patient is currently at the dentist's office, call Pre-Op APP to address. If the patient is not currently in the dentist office, please route to the Pre-Op pool  Request for surgical clearance:  What type of surgery is being performed? Colonoscopy   When is this surgery scheduled? 07/08/21   What type of clearance is required (medical clearance vs. Pharmacy clearance to hold med vs. Both)? Pharmacy  Are there any medications that need to be held prior to surgery and how long? Xarelto 2 days    Practice name and name of physician performing surgery? Dr Hildred Laser at [place]   What is the office phone number? [phone]   7.   What is the office fax number? [phone]  8.   Anesthesia type (None, local, MAC, general) ? Claiborne Rigg T 06/10/2021, 2:18 PM  _________________________________________________________________   (provider comments below)

## 2021-06-10 NOTE — Telephone Encounter (Signed)
Referring MD/PCP: Sallee Lange  Procedure: Tcs  Reason/Indication:  Screening  Has patient had this procedure before?  yes  If so, when, by whom and where? 2012    Is there a family history of colon cancer?  no  Who?  What age when diagnosed?    Is patient diabetic? If yes, Type 1 or Type 2   no      Does patient have prosthetic heart valve or mechanical valve?  no  Do you have a pacemaker/defibrillator?  no  Has patient ever had endocarditis/atrial fibrillation? no  Does patient use oxygen? no  Has patient had joint replacement within last 12 months?  no  Is patient constipated or do they take laxatives? no  Does patient have a history of alcohol/drug use?  no  Have you had a stroke/heart attack last 6 mths? no  Do you take medicine for weight loss?  no  For female patients,: do you still have your menstrual cycle? no  Is patient on blood thinner such as Coumadin, Plavix and/or Aspirin? yes  Medications: Xarelto 20 mg daily, Sertraline 50 mg daily, Flecainide 50 md bid, MVI Daily, Vit C 500 mg bid, Iron 65 mg daily, Flax seed oil 1200 mg bid  Allergies: nkda  Medication Adjustment per Dr Laural Golden HOLD IRON 10 [place] 2 DAYS PRIOR  Procedure date & time: 07/08/21 10:55

## 2021-06-10 NOTE — Telephone Encounter (Signed)
Pamela Yang, CMA  

## 2021-06-11 NOTE — Telephone Encounter (Signed)
Patient with diagnosis of afib on Xarelto for anticoagulation.    Procedure: colonoscopy Date of procedure: 07/08/21  CHA2DS2-VASc Score = 2  This indicates a 2.2% annual risk of stroke. The patient's score is based upon: CHF History: No HTN History: No Diabetes History: No Stroke History: No Vascular Disease History: No Age Score: 1 Gender Score: 1     CrCl 36 ml/min Platelet count 199  Per office protocol, patient can hold Xarelto for 2 days prior to procedure.

## 2021-06-11 NOTE — Telephone Encounter (Signed)
   Primary Cardiologist: Carlyle Dolly, MD  Chart reviewed as part of pre-operative protocol coverage. Given past medical history and time since last visit, based on ACC/AHA guidelines, [name] would be at acceptable risk for the planned procedure without further cardiovascular testing.   Patient with diagnosis of afib on Xarelto for anticoagulation.     Procedure: colonoscopy Date of procedure: 07/08/21   CHA2DS2-VASc Score = 2  This indicates a 2.2% annual risk of stroke. The patient's score is based upon: CHF History: No HTN History: No Diabetes History: No Stroke History: No Vascular Disease History: No Age Score: 1 Gender Score: 1     CrCl 36 ml/min Platelet count 199   Per office protocol, patient can hold Xarelto for 2 days prior to procedure.  I will route this recommendation to the requesting party via Epic fax function and remove from pre-op pool.  Please call with questions.  Jossie Ng. Pavle Wiler NP-C    06/11/2021, 8:41 AM [address] Office [phone] Fax [phone]

## 2021-06-14 ENCOUNTER — Other Ambulatory Visit: Payer: Self-pay | Admitting: Cardiology

## 2021-06-15 ENCOUNTER — Other Ambulatory Visit: Payer: Self-pay

## 2021-06-15 ENCOUNTER — Ambulatory Visit: Payer: Medicare Other | Admitting: Student

## 2021-06-15 ENCOUNTER — Encounter: Payer: Self-pay | Admitting: Student

## 2021-06-15 VITALS — BP 132/82 | HR 60 | Ht 64.0 in | Wt 105.8 lb

## 2021-06-15 DIAGNOSIS — R001 Bradycardia, unspecified: Secondary | ICD-10-CM

## 2021-06-15 DIAGNOSIS — Z79899 Other long term (current) drug therapy: Secondary | ICD-10-CM | POA: Diagnosis not present

## 2021-06-15 DIAGNOSIS — I480 Paroxysmal atrial fibrillation: Secondary | ICD-10-CM | POA: Diagnosis not present

## 2021-06-15 NOTE — Progress Notes (Signed)
Cardiology Office Note    Date:  06/15/2021   ID:  Malaya, Cagley 1951-06-03, MRN [id number]  PCP:  Kathyrn Drown, MD  Cardiologist: Carlyle Dolly, MD    Chief Complaint  Patient presents with   Follow-up    6 month visit and to review monitor results    History of Present Illness:    Pamela Yang is a 70 y.o. female with past medical history of paroxysmal atrial fibrillation (on Flecainide) and Osteoporosis who presents to the office today for 5-month follow-up.  She was last examined myself in 11/2020 and did report occasional palpitations when feeling stressed or anxious. She was continued on her current medication regimen including Flecainide 50 mg twice daily along with Xarelto for anticoagulation and PRN Cardizem 30 mg.  She was evaluated by her PCP in 04/2021 and reported episodes of bradycardia with associated dizziness. He communicated with our office and a 2-week ZIO monitor was recommended for further evaluation. Her monitor showed predominantly normal sinus rhythm with rare episodes of SVT up to 8 beats. She had a minimum heart rate of 36 bpm with an average of 50 bpm.    In talking with the patient today, she reports only 1 episode of dizziness since wearing her heart monitor. Reports her episodes typically occurred during the night and spontaneously resolved.  She does remain active at baseline and reports walking her dogs frequently during the day and denies any dizziness or presyncope with this. No recent chest pain, dyspnea on exertion, orthopnea, PND or pitting edema. She has an Rx for PRN Cardizem but has never had to utilize this.    Past Medical History:  Diagnosis Date   Atrial fibrillation (HCC)    GAD (generalized anxiety disorder)    Gastroparesis 5/08   Dr. Stann Mainland   GERD (gastroesophageal reflux disease)    Hearing loss    Osteoporosis     Past Surgical History:  Procedure Laterality Date   COLONOSCOPY  6/12   Dr Laural Golden    Current  Medications: Outpatient Medications Prior to Visit  Medication Sig Dispense Refill   Ascorbic Acid (VITAMIN C PO) Take 1 tablet by mouth daily.      denosumab (PROLIA) 60 MG/ML SOLN injection Inject 60 mg into the skin every 6 (six) months. Administer in upper arm, thigh, or abdomen     diltiazem (CARDIZEM) 30 MG tablet Take 1 tablet (30 mg total) by mouth as needed (atrial fibrillation). 30 tablet 1   Flaxseed, Linseed, (FLAXSEED OIL PO) Take 2 tablets by mouth daily.      flecainide (TAMBOCOR) 50 MG tablet TAKE ONE TABLET BY MOUTH TWICE A DAY 60 tablet 1   IRON PO Take 1 tablet by mouth daily.      Multiple Vitamins-Minerals (MULTIVITAMIN WITH MINERALS) tablet Take 1 tablet by mouth daily.     polyethylene glycol-electrolytes (TRILYTE) 420 g solution Take 4,000 mLs by mouth as directed. 4000 mL 0   Propylene Glycol (SYSTANE BALANCE) 0.6 % SOLN Apply 2 drops to eye in the morning and at bedtime. Into both eyes     rivaroxaban (XARELTO) 20 MG TABS tablet Take 1 tablet (20 mg total) by mouth daily with supper. 30 tablet 11   sertraline (ZOLOFT) 50 MG tablet TAKE ONE (1) TABLET BY MOUTH EVERY DAY 30 tablet 6   No facility-administered medications prior to visit.     Allergies:   Patient has no known allergies.   Social History  Socioeconomic History   Marital status: Widowed    Spouse name: Not on file   Number of children: Not on file   Years of education: Not on file   Highest education level: Not on file  Occupational History   Not on file  Tobacco Use   Smoking status: Former    Pack years: 0.00    Types: Cigarettes    Quit date: 12/20/1979    Years since quitting: 41.5   Smokeless tobacco: Never  Vaping Use   Vaping Use: Never used  Substance and Sexual Activity   Alcohol use: Yes    Comment: occ   Drug use: Never   Sexual activity: Yes    Birth control/protection: Post-menopausal  Other Topics Concern   Not on file  Social History Narrative   Not on file   Social  Determinants of Health   Financial Resource Strain: Not on file  Food Insecurity: Not on file  Transportation Needs: Not on file  Physical Activity: Not on file  Stress: Not on file  Social Connections: Not on file     Family History:  The patient's family history includes Heart attack in her mother; Ovarian cancer in her sister.   Review of Systems:    Please see the history of present illness.     All other systems reviewed and are otherwise negative except as noted above.   Physical Exam:    VS:  BP 132/82   Pulse 60   Ht 5\' 4"  (1.626 m)   Wt 105 lb 12.8 oz (48 kg)   SpO2 95%   BMI 18.16 kg/m    General: Well developed, well nourished,female appearing in no acute distress. Head: Normocephalic, atraumatic. Neck: No carotid bruits. JVD not elevated.  Lungs: Respirations regular and unlabored, without wheezes or rales.  Heart: Regular rate and rhythm. No S3 or S4.  No murmur, no rubs, or gallops appreciated. Abdomen: Appears non-distended. No obvious abdominal masses. Msk:  Strength and tone appear normal for age. No obvious joint deformities or effusions. Extremities: No clubbing or cyanosis. No pitting edema.  Distal pedal pulses are 2+ bilaterally. Neuro: Alert and oriented X 3. Moves all extremities spontaneously. No focal deficits noted. Psych:  Responds to questions appropriately with a normal affect. Skin: No rashes or lesions noted  Wt Readings from Last 3 Encounters:  06/15/21 105 lb 12.8 oz (48 kg)  05/03/21 106 lb 9.6 oz (48.4 kg)  12/09/20 107 lb (48.5 kg)     Studies/Labs Reviewed:   EKG:  EKG is not ordered today.    Recent Labs: 07/28/2020: Hemoglobin 13.0; Platelets 199 05/05/2021: ALT 33; BUN 37; Creatinine, Ser 1.07; Magnesium 2.3; Potassium 4.7; Sodium 142; TSH 1.490   Lipid Panel    Component Value Date/Time   CHOL 207 (H) 11/16/2020 0739   TRIG 104 11/16/2020 0739   HDL 69 11/16/2020 0739   CHOLHDL 2.6 09/09/2019 0808   CHOLHDL 2.9  12/23/2014 0840   VLDL 12 12/23/2014 0840   LDLCALC 120 (H) 11/16/2020 0739    Additional studies/ records that were reviewed today include:   GXT: 10/2020 No diagnostic ST segment changes at 80% MPHR and 9.2 METS. There were no significant arrhythmias. Hypertensive exercise response. Blood pressure demonstrated a hypertensive response to exercise.   Event Monitor: 05/2021 Occasional supraventricular ectopy. Rare short runs of SVT up to 8 beats Rare ventricular ectopy Reported symptoms of dizziness correlated with sinus bradycardia at 42 bpm, rare PACs  Patch Wear Time:  14 days and 0 hours (2022-05-21T20:45:55-0400 to 2022-06-04T20:45:59-0400)   Patient had a min HR of 36 bpm, max HR of 167 bpm, and avg HR of 50 bpm. Predominant underlying rhythm was Sinus Rhythm. 11 Supraventricular Tachycardia runs occurred, the run with the fastest interval lasting 7 beats with a max rate of 167 bpm, the longest lasting 8 beats with an avg rate of 104 bpm. Some episodes of Supraventricular Tachycardia may be possible Atrial Tachycardia with variable block. Isolated SVEs were occasional (1.1%, 9059), SVE Couplets were rare (<1.0%, 169), and SVE Triplets were rare (<1.0%, 25). Isolated VEs were rare (<1.0%), and no VE Couplets or VE Triplets were present.   Assessment:    1. Paroxysmal atrial fibrillation (HCC)   2. Medication management   3. Bradycardia      Plan:   In order of problems listed above:  1. Paroxysmal Atrial Fibrillation - She denies any recent palpitations and her monitor last month showed predominantly normal sinus rhythm with no recurrence. She remains on Flecainide 50 mg twice daily. She does have an Rx for PRN Cardizem but has not had to utilize this and has not been on daily AV nodal blocking agents given her bradycardia. - Her calculated creatinine clearance based off most recent labs is less than 50 but was previously greater than 50 by prior labs. I did recommend  that she have a repeat BMET in 2 months. If creatinine clearance remains less than 50 based off her labs and current weight, would reduce Xarelto from 20 mg daily to 15 mg daily.  2. Bradycardia - Her recent monitor showed heart rate was in the 30's at times but this typically occurred in the nighttime hours and her average heart rate was 50 bpm which is close to her known baseline.  No evidence of significant pauses or high-grade AV block. She denies any recent dizziness or presyncope. Reviewed warning signs to monitor for.  Will continue with close monitoring for now and continue to avoid AV nodal blocking agents.    Medication Adjustments/Labs and Tests Ordered: Current medicines are reviewed at length with the patient today.  Concerns regarding medicines are outlined above.  Medication changes, Labs and Tests ordered today are listed in the Patient Instructions below. Patient Instructions  Medication Instructions:  Your physician recommends that you continue on your current medications as directed. Please refer to the Current Medication list given to you today.  *If you need a refill on your cardiac medications before your next appointment, please call your pharmacy*   Lab Work: Your physician recommends that you return for lab work in: 2 Months   If you have labs (blood work) drawn today and your tests are completely normal, you will receive your results only by: [facility] (if you have MyChart) OR A paper copy in the mail If you have any lab test that is abnormal or we need to change your treatment, we will call you to review the results.   Testing/Procedures: NONE    Follow-Up: At [facility], you and your health needs are our priority.  As part of our continuing mission to provide you with exceptional heart care, we have created designated Provider Care Teams.  These Care Teams include your primary Cardiologist (physician) and Advanced Practice Providers (APPs -   Physician Assistants and Nurse Practitioners) who all work together to provide you with the care you need, when you need it.  We recommend signing up for the patient  portal called "MyChart".  Sign up information is provided on this After Visit Summary.  MyChart is used to connect with patients for Virtual Visits (Telemedicine).  Patients are able to view lab/test results, encounter notes, upcoming appointments, etc.  Non-urgent messages can be sent to your provider as well.   To learn more about what you can do with MyChart, go to [email or web address].    Your next appointment:   6 month(s)  The format for your next appointment:   In Person  Provider:   Carlyle Dolly, MD   Other Instructions Thank you for choosing [facility]!     Signed, Erma Heritage, PA-C  06/15/2021 5:13 PM    [facility] [address] [place], [place] [address] Phone: [phone] Fax: [phone]

## 2021-06-15 NOTE — Patient Instructions (Signed)
Medication Instructions:  Your physician recommends that you continue on your current medications as directed. Please refer to the Current Medication list given to you today.  *If you need a refill on your cardiac medications before your next appointment, please call your pharmacy*   Lab Work: Your physician recommends that you return for lab work in: 2 Months   If you have labs (blood work) drawn today and your tests are completely normal, you will receive your results only by: [place] (if you have MyChart) OR A paper copy in the mail If you have any lab test that is abnormal or we need to change your treatment, we will call you to review the results.   Testing/Procedures: NONE    Follow-Up: At [facility], you and your health needs are our priority.  As part of our continuing mission to provide you with exceptional heart care, we have created designated Provider Care Teams.  These Care Teams include your primary Cardiologist (physician) and Advanced Practice Providers (APPs -  Physician Assistants and Nurse Practitioners) who all work together to provide you with the care you need, when you need it.  We recommend signing up for the patient portal called "MyChart".  Sign up information is provided on this After Visit Summary.  MyChart is used to connect with patients for Virtual Visits (Telemedicine).  Patients are able to view lab/test results, encounter notes, upcoming appointments, etc.  Non-urgent messages can be sent to your provider as well.   To learn more about what you can do with MyChart, go to [email or web address].    Your next appointment:   6 month(s)  The format for your next appointment:   In Person  Provider:   Carlyle Dolly, MD   Other Instructions Thank you for choosing [facility]!

## 2021-07-08 ENCOUNTER — Ambulatory Visit (HOSPITAL_COMMUNITY)
Admission: RE | Admit: 2021-07-08 | Discharge: 2021-07-08 | Disposition: A | Discharge status: Home / Self Care | Payer: Medicare Other | Attending: Internal Medicine | Admitting: Internal Medicine

## 2021-07-08 ENCOUNTER — Encounter (HOSPITAL_COMMUNITY): Payer: Self-pay | Admitting: Internal Medicine

## 2021-07-08 ENCOUNTER — Encounter (HOSPITAL_COMMUNITY)
Admission: RE | Disposition: A | Discharge status: Home / Self Care | Payer: Self-pay | Source: Home / Self Care | Attending: Internal Medicine

## 2021-07-08 ENCOUNTER — Other Ambulatory Visit: Payer: Self-pay

## 2021-07-08 DIAGNOSIS — Z7901 Long term (current) use of anticoagulants: Secondary | ICD-10-CM | POA: Insufficient documentation

## 2021-07-08 DIAGNOSIS — Z79899 Other long term (current) drug therapy: Secondary | ICD-10-CM | POA: Insufficient documentation

## 2021-07-08 DIAGNOSIS — Z1211 Encounter for screening for malignant neoplasm of colon: Secondary | ICD-10-CM

## 2021-07-08 DIAGNOSIS — K644 Residual hemorrhoidal skin tags: Secondary | ICD-10-CM | POA: Insufficient documentation

## 2021-07-08 DIAGNOSIS — K649 Unspecified hemorrhoids: Secondary | ICD-10-CM | POA: Diagnosis not present

## 2021-07-08 DIAGNOSIS — K5730 Diverticulosis of large intestine without perforation or abscess without bleeding: Secondary | ICD-10-CM | POA: Insufficient documentation

## 2021-07-08 DIAGNOSIS — Z87891 Personal history of nicotine dependence: Secondary | ICD-10-CM | POA: Insufficient documentation

## 2021-07-08 HISTORY — PX: COLONOSCOPY: SHX5424

## 2021-07-08 HISTORY — DX: Cardiac arrhythmia, unspecified: I49.9

## 2021-07-08 SURGERY — COLONOSCOPY
Anesthesia: Moderate Sedation

## 2021-07-08 MED ORDER — STERILE WATER FOR IRRIGATION IR SOLN
Status: DC | PRN
  Administered 2021-07-08: 200 mL

## 2021-07-08 MED ORDER — SODIUM CHLORIDE 0.9 % IV SOLN: INTRAVENOUS | Status: DC

## 2021-07-08 MED ORDER — MIDAZOLAM HCL 5 MG/5ML IJ SOLN
INTRAMUSCULAR | Status: AC
  Filled 2021-07-08: qty 10

## 2021-07-08 MED ORDER — MIDAZOLAM HCL 5 MG/5ML IJ SOLN
INTRAMUSCULAR | Status: DC | PRN
  Administered 2021-07-08: 2 mg via INTRAVENOUS
  Administered 2021-07-08: 1 mg via INTRAVENOUS

## 2021-07-08 MED ORDER — MEPERIDINE HCL 50 MG/ML IJ SOLN
INTRAMUSCULAR | Status: DC | PRN
  Administered 2021-07-08: 25 mg via INTRAVENOUS
  Administered 2021-07-08: 10 mg via INTRAVENOUS
  Administered 2021-07-08: 15 mg via INTRAVENOUS

## 2021-07-08 MED ORDER — MEPERIDINE HCL 50 MG/ML IJ SOLN
INTRAMUSCULAR | Status: AC
  Filled 2021-07-08: qty 1

## 2021-07-08 NOTE — Op Note (Signed)
[facility] Patient Name: Pamela Yang Procedure Date: 07/08/2021 10:55 AM MRN: [id number] Date of Birth: 1951-11-26 Attending MD: Hildred Laser , MD CSN: [id number] Age: 70 Admit Type: Outpatient Procedure:                Colonoscopy Indications:              Screening for colorectal malignant neoplasm Providers:                Hildred Laser, MD, Lambert Mody, Nelma Rothman,                            Technician Referring MD:             Elayne Snare Wolfgang Phoenix, MD Medicines:                Meperidine 50 mg IV, Midazolam 3 mg IV Complications:            No immediate complications. Estimated Blood Loss:     Estimated blood loss: none. Procedure:                Pre-Anesthesia Assessment:                           - Prior to the procedure, a History and Physical                            was performed, and patient medications and                            allergies were reviewed. The patient's tolerance of                            previous anesthesia was also reviewed. The risks                            and benefits of the procedure and the sedation                            options and risks were discussed with the patient.                            All questions were answered, and informed consent                            was obtained. Prior Anticoagulants: The patient                            last took Xarelto (rivaroxaban) 3 days prior to the                            procedure. ASA Grade Assessment: III - A patient                            with severe systemic disease. After reviewing the  risks and benefits, the patient was deemed in                            satisfactory condition to undergo the procedure.                           After obtaining informed consent, the colonoscope                            was passed under direct vision. Throughout the                            procedure, the patient's blood pressure, pulse, and                             oxygen saturations were monitored continuously. The                            [id number]-HQ190L ([id number]) scope was introduced through                            the anus and advanced to the the cecum, identified                            by appendiceal orifice and ileocecal valve. The                            colonoscopy was somewhat difficult due to                            restricted mobility of the colon. The patient                            tolerated the procedure well. The quality of the                            bowel preparation was good. The ileocecal valve,                            appendiceal orifice, and rectum were photographed. Scope In: 11:23:42 AM Scope Out: 11:45:32 AM Scope Withdrawal Time: 0 hours 8 minutes 39 seconds  Total Procedure Duration: 0 hours 21 minutes 50 seconds  Findings:      The perianal and digital rectal examinations were normal.      Multiple small and large-mouthed diverticula were found in the sigmoid       colon.      The exam was otherwise normal throughout the examined colon.      External hemorrhoids were found during retroflexion. The hemorrhoids       were small. Impression:               - Diverticulosis in the sigmoid colon.                           - External hemorrhoids.                           -  No specimens collected. Moderate Sedation:      Moderate (conscious) sedation was administered by the endoscopy nurse       and supervised by the endoscopist. The following parameters were       monitored: oxygen saturation, heart rate, blood pressure, CO2       capnography and response to care. Total physician intraservice time was       25 minutes. Recommendation:           - Patient has a contact number available for                            emergencies. The signs and symptoms of potential                            delayed complications were discussed with the                            patient. Return to  normal activities tomorrow.                            Written discharge instructions were provided to the                            patient.                           - High fiber diet today.                           - Continue present medications.                           - Resume Xarelto (rivaroxaban) at prior dose today.                           - No repeat colonoscopy due to age and the absence                            of advanced adenomas. Procedure Code(s):        --- Professional ---                           [phone], Colonoscopy, flexible; diagnostic, including                            collection of specimen(s) by brushing or washing,                            when performed (separate procedure)                           99153, Moderate sedation; each additional 15                            minutes intraservice time  G0500, Moderate sedation services provided by the                            same physician or other qualified health care                            professional performing a gastrointestinal                            endoscopic service that sedation supports,                            requiring the presence of an independent trained                            observer to assist in the monitoring of the                            patient's level of consciousness and physiological                            status; initial 15 minutes of intra-service time;                            patient age 31 years or older (additional time may                            be reported with [phone], as appropriate) Diagnosis Code(s):        --- Professional ---                           Z12.11, Encounter for screening for malignant                            neoplasm of colon                           K64.4, Residual hemorrhoidal skin tags                           K57.30, Diverticulosis of large intestine without                             perforation or abscess without bleeding CPT copyright 2019 American [employer] Association. All rights reserved. The codes documented in this report are preliminary and upon coder review may  be revised to meet current compliance requirements. Hildred Laser, MD Hildred Laser, MD 07/08/2021 11:53:19 AM This report has been signed electronically. Number of Addenda: 0

## 2021-07-08 NOTE — Discharge Instructions (Addendum)
Resume usual medications including Xarelto at usual dose. High-fiber diet. No driving for 24 hours. Normal screening colonoscopies.

## 2021-07-08 NOTE — H&P (Signed)
Pamela Yang is an 70 y.o. female.   Chief Complaint: Patient is here for colonoscopy HPI: Patient is 70 year old Caucasian female who is here for screening colonoscopy.  She denies abdominal pain change in bowel habits or rectal bleeding.  She has lost some weight on account of her husband's illness who passed away last year.  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 She has been off Xarelto for 2 days. Last colonoscopy in June 2012 revealed few sigmoid diverticula and external hemorrhoids. Family history is negative for CRC.  Past Medical History:  Diagnosis Date   Atrial fibrillation (HCC)    Dysrhythmia    GAD (generalized anxiety disorder)    Gastroparesis 04/2007   Dr. Stann Mainland   GERD (gastroesophageal reflux disease)    Hearing loss    Osteoporosis     Past Surgical History:  Procedure Laterality Date   COLONOSCOPY  05/20/2011   Dr Laural Golden   TONSILLECTOMY      Family History  Problem Relation Age of Onset   Heart attack Mother    Ovarian cancer Sister    Social History:  reports that she quit smoking about 41 years ago. Her smoking use included cigarettes. She has never used smokeless tobacco. She reports current alcohol use. She reports that she does not use drugs.  Allergies: No Known Allergies  Medications Prior to Admission  Medication Sig Dispense Refill   Ascorbic Acid (VITAMIN C PO) Take 1,000 mg by mouth daily.     Flaxseed, Linseed, (FLAXSEED OIL) 1200 MG CAPS Take 1,200 mg by mouth 2 (two) times daily.     flecainide (TAMBOCOR) 50 MG tablet TAKE ONE TABLET BY MOUTH TWICE A DAY (Patient taking differently: Take 50 mg by mouth 2 (two) times daily.) 60 tablet 1   Multiple Vitamins-Minerals (MULTIVITAMIN WITH MINERALS) tablet Take 1 tablet by mouth daily.     Propylene Glycol (SYSTANE BALANCE) 0.6 % SOLN Place 2 drops into both eyes in the morning and at bedtime. Into both eyes     rivaroxaban (XARELTO) 20 MG TABS tablet Take 1 tablet (20 mg total) by mouth daily with  supper. 30 tablet 11   sertraline (ZOLOFT) 50 MG tablet TAKE ONE (1) TABLET BY MOUTH EVERY DAY (Patient taking differently: Take 50 mg by mouth daily.) 30 tablet 6   diltiazem (CARDIZEM) 30 MG tablet Take 1 tablet (30 mg total) by mouth as needed (atrial fibrillation). 30 tablet 1   polyethylene glycol-electrolytes (TRILYTE) 420 g solution Take 4,000 mLs by mouth as directed. 4000 mL 0    No results found for this or any previous visit (from the past 48 hour(s)). No results found.  Review of Systems  Blood pressure 122/65, temperature 97.8 F (36.6 C), temperature source Oral, resp. rate 15, height 5\' 4"  (1.626 m), weight 45.4 kg, SpO2 100 %. Physical Exam HENT:     Mouth/Throat:     Mouth: Mucous membranes are moist.     Pharynx: Oropharynx is clear.  Eyes:     General: No scleral icterus.    Conjunctiva/sclera: Conjunctivae normal.  Cardiovascular:     Rate and Rhythm: Normal rate and regular rhythm.     Heart sounds: Normal heart sounds. No murmur heard. Pulmonary:     Effort: Pulmonary effort is normal.     Breath sounds: Normal breath sounds.  Abdominal:     Comments: Abdomen is flat.  On palpation is soft.  Abdominal aorta is easily palpable but not expanded.  No organomegaly or  masses.  Musculoskeletal:     Cervical back: Neck supple.  Lymphadenopathy:     Cervical: No cervical adenopathy.  Skin:    General: Skin is warm and dry.  Neurological:     Mental Status: She is alert.     Assessment/Plan  Average risk screening colonoscopy  Hildred Laser, MD 07/08/2021, 11:13 AM

## 2021-07-14 ENCOUNTER — Encounter (HOSPITAL_COMMUNITY): Payer: Self-pay | Admitting: Internal Medicine

## 2021-07-28 ENCOUNTER — Other Ambulatory Visit: Payer: Self-pay

## 2021-07-28 ENCOUNTER — Ambulatory Visit
Admission: EM | Admit: 2021-07-28 | Discharge: 2021-07-28 | Disposition: A | Discharge status: Home / Self Care | Payer: Medicare Other | Attending: Family Medicine | Admitting: Family Medicine

## 2021-07-28 ENCOUNTER — Encounter: Payer: Self-pay | Admitting: Emergency Medicine

## 2021-07-28 DIAGNOSIS — S51811A Laceration without foreign body of right forearm, initial encounter: Secondary | ICD-10-CM

## 2021-07-28 DIAGNOSIS — R55 Syncope and collapse: Secondary | ICD-10-CM | POA: Diagnosis not present

## 2021-07-28 NOTE — ED Provider Notes (Signed)
  [place]   [id number] 07/28/21 Arrival Time: 1109  ASSESSMENT & PLAN:  1. Laceration of right forearm, initial encounter   2. Vasovagal episode    Procedure: Verbal consent obtained. Patient provided with risks and alternatives to the procedure. Wound copiously irrigated with NS then cleansed with betadine. Local anesthesia: Lidocaine 1% with epinephrine. Wound carefully explored. No foreign body, tendon injury, or nonviable tissue were noted. Using sterile technique, 4 interrupted 4-0 Prolene sutures were placed to reapproximate the wound. Procedure tolerated well. No complications. Minimal bleeding. Advised to look for and return for any signs of infection such as redness, swelling, discharge, or worsening pain. Return for suture removal in 7 days.  Vasovagal episode before procedure. BP stable. HR 43 but she reports this is her resting heart rate. Feeling much better before discharge. Stable.  Reviewed expectations re: course of current medical issues. Questions answered. Outlined signs and symptoms indicating need for more acute intervention. Patient verbalized understanding. After Visit Summary given.   SUBJECTIVE:  Pamela Yang is a 70 y.o. female who presents with a laceration of her R forearm; today. Mod bleeding; controlled. No extremity sensation changes or weakness.  Td UTD: Yes.  OBJECTIVE:  Vitals:   07/28/21 1145 07/28/21 1248  BP: 110/63 98/60  Pulse: 60 (!) 48  Resp: 17 17  Temp: 98.6 F (37 C)   TempSrc: Temporal   SpO2: 98%     General appearance: alert; no distress Skin: linear laceration of R forearm; size: approx 2 cm; clean wound edges, no foreign bodies; without active bleeding Psychological: alert and cooperative; normal mood and affect   No Known Allergies  Past Medical History:  Diagnosis Date   Atrial fibrillation (HCC)    Dysrhythmia    GAD (generalized anxiety disorder)    Gastroparesis 04/2007   Dr. Stann Mainland   GERD  (gastroesophageal reflux disease)    Hearing loss    Osteoporosis    Social History   Socioeconomic History   Marital status: Widowed    Spouse name: Not on file   Number of children: Not on file   Years of education: Not on file   Highest education level: Not on file  Occupational History   Not on file  Tobacco Use   Smoking status: Former    Types: Cigarettes    Quit date: 12/20/1979    Years since quitting: 41.6   Smokeless tobacco: Never  Vaping Use   Vaping Use: Never used  Substance and Sexual Activity   Alcohol use: Yes    Comment: occ   Drug use: Never   Sexual activity: Yes    Birth control/protection: Post-menopausal  Other Topics Concern   Not on file  Social History Narrative   Not on file   Social Determinants of Health   Financial Resource Strain: Not on file  Food Insecurity: Not on file  Transportation Needs: Not on file  Physical Activity: Not on file  Stress: Not on file  Social Connections: Not on file          [place], MD 07/28/21 1303

## 2021-07-28 NOTE — ED Triage Notes (Signed)
Laceration to RT forearm from handle of mug

## 2021-08-04 ENCOUNTER — Ambulatory Visit
Admission: EM | Admit: 2021-08-04 | Discharge: 2021-08-04 | Disposition: A | Discharge status: Home / Self Care | Payer: Medicare Other

## 2021-08-04 ENCOUNTER — Other Ambulatory Visit: Payer: Self-pay

## 2021-08-04 NOTE — ED Triage Notes (Signed)
Pt here for suture removal, 4 stiches removed, skin approximated well

## 2021-08-16 ENCOUNTER — Other Ambulatory Visit: Payer: Self-pay | Admitting: Cardiology

## 2021-08-16 DIAGNOSIS — Z79899 Other long term (current) drug therapy: Secondary | ICD-10-CM | POA: Diagnosis not present

## 2021-08-17 ENCOUNTER — Other Ambulatory Visit: Payer: Self-pay

## 2021-08-17 ENCOUNTER — Telehealth: Payer: Self-pay

## 2021-08-17 LAB — BASIC METABOLIC PANEL
BUN/Creatinine Ratio: 36 — ABNORMAL HIGH (ref 12–28)
BUN: 32 mg/dL — ABNORMAL HIGH (ref 8–27)
CO2: 23 mmol/L (ref 20–29)
Calcium: 10.3 mg/dL (ref 8.7–10.3)
Chloride: 101 mmol/L (ref 96–106)
Creatinine, Ser: 0.9 mg/dL (ref 0.57–1.00)
Glucose: 112 mg/dL — ABNORMAL HIGH (ref 65–99)
Potassium: 4.2 mmol/L (ref 3.5–5.2)
Sodium: 140 mmol/L (ref 134–144)
eGFR: 68 mL/min/{1.73_m2} (ref >59)

## 2021-08-17 MED ORDER — RIVAROXABAN 15 MG PO TABS: 15.0000 mg | ORAL_TABLET | Freq: Two times a day (BID) | ORAL | 6 refills | Status: DC

## 2021-08-17 MED ORDER — RIVAROXABAN 15 MG PO TABS: 15.0000 mg | ORAL_TABLET | Freq: Every day | ORAL | 6 refills | Status: DC

## 2021-08-17 NOTE — Telephone Encounter (Signed)
Patient notified of result notes. Pt understood she would be starting 15 mg Xarelto daily after finishing 20 mg tablets. PCP copied, new rx sent in.

## 2021-08-17 NOTE — Telephone Encounter (Signed)
-----   Message from Erma Heritage, [place] sent at 08/17/2021 12:48 PM EDT ----- Please let the patient know that her kidney function has improved when compared to prior labs in 04/2021 and electrolytes remain within a normal range. When calculating her creatinine clearance based off her labs, age and weight, this is now at 42 mL/min meaning that her Xarelto dosing should be reduced to 15 mg daily. She can finish out her current bottle of 20 mg daily tablets but would then reduce dosing to 15 mg daily and she will need a new Rx sent in for this.

## 2021-08-26 DIAGNOSIS — M810 Age-related osteoporosis without current pathological fracture: Secondary | ICD-10-CM | POA: Diagnosis not present

## 2021-09-02 DIAGNOSIS — M810 Age-related osteoporosis without current pathological fracture: Secondary | ICD-10-CM | POA: Diagnosis not present

## 2021-09-16 DIAGNOSIS — Z23 Encounter for immunization: Secondary | ICD-10-CM | POA: Diagnosis not present

## 2021-10-07 ENCOUNTER — Encounter (INDEPENDENT_AMBULATORY_CARE_PROVIDER_SITE_OTHER): Payer: Self-pay | Admitting: Gastroenterology

## 2021-10-07 ENCOUNTER — Ambulatory Visit (INDEPENDENT_AMBULATORY_CARE_PROVIDER_SITE_OTHER): Payer: Medicare Other | Admitting: Gastroenterology

## 2021-10-07 ENCOUNTER — Other Ambulatory Visit: Payer: Self-pay

## 2021-10-07 DIAGNOSIS — R7989 Other specified abnormal findings of blood chemistry: Secondary | ICD-10-CM | POA: Diagnosis not present

## 2021-10-07 NOTE — Patient Instructions (Signed)
Perform blood workup  

## 2021-10-07 NOTE — Progress Notes (Signed)
Maylon Peppers, M.D. Gastroenterology & Hepatology [facility] [address] [place], [place] [address] Primary Care Physician: Kathyrn Drown, [place] [address]  Referring MD: PCP  Chief Complaint: Elevated aminotransferases  History of Present Illness: Pamela Yang is a 70 y.o. female with past medical history of atrial fibrillation, generalized anxiety disorder, ?gastroparesis, GERD, who presents for evaluation of elevated aminotransferases.  The patient was referred by her PCP to our practice as she was found to have mild elevation in her aminotransferases during routine blood work-up.  The patient denies having any complaints and states feeling well.  The patient denies having any nausea, vomiting, fever, chills, hematochezia, melena, hematemesis, abdominal distention, abdominal pain, diarrhea, jaundice, pruritus or weight loss.  Denies any infections in the last 6 months.  States she started flecainide and Xarelto in 05/2020. Very seldom takes OTC medication for pain, most of the times Tylenol. No OTC supplements or herbs.  Upon review of her medical chart, the patient was found to have mild elevation of her AST and ALT on 08/22/2015 with AST of 35 and ALT of 37 but this normalized (<25) in 2017.  However, she was noted to have elevation of the aminotransferases back in 11/16/2020 with AST of 30 and ALT of 33, repeat blood testing 05/05/2021 showed the exact same numbers.  Notably, her total bilirubin was normal 0.2 and alkaline phosphatase was 91, rest of CMP was within normal limits.  Other work-up includes normal iron of 96, TIBC of 304, iron saturation of 32% and ferritin was slightly elevated 252.  Had a negative acute hepatitis panel.  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  Last Colonoscopy:2022  - Diverticulosis in the sigmoid colon. -  External hemorrhoids.  FHx: neg for any gastrointestinal/liver disease, sister uterine cancer Social: neg smoking, or illicit drug use. Drinks a glass of wine once a week. Surgical: no abdominal surgeries  Past Medical History: Past Medical History:  Diagnosis Date   Atrial fibrillation ([place])    Dysrhythmia    GAD (generalized anxiety disorder)    Gastroparesis 04/2007   Dr. Stann Mainland   GERD (gastroesophageal reflux disease)    Hearing loss    Osteoporosis     Past Surgical History: Past Surgical History:  Procedure Laterality Date   COLONOSCOPY  05/20/2011   Dr Laural Golden   COLONOSCOPY N/A 07/08/2021   Procedure: COLONOSCOPY;  Surgeon: Rogene Houston, MD;  Location: [facility];  Service: Endoscopy;  Laterality: N/A;  10:55   TONSILLECTOMY      Family History: Family History  Problem Relation Age of Onset   Heart attack Mother    Ovarian cancer Sister     Social History: Social History   Tobacco Use  Smoking Status Former   Types: Cigarettes   Quit date: 12/20/1979   Years since quitting: 41.8  Smokeless Tobacco Never   Social History   Substance and Sexual Activity  Alcohol Use Yes   Comment: occ   Social History   Substance and Sexual Activity  Drug Use Never    Allergies: No Known Allergies  Medications: Current Outpatient Medications  Medication Sig Dispense Refill   Ascorbic Acid (VITAMIN C PO) Take 1,000 mg by mouth daily.     denosumab (PROLIA) 60 MG/ML SOSY injection Inject 60 mg into the skin every 6 (six) months.     diltiazem (CARDIZEM) 30 MG tablet Take  1 tablet (30 mg total) by mouth as needed (atrial fibrillation). 30 tablet 1   Flaxseed, Linseed, (FLAXSEED OIL) 1200 MG CAPS Take 1,200 mg by mouth 2 (two) times daily.     flecainide (TAMBOCOR) 50 MG tablet TAKE ONE TABLET BY MOUTH TWICE A DAY 60 tablet 5   Multiple Vitamins-Minerals (MULTIVITAMIN WITH MINERALS) tablet Take 1 tablet by mouth daily.     Propylene Glycol (SYSTANE BALANCE) 0.6  % SOLN Place 2 drops into both eyes in the morning and at bedtime. Into both eyes     Rivaroxaban (XARELTO) 15 MG TABS tablet Take 1 tablet (15 mg total) by mouth daily. 30 tablet 6   sertraline (ZOLOFT) 50 MG tablet TAKE ONE (1) TABLET BY MOUTH EVERY DAY 30 tablet 6   No current facility-administered medications for this visit.    Review of Systems: GENERAL: negative for malaise, night sweats HEENT: No changes in hearing or vision, no nose bleeds or other nasal problems. NECK: Negative for lumps, goiter, pain and significant neck swelling RESPIRATORY: Negative for cough, wheezing CARDIOVASCULAR: Negative for chest pain, leg swelling, palpitations, orthopnea GI: SEE HPI MUSCULOSKELETAL: Negative for joint pain or swelling, back pain, and muscle pain. SKIN: Negative for lesions, rash PSYCH: Negative for sleep disturbance, mood disorder and recent psychosocial stressors. HEMATOLOGY Negative for prolonged bleeding, bruising easily, and swollen nodes. ENDOCRINE: Negative for cold or heat intolerance, polyuria, polydipsia and goiter. NEURO: negative for tremor, gait imbalance, syncope and seizures. The remainder of the review of systems is noncontributory.   Physical Exam: BP 113/73 (BP Location: Left Arm, Patient Position: Sitting, Cuff Size: Small)   Pulse (!) 54   Temp (!) 97.4 F (36.3 C) (Oral)   Ht 5' 3.25" (1.607 m)   Wt 108 lb 9.6 oz (49.3 kg)   BMI 19.09 kg/m  GENERAL: The patient is AO x3, in no acute distress. HEENT: Head is normocephalic and atraumatic. EOMI are intact. Mouth is well hydrated and without lesions. NECK: Supple. No masses LUNGS: Clear to auscultation. No presence of rhonchi/wheezing/rales. Adequate chest expansion HEART: RRR, normal s1 and s2. ABDOMEN: Soft, nontender, no guarding, no peritoneal signs, and nondistended. BS +. No masses. EXTREMITIES: Without any cyanosis, clubbing, rash, lesions or edema. NEUROLOGIC: AOx3, no focal motor deficit. SKIN: no  jaundice, no rashes   Imaging/Labs: as above  I personally reviewed and interpreted the available labs, imaging and endoscopic files.  Impression and Plan: Pamela Yang is a 70 y.o. female with past medical history of atrial fibrillation, generalized anxiety disorder, ?gastroparesis, GERD, who presents for evaluation of elevated aminotransferases.  The patient has been asymptomatic but had mild borderline elevation of her aminotransferases.  Blood testing has been negative for viral Hepatitis and hemochromatosis, less likely due to fatty liver given imaging findings.  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  Flecainide can also cause mild transient elevation of the liver enzymes but is not associated with significant DILI.  I reassured to the patient that her ALT/AST elevation is very mild and can be monitored clinically, we will check for autoimmune hepatitis, rhabdomyolysis, alpha 1 antitrypsin deficiency and a repeat CMP today.  The patient understood and agreed.  - Check CMP, ANA, ASMA, IgG, CK and A1AT - May consider proceeding with TTE suggested by PCP  All questions were answered.      Maylon Peppers, MD Gastroenterology and Hepatology [facility]

## 2021-10-20 LAB — COMPREHENSIVE METABOLIC PANEL
AG Ratio: 1.7 (calc) (ref 1.0–2.5)
ALT: 25 U/L (ref 6–29)
AST: 27 U/L (ref 10–35)
Albumin: 4.3 g/dL (ref 3.6–5.1)
Alkaline phosphatase (APISO): 62 U/L (ref 37–153)
BUN: 21 mg/dL (ref 7–25)
CO2: 30 mmol/L (ref 20–32)
Calcium: 10.4 mg/dL (ref 8.6–10.4)
Chloride: 103 mmol/L (ref 98–110)
Creat: 0.86 mg/dL (ref 0.60–1.00)
Globulin: 2.5 g/dL (calc) (ref 1.9–3.7)
Glucose, Bld: 86 mg/dL (ref 65–99)
Potassium: 4.5 mmol/L (ref 3.5–5.3)
Sodium: 140 mmol/L (ref 135–146)
Total Bilirubin: 0.8 mg/dL (ref 0.2–1.2)
Total Protein: 6.8 g/dL (ref 6.1–8.1)

## 2021-10-20 LAB — ANA: Anti Nuclear Antibody (ANA): POSITIVE — AB

## 2021-10-20 LAB — ANTI-NUCLEAR AB-TITER (ANA TITER): ANA Titer 1: 1:40 {titer} — ABNORMAL HIGH

## 2021-10-20 LAB — ANTI-SMOOTH MUSCLE ANTIBODY, IGG: Actin (Smooth Muscle) Antibody (IGG): <20 U (ref <20)

## 2021-10-20 LAB — ALPHA-1 ANTITRYPSIN PHENOTYPE: A-1 Antitrypsin, Ser: 110 mg/dL (ref 83–199)

## 2021-10-20 LAB — IGG: IgG (Immunoglobin G), Serum: 1011 mg/dL (ref 600–1540)

## 2021-10-21 ENCOUNTER — Encounter (INDEPENDENT_AMBULATORY_CARE_PROVIDER_SITE_OTHER): Payer: Self-pay

## 2021-12-03 DIAGNOSIS — M810 Age-related osteoporosis without current pathological fracture: Secondary | ICD-10-CM | POA: Diagnosis not present

## 2021-12-20 DIAGNOSIS — H04123 Dry eye syndrome of bilateral lacrimal glands: Secondary | ICD-10-CM | POA: Diagnosis not present

## 2021-12-20 DIAGNOSIS — Z961 Presence of intraocular lens: Secondary | ICD-10-CM | POA: Diagnosis not present

## 2021-12-20 DIAGNOSIS — H43813 Vitreous degeneration, bilateral: Secondary | ICD-10-CM | POA: Diagnosis not present

## 2021-12-21 ENCOUNTER — Ambulatory Visit (INDEPENDENT_AMBULATORY_CARE_PROVIDER_SITE_OTHER): Payer: Medicare Other

## 2021-12-21 ENCOUNTER — Other Ambulatory Visit: Payer: Self-pay

## 2021-12-21 VITALS — BP 110/64 | HR 81 | Ht 63.0 in | Wt 107.0 lb

## 2021-12-21 DIAGNOSIS — M810 Age-related osteoporosis without current pathological fracture: Secondary | ICD-10-CM | POA: Insufficient documentation

## 2021-12-21 DIAGNOSIS — Z0000 Encounter for general adult medical examination without abnormal findings: Secondary | ICD-10-CM

## 2021-12-21 NOTE — Progress Notes (Signed)
Subjective:   Pamela Yang is a 71 y.o. female who presents for Medicare Annual (Subsequent) preventive examination.  Review of Systems     Cardiac Risk Factors include: advanced age (>66men, >39 women);Other (see comment);dyslipidemia, Risk factor comments: Osteoporosis    IN OFFICE VISIT AT [facility]. Objective:    Today's Vitals   12/21/21 1022  BP: 110/64  Pulse: 81  Weight: 107 lb (48.5 kg)  Height: 5\' 3"  (1.6 m)   Body mass index is 18.95 kg/m.  Advanced Directives 12/21/2021 07/08/2021 05/22/2020 03/09/2020 03/07/2016  Does Patient Have a Medical Advance Directive? Yes Yes Yes No;Yes Yes  Type of Paramedic of [place];Living will [place];Living will Living will Healthcare Power of [place] in Chart? No - copy requested - - - Yes    Current Medications (verified) Outpatient Encounter Medications as of 12/21/2021  Medication Sig   Ascorbic Acid (VITAMIN C PO) Take 1,000 mg by mouth daily.   denosumab (PROLIA) 60 MG/ML SOSY injection Inject 60 mg into the skin every 6 (six) months.   diltiazem (CARDIZEM) 30 MG tablet Take 1 tablet (30 mg total) by mouth as needed (atrial fibrillation).   Flaxseed, Linseed, (FLAXSEED OIL) 1200 MG CAPS Take 1,200 mg by mouth 2 (two) times daily.   flecainide (TAMBOCOR) 50 MG tablet TAKE ONE TABLET BY MOUTH TWICE A DAY   Multiple Vitamins-Minerals (MULTIVITAMIN WITH MINERALS) tablet Take 1 tablet by mouth daily.   Propylene Glycol (SYSTANE BALANCE) 0.6 % SOLN Place 2 drops into both eyes in the morning and at bedtime. Into both eyes   Rivaroxaban (XARELTO) 15 MG TABS tablet Take 1 tablet (15 mg total) by mouth daily.   sertraline (ZOLOFT) 50 MG tablet TAKE ONE (1) TABLET BY MOUTH EVERY DAY   No facility-administered encounter medications on file as of 12/21/2021.    Allergies (verified) Patient has no known allergies.   History: Past  Medical History:  Diagnosis Date   Atrial fibrillation (HCC)    Dysrhythmia    GAD (generalized anxiety disorder)    Gastroparesis 04/2007   Dr. Stann Mainland   GERD (gastroesophageal reflux disease)    Hearing loss    Osteoporosis    Past Surgical History:  Procedure Laterality Date   COLONOSCOPY  05/20/2011   Dr Laural Golden   COLONOSCOPY N/A 07/08/2021   Procedure: COLONOSCOPY;  Surgeon: Rogene Houston, MD;  Location: [facility];  Service: Endoscopy;  Laterality: N/A;  10:55   TONSILLECTOMY     Family History  Problem Relation Age of Onset   Heart attack Mother    Ovarian cancer Sister    Social History   Socioeconomic History   Marital status: Widowed    Spouse name: Not on file   Number of children: 2   Years of education: Not on file   Highest education level: Not on file  Occupational History   Not on file  Tobacco Use   Smoking status: Former    Types: Cigarettes    Quit date: 12/20/1979    Years since quitting: 42.0   Smokeless tobacco: Never  Vaping Use   Vaping Use: Never used  Substance and Sexual Activity   Alcohol use: Yes    Comment: occ   Drug use: Never   Sexual activity: Yes    Birth control/protection: Post-menopausal  Other Topics Concern   Not on file  Social History Narrative  1 daughter living. 1 daughter died at age 45.   No grandchildren.   Husband passed 06/2021. Married x 24 years.   Social Determinants of Health   Financial Resource Strain: Low Risk    Difficulty of Paying Living Expenses: Not hard at all  Food Insecurity: No Food Insecurity   Worried About Charity fundraiser in the Last Year: Never true   [place] in the Last Year: Never true  Transportation Needs: No Transportation Needs   Lack of Transportation (Medical): No   Lack of Transportation (Non-Medical): No  Physical Activity: Sufficiently Active   Days of Exercise per Week: 6 days   Minutes of Exercise per Session: 50 min  Stress: No Stress Concern Present    Feeling of Stress : Only a little  Social Connections: Moderately Integrated   Frequency of Communication with Friends and Family: More than three times a week   Frequency of Social Gatherings with Friends and Family: Three times a week   Attends Religious Services: More than 4 times per year   Active Member of Clubs or Organizations: Yes   Attends Archivist Meetings: More than 4 times per year   Marital Status: Widowed    Tobacco Counseling Counseling given: Not Answered   Clinical Intake:  Pre-visit preparation completed: Yes  Pain : No/denies pain     Nutritional Status: BMI <19  Underweight Nutritional Risks: None Diabetes: No  How often do you need to have someone help you when you read instructions, pamphlets, or other written materials from your doctor or pharmacy?: 1 - Never  Diabetic?NO  Interpreter Needed?: No  Information entered by :: MJ Logan Vegh, LPN   Activities of Daily Living In your present state of health, do you have any difficulty performing the following activities: 12/21/2021 12/20/2021  Hearing? 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 -  Vision? N N  Difficulty concentrating or making decisions? N N  Walking or climbing stairs? N N  Dressing or bathing? N N  Doing errands, shopping? N N  Preparing Food and eating ? N N  Using the Toilet? N N  In the past six months, have you accidently leaked urine? N N  Do you have problems with loss of bowel control? N N  Managing your Medications? N N  Managing your Finances? N N  Housekeeping or managing your Housekeeping? N N  Some recent data might be hidden    Patient Care Team: Kathyrn Drown, MD as PCP - General (Family Medicine) Branch, Alphonse Guild, MD as PCP - Cardiology (Cardiology) Lavonna Monarch, MD as Consulting Physician (Dermatology) Kathyrn Drown, MD as Consulting Physician (Family Medicine)  Indicate any recent Medical Services you may have received from other than [facility] providers in  the past year (date may be approximate).     Assessment:   This is a routine wellness examination for Pamela Yang.  Hearing/Vision screen Hearing Screening - Comments:: Hearing aid in R ear. Deaf in L ear.  Vision Screening - Comments:: Glasses. Dr. Katy Fitch. 12/20/2021.  Dietary issues and exercise activities discussed: Current Exercise Habits: Structured exercise class, Type of exercise: Other - see comments (Does 2 different exercise classes at [facility] Time (Minutes): 50, Frequency (Times/Week): 3, Weekly Exercise (Minutes/Week): 150, Intensity: Moderate, Exercise limited by: cardiac condition(s)   Goals Addressed             This Visit's Progress    DIET - INCREASE WATER INTAKE  Would like to increase water and continue to stay in good health. Is currently going to [facility] and taking exercise class. Would like to travel       Depression Screen PHQ 2/9 Scores 12/21/2021 05/03/2021 12/10/2020 11/24/2020 03/09/2020 09/04/2018 02/28/2018  PHQ - 2 Score 1 0 4 2 2 1 2   PHQ- 9 Score - 2 15 8 9 1 5     Fall Risk Fall Risk  12/21/2021 12/20/2021 05/03/2021 11/24/2020 03/09/2020  Falls in the past year? 1 1 0 0 0  Number falls in past yr: 0 0 0 - -  Injury with Fall? 0 0 0 - -  Risk for fall due to : History of fall(s);Impaired mobility - No Fall Risks - -  Follow up Falls prevention discussed - Falls evaluation completed Falls evaluation completed -    FALL RISK PREVENTION PERTAINING TO THE HOME:  Any stairs in or around the home? Yes  If so, are there any without handrails? No  Home free of loose throw rugs in walkways, pet beds, electrical cords, etc? Yes  Adequate lighting in your home to reduce risk of falls? Yes   ASSISTIVE DEVICES UTILIZED TO PREVENT FALLS:  Life alert? No  Use of a cane, walker or w/c? No  Grab bars in the bathroom? Yes  Shower chair or bench in shower? Yes  Elevated toilet seat or a handicapped toilet? Yes   TIMED UP AND GO:  Was the test performed? Yes .   Length of time to ambulate 10 feet: 8 sec.   Gait steady and fast without use of assistive device  Cognitive Function:     6CIT Screen 12/21/2021  What Year? 0 points  What month? 0 points  What time? 0 points  Count back from 20 0 points  Months in reverse 0 points  Repeat phrase 0 points  Total Score 0    Immunizations Immunization History  Administered Date(s) Administered   Fluad Quad(high Dose 65+) 09/04/2020   Influenza,inj,Quad PF,6+ Mos 08/25/2016, 08/30/2017   Influenza,trivalent, recombinat, inj, PF 09/29/2015   Influenza-Unspecified 12/31/2014, 08/28/2018, 09/25/2019   Moderna Sars-Covid-2 Vaccination 01/03/2020, 01/31/2020, 08/20/2020   Pneumococcal Conjugate-13 08/21/2015   Pneumococcal Polysaccharide-23 08/13/2014   Zoster Recombinat (Shingrix) 09/09/2019, 02/17/2020   Zoster, Live 08/26/2014    TDAP status: Due, Education has been provided regarding the importance of this vaccine. Advised may receive this vaccine at local pharmacy or Health Dept. Aware to provide a copy of the vaccination record if obtained from local pharmacy or Health Dept. Verbalized acceptance and understanding.  Flu Vaccine status: Up to date  Pneumococcal vaccine status: Up to date  Covid-19 vaccine status: Completed vaccines  Qualifies for Shingles Vaccine? Yes   Zostavax completed Yes   Shingrix Completed?: Yes  Screening Tests Health Maintenance  Topic Date Due   COVID-19 Vaccine (4 - Booster for Moderna series) 10/15/2020   INFLUENZA VACCINE  07/19/2021   MAMMOGRAM  02/19/2023   TETANUS/TDAP  08/10/2025   Pneumonia Vaccine 29+ Years old  Completed   DEXA SCAN  Completed   Hepatitis C Screening  Completed   Zoster Vaccines- Shingrix  Completed   HPV VACCINES  Aged Out   COLONOSCOPY (Pts 45-34yrs Insurance coverage will need to be confirmed)  [place] Maintenance Due  Topic Date Due   COVID-19 Vaccine (4 - Booster for Moderna  series) 10/15/2020   INFLUENZA VACCINE  07/19/2021    Colorectal cancer screening: No longer required.  Mammogram status: Completed 02/18/2021. Repeat every year  Bone Density status: Completed 08/26/2021. Results reflect: Bone density results: OSTEOPOROSIS. Repeat every 2 years.  Lung Cancer Screening: (Low Dose CT Chest recommended if Age 46-80 years, 30 pack-year currently smoking OR have quit w/in 15years.) does not qualify.    Additional Screening:  Hepatitis C Screening: does qualify; Completed 06/02/2021  Vision Screening: Recommended annual ophthalmology exams for early detection of glaucoma and other disorders of the eye. Is the patient up to date with their annual eye exam?  Yes  Who is the provider or what is the name of the office in which the patient attends annual eye exams? Dr. Katy Fitch If pt is not established with a provider, would they like to be referred to a provider to establish care? No .   Dental Screening: Recommended annual dental exams for proper oral hygiene  Community Resource Referral / Chronic Care Management: CRR required this visit?  No   CCM required this visit?  No      Plan:     I have personally reviewed and noted the following in the patients chart:   Medical and social history Use of alcohol, tobacco or illicit drugs  Current medications and supplements including opioid prescriptions.  Functional ability and status Nutritional status Physical activity Advanced directives List of other physicians Hospitalizations, surgeries, and ER visits in previous 12 months Vitals Screenings to include cognitive, depression, and falls Referrals and appointments  In addition, I have reviewed and discussed with patient certain preventive protocols, quality metrics, and best practice recommendations. A written personalized care plan for preventive services as well as general preventive health recommendations were provided to patient.     Chriss Driver, LPN   07/23/276   Nurse Notes: Pt up to date on all health maintenance and vaccines. Pt states she has had some issues with depression related to the holidays and loss of Husband in July. Pt states she feels like she is handling things, "okay."

## 2021-12-21 NOTE — Patient Instructions (Signed)
Pamela Yang , Thank you for taking time to come for your Medicare Wellness Visit. I appreciate your ongoing commitment to your health goals. Please review the following plan we discussed and let me know if I can assist you in the future.   Screening recommendations/referrals: Colonoscopy: Done 07/08/2021 No longer wellness.  Mammogram: Done 02/18/2021. Repeat annually  Bone Density: Done 08/26/2021 Repeat every 2 years  Recommended yearly ophthalmology/optometry visit for glaucoma screening and checkup Recommended yearly dental visit for hygiene and checkup  Vaccinations: Influenza vaccine: Done 10/02/2021 Pneumococcal vaccine: Done 08/13/2014 and 08/21/2015  Tdap vaccine: Due Repeat in 10 years  Shingles vaccine: 08/26/2014, 09/09/2019, 02/17/2020 Covid-19: Done 01/03/2020, 01/31/2020, 08/20/2020 and 09/18/2021    Advanced directives: Please bring a copy of your health care power of attorney and living will to the office to be added to your chart at your convenience.   Conditions/risks identified: KEEP UP THE GOOD WORK!!  Next appointment: Follow up in one year for your annual wellness visit 12/27/2022 @ 10:20 am.   Preventive Care 65 Years and Older, Female Preventive care refers to lifestyle choices and visits with your health care provider that can promote health and wellness. What does preventive care include? A yearly physical exam. This is also called an annual well check. Dental exams once or twice a year. Routine eye exams. Ask your health care provider how often you should have your eyes checked. Personal lifestyle choices, including: Daily care of your teeth and gums. Regular physical activity. Eating a healthy diet. Avoiding tobacco and drug use. Limiting alcohol use. Practicing safe sex. Taking low-dose aspirin every day. Taking vitamin and mineral supplements as recommended by your health care provider. What happens during an annual well check? The services and screenings done  by your health care provider during your annual well check will depend on your age, overall health, lifestyle risk factors, and family history of disease. Counseling  Your health care provider may ask you questions about your: Alcohol use. Tobacco use. Drug use. Emotional well-being. Home and relationship well-being. Sexual activity. Eating habits. History of falls. Memory and ability to understand (cognition). Work and work Statistician. Reproductive health. Screening  You may have the following tests or measurements: Height, weight, and BMI. Blood pressure. Lipid and cholesterol levels. These may be checked every 5 years, or more frequently if you are over 64 years old. Skin check. Lung cancer screening. You may have this screening every year starting at age 12 if you have a 30-pack-year history of smoking and currently smoke or have quit within the past 15 years. Fecal occult blood test (FOBT) of the stool. You may have this test every year starting at age 35. Flexible sigmoidoscopy or colonoscopy. You may have a sigmoidoscopy every 5 years or a colonoscopy every 10 years starting at age 59. Hepatitis C blood test. Hepatitis B blood test. Sexually transmitted disease (STD) testing. Diabetes screening. This is done by checking your blood sugar (glucose) after you have not eaten for a while (fasting). You may have this done every 1-3 years. Bone density scan. This is done to screen for osteoporosis. You may have this done starting at age 42. Mammogram. This may be done every 1-2 years. Talk to your health care provider about how often you should have regular mammograms. Talk with your health care provider about your test results, treatment options, and if necessary, the need for more tests. Vaccines  Your health care provider may recommend certain vaccines, such as: Influenza vaccine.  This is recommended every year. Tetanus, diphtheria, and acellular pertussis (Tdap, Td) vaccine. You  may need a Td booster every 10 years. Zoster vaccine. You may need this after age 34. Pneumococcal 13-valent conjugate (PCV13) vaccine. One dose is recommended after age 95. Pneumococcal polysaccharide (PPSV23) vaccine. One dose is recommended after age 10. Talk to your health care provider about which screenings and vaccines you need and how often you need them. This information is not intended to replace advice given to you by your health care provider. Make sure you discuss any questions you have with your health care provider. Document Released: 01/01/2016 Document Revised: 08/24/2016 Document Reviewed: 10/06/2015 Elsevier Interactive Patient Education  2017 [place] Prevention in the Home Falls can cause injuries. They can happen to people of all ages. There are many things you can do to make your home safe and to help prevent falls. What can I do on the outside of my home? Regularly fix the edges of walkways and driveways and fix any cracks. Remove anything that might make you trip as you walk through a door, such as a raised step or threshold. Trim any bushes or trees on the path to your home. Use bright outdoor lighting. Clear any walking paths of anything that might make someone trip, such as rocks or tools. Regularly check to see if handrails are loose or broken. Make sure that both sides of any steps have handrails. Any raised decks and porches should have guardrails on the edges. Have any leaves, snow, or ice cleared regularly. Use sand or salt on walking paths during winter. Clean up any spills in your garage right away. This includes oil or grease spills. What can I do in the bathroom? Use night lights. Install grab bars by the toilet and in the tub and shower. Do not use towel bars as grab bars. Use non-skid mats or decals in the tub or shower. If you need to sit down in the shower, use a plastic, non-slip stool. Keep the floor dry. Clean up any water that spills  on the floor as soon as it happens. Remove soap buildup in the tub or shower regularly. Attach bath mats securely with double-sided non-slip rug tape. Do not have throw rugs and other things on the floor that can make you trip. What can I do in the bedroom? Use night lights. Make sure that you have a light by your bed that is easy to reach. Do not use any sheets or blankets that are too big for your bed. They should not hang down onto the floor. Have a firm chair that has side arms. You can use this for support while you get dressed. Do not have throw rugs and other things on the floor that can make you trip. What can I do in the kitchen? Clean up any spills right away. Avoid walking on wet floors. Keep items that you use a lot in easy-to-reach places. If you need to reach something above you, use a strong step stool that has a grab bar. Keep electrical cords out of the way. Do not use floor polish or wax that makes floors slippery. If you must use wax, use non-skid floor wax. Do not have throw rugs and other things on the floor that can make you trip. What can I do with my stairs? Do not leave any items on the stairs. Make sure that there are handrails on both sides of the stairs and use them. Fix handrails  that are broken or loose. Make sure that handrails are as long as the stairways. Check any carpeting to make sure that it is firmly attached to the stairs. Fix any carpet that is loose or worn. Avoid having throw rugs at the top or bottom of the stairs. If you do have throw rugs, attach them to the floor with carpet tape. Make sure that you have a light switch at the top of the stairs and the bottom of the stairs. If you do not have them, ask someone to add them for you. What else can I do to help prevent falls? Wear shoes that: Do not have high heels. Have rubber bottoms. Are comfortable and fit you well. Are closed at the toe. Do not wear sandals. If you use a stepladder: Make  sure that it is fully opened. Do not climb a closed stepladder. Make sure that both sides of the stepladder are locked into place. Ask someone to hold it for you, if possible. Clearly mark and make sure that you can see: Any grab bars or handrails. First and last steps. Where the edge of each step is. Use tools that help you move around (mobility aids) if they are needed. These include: Canes. Walkers. Scooters. Crutches. Turn on the lights when you go into a dark area. Replace any light bulbs as soon as they burn out. Set up your furniture so you have a clear path. Avoid moving your furniture around. If any of your floors are uneven, fix them. If there are any pets around you, be aware of where they are. Review your medicines with your doctor. Some medicines can make you feel dizzy. This can increase your chance of falling. Ask your doctor what other things that you can do to help prevent falls. This information is not intended to replace advice given to you by your health care provider. Make sure you discuss any questions you have with your health care provider. Document Released: 10/01/2009 Document Revised: 05/12/2016 Document Reviewed: 01/09/2015 Elsevier Interactive Patient Education  2017 [employer].

## 2022-01-04 NOTE — Progress Notes (Addendum)
Cardiology Office Note    Date:  01/05/2022   ID:  Pamela, Yang Apr 06, 1951, MRN [id number]  PCP:  Kathyrn Drown, MD  Cardiologist: Carlyle Dolly, MD    Chief Complaint  Patient presents with   Follow-up    6 month visit    History of Present Illness:    Pamela Yang is a 71 y.o. female with past medical history of paroxysmal atrial fibrillation (on Flecainide) and Osteoporosis who presents to the office today for 50-month follow-up.  She was examined by myself in 05/2021 and reported only 1 episode of dizziness which had improved since her last visit. She denied any recent anginal symptoms and remained active at baseline. She was continued on Flecainide 50 mg twice daily along with PRN Cardizem and Xarelto 20 mg daily with plans for follow-up labs to make sure she remained on the correct dose. Her creatinine clearance based off the labs in 07/2021 was at 42 mL/min, therefore Xarelto was reduced to 15 mg daily.  In talking with the patient today, she reports overall doing well since her last office visit. She continues to participate in exercise classes at the [facility] at least 3 days a week and denies any anginal symptoms with this. No recent palpitations or dizziness. Says that her resting heart rate is typically in the 40's but her heart rate does increase into the 90's with activity. No recent orthopnea, PND or pitting edema.   She reports good compliance with Xarelto and is unaware of any evidence of active bleeding.  Past Medical History:  Diagnosis Date   Atrial fibrillation (HCC)    Dysrhythmia    GAD (generalized anxiety disorder)    Gastroparesis 04/2007   Dr. Stann Mainland   GERD (gastroesophageal reflux disease)    Hearing loss    Osteoporosis     Past Surgical History:  Procedure Laterality Date   COLONOSCOPY  05/20/2011   Dr Laural Golden   COLONOSCOPY N/A 07/08/2021   Procedure: COLONOSCOPY;  Surgeon: Rogene Houston, MD;  Location: [facility];  Service:  Endoscopy;  Laterality: N/A;  10:55   TONSILLECTOMY      Current Medications: Outpatient Medications Prior to Visit  Medication Sig Dispense Refill   Ascorbic Acid (VITAMIN C PO) Take 1,000 mg by mouth daily.     denosumab (PROLIA) 60 MG/ML SOSY injection Inject 60 mg into the skin every 6 (six) months.     diltiazem (CARDIZEM) 30 MG tablet Take 1 tablet (30 mg total) by mouth as needed (atrial fibrillation). 30 tablet 1   Flaxseed, Linseed, (FLAXSEED OIL) 1200 MG CAPS Take 1,200 mg by mouth 2 (two) times daily.     Multiple Vitamins-Minerals (MULTIVITAMIN WITH MINERALS) tablet Take 1 tablet by mouth daily.     Propylene Glycol (SYSTANE BALANCE) 0.6 % SOLN Place 2 drops into both eyes in the morning and at bedtime. Into both eyes     sertraline (ZOLOFT) 50 MG tablet TAKE ONE (1) TABLET BY MOUTH EVERY DAY 30 tablet 6   flecainide (TAMBOCOR) 50 MG tablet TAKE ONE TABLET BY MOUTH TWICE A DAY 60 tablet 5   Rivaroxaban (XARELTO) 15 MG TABS tablet Take 1 tablet (15 mg total) by mouth daily. 30 tablet 6   No facility-administered medications prior to visit.     Allergies:   Patient has no known allergies.   Social History   Socioeconomic History   Marital status: Widowed    Spouse name: Not on file  Number of children: 2   Years of education: Not on file   Highest education level: Not on file  Occupational History   Not on file  Tobacco Use   Smoking status: Former    Types: Cigarettes    Quit date: 12/20/1979    Years since quitting: 42.0   Smokeless tobacco: Never  Vaping Use   Vaping Use: Never used  Substance and Sexual Activity   Alcohol use: Yes    Alcohol/week: 7.0 standard drinks    Types: 7 Glasses of wine per week    Comment: occ   Drug use: Never   Sexual activity: Yes    Birth control/protection: Post-menopausal  Other Topics Concern   Not on file  Social History Narrative   1 daughter living. 1 daughter died at age 3.   No grandchildren.   Husband passed  06/2021. Married x 24 years.   Social Determinants of Health   Financial Resource Strain: Low Risk    Difficulty of Paying Living Expenses: Not hard at all  Food Insecurity: No Food Insecurity   Worried About Charity fundraiser in the Last Year: Never true   [place] in the Last Year: Never true  Transportation Needs: No Transportation Needs   Lack of Transportation (Medical): No   Lack of Transportation (Non-Medical): No  Physical Activity: Sufficiently Active   Days of Exercise per Week: 6 days   Minutes of Exercise per Session: 50 min  Stress: No Stress Concern Present   Feeling of Stress : Only a little  Social Connections: Moderately Integrated   Frequency of Communication with Friends and Family: More than three times a week   Frequency of Social Gatherings with Friends and Family: Three times a week   Attends Religious Services: More than 4 times per year   Active Member of Clubs or Organizations: Yes   Attends Archivist Meetings: More than 4 times per year   Marital Status: Widowed     Family History:  The patient's family history includes Heart attack in her mother; Ovarian cancer in her sister.   Review of Systems:    Please see the history of present illness.     All other systems reviewed and are otherwise negative except as noted above.   Physical Exam:    VS:  BP 118/78    Pulse (!) 48    Ht 5\' 4"  (1.626 m)    Wt 109 lb (49.4 kg)    SpO2 97%    BMI 18.71 kg/m    General:  Pleasant female appearing in no acute distress. Head: Normocephalic, atraumatic. Neck: No carotid bruits. JVD not elevated.  Lungs: Respirations regular and unlabored, without wheezes or rales.  Heart: Regular rhythm, bradycardiac rate. No S3 or S4.  No murmur, no rubs, or gallops appreciated. Abdomen: Appears non-distended. No obvious abdominal masses. Msk:  Strength and tone appear normal for age. No obvious joint deformities or effusions. Extremities: No clubbing or  cyanosis. No pitting edema.  Distal pedal pulses are 2+ bilaterally. Neuro: Alert and oriented X 3. Moves all extremities spontaneously. No focal deficits noted. Psych:  Responds to questions appropriately with a normal affect. Skin: No rashes or lesions noted  Wt Readings from Last 3 Encounters:  01/05/22 109 lb (49.4 kg)  12/21/21 107 lb (48.5 kg)  10/07/21 108 lb 9.6 oz (49.3 kg)      Studies/Labs Reviewed:   EKG:  EKG is not ordered today. EKG  from 04/2021 is reviewed and shows sinus bradycardia, HR 44 with no acute ST changes.   Recent Labs: 05/05/2021: Magnesium 2.3; TSH 1.490 10/07/2021: ALT 25; BUN 21; Creat 0.86; Potassium 4.5; Sodium 140   Lipid Panel    Component Value Date/Time   CHOL 207 (H) 11/16/2020 0739   TRIG 104 11/16/2020 0739   HDL 69 11/16/2020 0739   CHOLHDL 2.6 09/09/2019 0808   CHOLHDL 2.9 12/23/2014 0840   VLDL 12 12/23/2014 0840   LDLCALC 120 (H) 11/16/2020 0739    Additional studies/ records that were reviewed today include:   GXT: 10/2020 No diagnostic ST segment changes at 80% MPHR and 9.2 METS. There were no significant arrhythmias. Hypertensive exercise response. Blood pressure demonstrated a hypertensive response to exercise.  Event Monitor: 04/2021 Occasional supraventricular ectopy. Rare short runs of SVT up to 8 beats Rare ventricular ectopy Reported symptoms of dizziness correlated with sinus bradycardia at 42 bpm, rare PACs  Assessment:    1. Paroxysmal atrial fibrillation (HCC)   2. Current use of long term anticoagulation   3. Medication management   4. Screening for diabetes mellitus (DM)   5. Screening for lipid disorders      Plan:   In order of problems listed above:  1. Paroxysmal Atrial Fibrillation/Use of Long-Term Anticoagulation - She denies any recent palpitations and says her heart rate has been stable in the 40's at rest and will increase in the 90's with activity. She has remained on Flecainide 50 mg twice  daily as she was previously intolerant to daily beta-blocker therapy given her baseline bradycardia. She does have a prescription for PRN short-acting Cardizem but has not utilized this recently. - No reports of active bleeding. She remains on Xarelto 15 mg daily for anticoagulation. Will recheck CBC and BMET.   2. Screening for Type 2 DM - This has not been checked since at least 2021. Will recheck Hgb A1c with upcoming labs as she prefers to have lab results available prior to her upcoming visit with her PCP.   3. Screening for Lipid Disorders - FLP in 10/2020 showed total cholesterol 207, triglycerides 104, HDL 69 and LDL 120. She is not currently on statin therapy. Will recheck FLP.   She will follow-up in 1 year and I encouraged her to make [place] aware of any new cardiac symptoms in the interim as closer follow-up could be arranged.    Medication Adjustments/Labs and Tests Ordered: Current medicines are reviewed at length with the patient today.  Concerns regarding medicines are outlined above.  Medication changes, Labs and Tests ordered today are listed in the Patient Instructions below. Patient Instructions  Medication Instructions:  Your physician recommends that you continue on your current medications as directed. Please refer to the Current Medication list given to you today.  *If you need a refill on your cardiac medications before your next appointment, please call your pharmacy*   Lab Work: Your physician recommends that you return for lab work in: F[place]   If you have labs (blood work) drawn today and your tests are completely normal, you will receive your results only by: [place] (if you have [place]) OR A paper copy in the mail If you have any lab test that is abnormal or we need to change your treatment, we will call you to review the results.   Testing/Procedures: NONE    Follow-Up: At [facility], you and your health needs are our priority.  As part of  our continuing  mission to provide you with exceptional heart care, we have created designated Provider Care Teams.  These Care Teams include your primary Cardiologist (physician) and Advanced Practice Providers (APPs -  Physician Assistants and Nurse Practitioners) who all work together to provide you with the care you need, when you need it.  We recommend signing up for the patient portal called "MyChart".  Sign up information is provided on this After Visit Summary.  MyChart is used to connect with patients for Virtual Visits (Telemedicine).  Patients are able to view lab/test results, encounter notes, upcoming appointments, etc.  Non-urgent messages can be sent to your provider as well.   To learn more about what you can do with MyChart, go to [email or web address].    Your next appointment:   1 year(s)  The format for your next appointment:   In Person  Provider:   Carlyle Dolly, MD    Other Instructions Thank you for choosing [facility]!      Signed, Pamela Heritage, PA-C  01/05/2022 4:42 PM    [address]ela [address] [place], [place] [address] Phone: [phone] Fax: [phone]

## 2022-01-05 ENCOUNTER — Other Ambulatory Visit (HOSPITAL_COMMUNITY): Payer: Self-pay | Admitting: Family Medicine

## 2022-01-05 ENCOUNTER — Other Ambulatory Visit: Payer: Self-pay

## 2022-01-05 ENCOUNTER — Encounter: Payer: Self-pay | Admitting: Student

## 2022-01-05 ENCOUNTER — Ambulatory Visit: Payer: Medicare Other | Admitting: Student

## 2022-01-05 VITALS — BP 118/78 | HR 48 | Ht 64.0 in | Wt 109.0 lb

## 2022-01-05 DIAGNOSIS — I480 Paroxysmal atrial fibrillation: Secondary | ICD-10-CM | POA: Diagnosis not present

## 2022-01-05 DIAGNOSIS — Z79899 Other long term (current) drug therapy: Secondary | ICD-10-CM | POA: Diagnosis not present

## 2022-01-05 DIAGNOSIS — Z131 Encounter for screening for diabetes mellitus: Secondary | ICD-10-CM | POA: Diagnosis not present

## 2022-01-05 DIAGNOSIS — Z1231 Encounter for screening mammogram for malignant neoplasm of breast: Secondary | ICD-10-CM

## 2022-01-05 DIAGNOSIS — Z7901 Long term (current) use of anticoagulants: Secondary | ICD-10-CM

## 2022-01-05 DIAGNOSIS — Z13220 Encounter for screening for lipoid disorders: Secondary | ICD-10-CM

## 2022-01-05 MED ORDER — FLECAINIDE ACETATE 50 MG PO TABS: 50.0000 mg | ORAL_TABLET | Freq: Two times a day (BID) | ORAL | 3 refills | Status: DC

## 2022-01-05 MED ORDER — RIVAROXABAN 15 MG PO TABS: 15.0000 mg | ORAL_TABLET | Freq: Every day | ORAL | 3 refills | Status: DC

## 2022-01-05 NOTE — Patient Instructions (Signed)
Medication Instructions:  Your physician recommends that you continue on your current medications as directed. Please refer to the Current Medication list given to you today.  *If you need a refill on your cardiac medications before your next appointment, please call your pharmacy*   Lab Work: Your physician recommends that you return for lab work in: Fasting   If you have labs (blood work) drawn today and your tests are completely normal, you will receive your results only by: [place] (if you have [place]) OR A paper copy in the mail If you have any lab test that is abnormal or we need to change your treatment, we will call you to review the results.   Testing/Procedures: NONE    Follow-Up: At [facility], you and your health needs are our priority.  As part of our continuing mission to provide you with exceptional heart care, we have created designated Provider Care Teams.  These Care Teams include your primary Cardiologist (physician) and Advanced Practice Providers (APPs -  Physician Assistants and Nurse Practitioners) who all work together to provide you with the care you need, when you need it.  We recommend signing up for the patient portal called "MyChart".  Sign up information is provided on this After Visit Summary.  MyChart is used to connect with patients for Virtual Visits (Telemedicine).  Patients are able to view lab/test results, encounter notes, upcoming appointments, etc.  Non-urgent messages can be sent to your provider as well.   To learn more about what you can do with MyChart, go to [email or web address].    Your next appointment:   1 year(s)  The format for your next appointment:   In Person  Provider:   Carlyle Dolly, MD    Other Instructions Thank you for choosing [facility]!

## 2022-01-06 DIAGNOSIS — Z13220 Encounter for screening for lipoid disorders: Secondary | ICD-10-CM | POA: Diagnosis not present

## 2022-01-06 DIAGNOSIS — Z131 Encounter for screening for diabetes mellitus: Secondary | ICD-10-CM | POA: Diagnosis not present

## 2022-01-06 DIAGNOSIS — I480 Paroxysmal atrial fibrillation: Secondary | ICD-10-CM | POA: Diagnosis not present

## 2022-01-06 DIAGNOSIS — Z79899 Other long term (current) drug therapy: Secondary | ICD-10-CM | POA: Diagnosis not present

## 2022-01-07 LAB — CBC
Hematocrit: 41.2 % (ref 34.0–46.6)
Hemoglobin: 13.6 g/dL (ref 11.1–15.9)
MCH: 27.5 pg (ref 26.6–33.0)
MCHC: 33 g/dL (ref 31.5–35.7)
MCV: 83 fL (ref 79–97)
Platelets: 174 10*3/uL (ref 150–450)
RBC: 4.94 x10E6/uL (ref 3.77–5.28)
RDW: 13.2 % (ref 11.7–15.4)
WBC: 5.5 10*3/uL (ref 3.4–10.8)

## 2022-01-07 LAB — COMPREHENSIVE METABOLIC PANEL
ALT: 39 IU/L — ABNORMAL HIGH (ref 0–32)
AST: 42 IU/L — ABNORMAL HIGH (ref 0–40)
Albumin/Globulin Ratio: 1.9 (ref 1.2–2.2)
Albumin: 4.5 g/dL (ref 3.7–4.7)
Alkaline Phosphatase: 86 IU/L (ref 44–121)
BUN/Creatinine Ratio: 23 (ref 12–28)
BUN: 19 mg/dL (ref 8–27)
Bilirubin Total: 0.7 mg/dL (ref 0.0–1.2)
CO2: 24 mmol/L (ref 20–29)
Calcium: 9.5 mg/dL (ref 8.7–10.3)
Chloride: 104 mmol/L (ref 96–106)
Creatinine, Ser: 0.82 mg/dL (ref 0.57–1.00)
Globulin, Total: 2.4 g/dL (ref 1.5–4.5)
Glucose: 90 mg/dL (ref 70–99)
Potassium: 4 mmol/L (ref 3.5–5.2)
Sodium: 142 mmol/L (ref 134–144)
Total Protein: 6.9 g/dL (ref 6.0–8.5)
eGFR: 76 mL/min/{1.73_m2} (ref >59)

## 2022-01-07 LAB — HEMOGLOBIN A1C
Est. average glucose Bld gHb Est-mCnc: 123 mg/dL
Hgb A1c MFr Bld: 5.9 % — ABNORMAL HIGH (ref 4.8–5.6)

## 2022-01-07 LAB — LIPID PANEL
Chol/HDL Ratio: 3 ratio (ref 0.0–4.4)
Cholesterol, Total: 205 mg/dL — ABNORMAL HIGH (ref 100–199)
HDL: 69 mg/dL (ref >39)
LDL Chol Calc (NIH): 120 mg/dL — ABNORMAL HIGH (ref 0–99)
Triglycerides: 88 mg/dL (ref 0–149)
VLDL Cholesterol Cal: 16 mg/dL (ref 5–40)

## 2022-01-07 LAB — TSH: TSH: 2.48 u[IU]/mL (ref 0.450–4.500)

## 2022-01-21 ENCOUNTER — Other Ambulatory Visit: Payer: Self-pay

## 2022-01-21 ENCOUNTER — Encounter: Payer: Self-pay | Admitting: Nurse Practitioner

## 2022-01-21 ENCOUNTER — Ambulatory Visit (INDEPENDENT_AMBULATORY_CARE_PROVIDER_SITE_OTHER): Payer: Medicare Other | Admitting: Nurse Practitioner

## 2022-01-21 VITALS — BP 114/68 | HR 46 | Temp 98.0°F | Ht 64.0 in | Wt 107.6 lb

## 2022-01-21 DIAGNOSIS — Z01419 Encounter for gynecological examination (general) (routine) without abnormal findings: Secondary | ICD-10-CM | POA: Diagnosis not present

## 2022-01-21 DIAGNOSIS — B9689 Other specified bacterial agents as the cause of diseases classified elsewhere: Secondary | ICD-10-CM

## 2022-01-21 DIAGNOSIS — N760 Acute vaginitis: Secondary | ICD-10-CM

## 2022-01-21 LAB — POCT WET PREP WITH KOH
Clue Cells Wet Prep HPF POC: NEGATIVE
KOH Prep POC: NEGATIVE
Trichomonas, UA: NEGATIVE
pH, Wet Prep: 7

## 2022-01-21 MED ORDER — METRONIDAZOLE 0.75 % EX GEL: CUTANEOUS | 0 refills | Status: DC

## 2022-01-21 NOTE — Progress Notes (Signed)
° °  Subjective:    Patient ID: Pamela Yang, female    DOB: Jun 19, 1951, 71 y.o.   MRN: [id number]  HPI  The patient comes in today for a wellness visit.    A review of their health history was completed.  A review of medications was also completed.  Any needed refills; No  Eating habits:   Falls/  MVA accidents in past few months: No  Regular exercise: Yes  Specialist pt sees on regular basis: Yes, cardiologist Bernerd Pho, [place]) and endocrinologist (Dr. Catalina Gravel, every 2 years)  Preventative health issues were discussed.   Additional concerns:  Patient here for 6 month follow up. Patient states she has been doing good on medications.  Review of Systems     Objective:   Physical Exam        Assessment & Plan:

## 2022-01-21 NOTE — Patient Instructions (Signed)
Bacterial Vaginosis °Bacterial vaginosis is an infection that occurs when the normal balance of bacteria in the vagina changes. This change is caused by an overgrowth of certain bacteria in the vagina. Bacterial vaginosis is the most common vaginal infection among females aged 73 to 44 years. °This condition increases the risk of sexually transmitted infections (STIs). Treatment can help reduce this risk. Treatment is very important for pregnant women because this condition can cause babies to be born early (prematurely) or at a low birth weight. °What are the causes? °This condition is caused by an increase in harmful bacteria that are normally present in small amounts in the vagina. However, the exact reason this condition develops is not known. °You cannot get bacterial vaginosis from toilet seats, bedding, swimming pools, or contact with objects around you. °What increases the risk? °The following factors may make you more likely to develop this condition: °Having a new sexual partner or multiple sexual partners, or having unprotected sex. °Douching. °Having an intrauterine device (IUD). °Smoking. °Abusing drugs and alcohol. This may lead to riskier sexual behavior. °Taking certain antibiotic medicines. °Being pregnant. °What are the signs or symptoms? °Some women with this condition have no symptoms. Symptoms may include: °Gray or white vaginal discharge. The discharge can be watery or foamy. °A fish-like odor with discharge, especially after sex or during menstruation. °Itching in and around the vagina. °Burning or pain with urination. °How is this diagnosed? °This condition is diagnosed based on: °Your medical history. °A physical exam of the vagina. °Checking a sample of vaginal fluid for harmful bacteria or abnormal cells. °How is this treated? °This condition is treated with antibiotic medicines. These may be given as a pill, a vaginal cream, or a medicine that is put into the vagina (suppository). If the  condition comes back after treatment, a second round of antibiotics may be needed. °Follow these instructions at home: °Medicines °Take or apply over-the-counter and prescription medicines only as told by your health care provider. °Take or apply your antibiotic medicine as told by your health care provider. Do not stop using the antibiotic even if you start to feel better. °General instructions °If you have a female sexual partner, tell her that you have a vaginal infection. She should follow up with her health care provider. If you have a female sexual partner, he does not need treatment. °Avoid sexual activity until you finish treatment. °Drink enough fluid to keep your urine pale yellow. °Keep the area around your vagina and rectum clean. °Wash the area daily with warm water. °Wipe yourself from front to back after using the toilet. °If you are breastfeeding, talk to your health care provider about continuing breastfeeding during treatment. °Keep all follow-up visits. This is important. °How is this prevented? °Self-care °Do not douche. °Wash the outside of your vagina with warm water only. °Wear cotton or cotton-lined underwear. °Avoid wearing tight pants and pantyhose, especially during the summer. °Safe sex °Use protection when having sex. This includes: °Using condoms. °Using dental dams. This is a thin layer of a material made of latex or polyurethane that protects the mouth during oral sex. °Limit the number of sexual partners. To help prevent bacterial vaginosis, it is best to have sex with just one partner (monogamous relationship). °Make sure you and your sexual partner are tested for STIs. °Drugs and alcohol °Do not use any products that contain nicotine or tobacco. These products include cigarettes, chewing tobacco, and vaping devices, such as e-cigarettes. If you need help quitting,   ask your health care provider. °Do not use drugs. °Do not drink alcohol if: °Your health care provider tells you not to  do this. °You are pregnant, may be pregnant, or are planning to become pregnant. °If you drink alcohol: °Limit how much you have to 0-1 drink a day. °Be aware of how much alcohol is in your drink. In the [place] one drink equals one 12 oz bottle of beer (355 mL), one 5 oz glass of wine (148 mL), or one 1½ oz glass of hard liquor (44 mL). °Where to find more information °Centers for Disease Control and Prevention: www.cdc.gov °American Sexual Health Association (ASHA): www.ashastd.org °[place] Department of Health and Human Services, Office on Women's Health: www.womenshealth.gov °Contact a health care provider if: °Your symptoms do not improve, even after treatment. °You have more discharge or pain when urinating. °You have a fever or chills. °You have pain in your abdomen or pelvis. °You have pain during sex. °You have vaginal bleeding between menstrual periods. °Summary °Bacterial vaginosis is a vaginal infection that occurs when the normal balance of bacteria in the vagina changes. It results from an overgrowth of certain bacteria. °This condition increases the risk of sexually transmitted infections (STIs). Getting treated can help reduce this risk. °Treatment is very important for pregnant women because this condition can cause babies to be born early (prematurely) or at low birth weight. °This condition is treated with antibiotic medicines. These may be given as a pill, a vaginal cream, or a medicine that is put into the vagina (suppository). °This information is not intended to replace advice given to you by your health care provider. Make sure you discuss any questions you have with your health care provider. °Document Revised: 06/04/2020 Document Reviewed: 06/04/2020 °Elsevier Patient Education © 2022 Elsevier Inc. ° °

## 2022-01-21 NOTE — Progress Notes (Signed)
Subjective:    Patient ID: Pamela Yang, female    DOB: [birth year], 71 y.o.   MRN: [id number]  HPI: Presents today for her annual physical. Daily walking for exercise and eats a well balanced diet. Former smoker quit 50 years ago. Not currently sexually active,  Menopausal, reports vaginal odor for 6 months. No itching, pain, rash, abnormal discharge, dysuria, hematuria, frequency or urgency. Recommended vaccines and screening for age up to date. Bone density was completed in sept 2022 by per patient through [facility] where she gets her Prolia. Regular vision and dental exams.    Review of Systems  Constitutional:  Negative for activity change, appetite change and fatigue.  HENT:  Negative for sore throat and trouble swallowing.   Respiratory:  Negative for cough, chest tightness, shortness of breath and wheezing.   Cardiovascular:  Negative for chest pain.  Gastrointestinal:  Negative for abdominal distention, abdominal pain, constipation, diarrhea, nausea and vomiting.  Genitourinary:  Negative for difficulty urinating, dysuria, frequency, genital sores, menstrual problem, pelvic pain, urgency and vaginal discharge.       Vaginal odor   Depression screen [facility] 2/9 01/21/2022  Decreased Interest 0  Down, Depressed, Hopeless 0  PHQ - 2 Score 0  Altered sleeping 0  Tired, decreased energy 0  Change in appetite 0  Feeling bad or failure about yourself  0  Trouble concentrating 0  Moving slowly or fidgety/restless 0  Suicidal thoughts 0  PHQ-9 Score 0  Difficult doing work/chores Not difficult at all  Some recent data might be hidden       Objective:   Today's Vitals   01/21/22 1020  BP: 114/68  Pulse: (!) 46  Temp: 98 F (36.7 C)  TempSrc: Oral  SpO2: 100%  Weight: 107 lb 9.6 oz (48.8 kg)  Height: 5\' 4"  (1.626 m)   Body mass index is 18.47 kg/m.     Physical Exam Vitals and nursing note reviewed. Exam conducted with a chaperone present (External genitalia is  normal in appearance without lesions, swelling, masses or tenderness. Vagina is pink and moist without lesions or discharge.).  Constitutional:      General: She is not in acute distress.    Appearance: She is well-developed.  Neck:     Comments: Thyroid non tender to palpation. No mass or goiter noted.  Cardiovascular:     Rate and Rhythm: Normal rate and regular rhythm.     Heart sounds: Normal heart sounds. No murmur heard. Pulmonary:     Effort: Pulmonary effort is normal.     Breath sounds: Normal breath sounds.  Chest:  Breasts:    Right: No swelling, inverted nipple, mass, skin change or tenderness.     Left: No swelling, inverted nipple, mass, skin change or tenderness.  Abdominal:     General: There is no distension.     Palpations: Abdomen is soft.     Tenderness: There is no abdominal tenderness.  Genitourinary:    General: Normal vulva.     Exam position: Lithotomy position.     Labia:        Right: No rash, tenderness or lesion.        Left: No rash, tenderness or lesion.      Vagina: No vaginal discharge, tenderness or lesions.     Cervix: No discharge, friability, lesion or erythema.     Adnexa: Right adnexa normal and left adnexa normal.  Musculoskeletal:     Cervical back: Normal range of  motion and neck supple.  Lymphadenopathy:     Cervical: No cervical adenopathy.     Upper Body:     Right upper body: No supraclavicular, axillary or pectoral adenopathy.     Left upper body: No supraclavicular, axillary or pectoral adenopathy.  Skin:    General: Skin is warm and dry.     Findings: No rash.  Neurological:     Mental Status: She is alert and oriented to person, place, and time.  Psychiatric:        Mood and Affect: Mood normal.        Behavior: Behavior normal.        Thought Content: Thought content normal.        Judgment: Judgment normal.   Results for orders placed or performed in visit on 01/21/22  POCT Wet Prep with KOH  Result Value Ref Range    Trichomonas, UA Negative    Clue Cells Wet Prep HPF POC neg    Epithelial Wet Prep HPF POC Few Few, Moderate, Many, Too numerous to count   Yeast Wet Prep HPF POC     Bacteria Wet Prep HPF POC None (A) Few   RBC Wet Prep HPF POC none    WBC Wet Prep HPF POC none    KOH Prep POC Negative Negative   pH, Wet Prep 7.0        Assessment & Plan:  Well woman exam  Bacterial vaginosis - Plan: POCT Wet Prep with KOH  Plan: Meds ordered this encounter  Medications   metroNIDAZOLE (METROGEL) 0.75 % gel    Sig: Apply vaginally once at night for 5 nights    Dispense:  45 g    Refill:  0    Order Specific Question:   Supervising Provider    Answer:   [place]    Vaginal Hygiene educational material sent home with patient. Recheck if vaginal odor persists.  Continue activity and healthy diet.  Return in about 6 months (around 07/21/2022) for Phone or virutal is fine.    Marland Kitchen

## 2022-01-22 ENCOUNTER — Other Ambulatory Visit: Payer: Self-pay | Admitting: Family Medicine

## 2022-01-27 ENCOUNTER — Other Ambulatory Visit: Payer: Self-pay

## 2022-01-27 DIAGNOSIS — R7989 Other specified abnormal findings of blood chemistry: Secondary | ICD-10-CM

## 2022-02-21 ENCOUNTER — Other Ambulatory Visit: Payer: Self-pay

## 2022-02-21 ENCOUNTER — Ambulatory Visit (HOSPITAL_COMMUNITY)
Admission: RE | Admit: 2022-02-21 | Discharge: 2022-02-21 | Disposition: A | Discharge status: Home / Self Care | Payer: Medicare Other | Source: Ambulatory Visit | Attending: Family Medicine | Admitting: Family Medicine

## 2022-02-21 DIAGNOSIS — Z1231 Encounter for screening mammogram for malignant neoplasm of breast: Secondary | ICD-10-CM | POA: Insufficient documentation

## 2022-03-17 DIAGNOSIS — R7989 Other specified abnormal findings of blood chemistry: Secondary | ICD-10-CM | POA: Diagnosis not present

## 2022-03-18 LAB — HEPATIC FUNCTION PANEL
ALT: 28 IU/L (ref 0–32)
AST: 32 IU/L (ref 0–40)
Albumin: 4.4 g/dL (ref 3.7–4.7)
Alkaline Phosphatase: 70 IU/L (ref 44–121)
Bilirubin Total: 0.5 mg/dL (ref 0.0–1.2)
Bilirubin, Direct: 0.16 mg/dL (ref 0.00–0.40)
Total Protein: 6.5 g/dL (ref 6.0–8.5)

## 2022-03-28 ENCOUNTER — Ambulatory Visit (INDEPENDENT_AMBULATORY_CARE_PROVIDER_SITE_OTHER): Payer: Medicare Other | Admitting: Family Medicine

## 2022-03-28 VITALS — BP 114/75 | HR 51 | Temp 97.9°F | Wt 106.0 lb

## 2022-03-28 DIAGNOSIS — M810 Age-related osteoporosis without current pathological fracture: Secondary | ICD-10-CM

## 2022-03-28 DIAGNOSIS — F419 Anxiety disorder, unspecified: Secondary | ICD-10-CM

## 2022-03-28 MED ORDER — SERTRALINE HCL 50 MG PO TABS: ORAL_TABLET | ORAL | 3 refills | Status: DC

## 2022-03-28 NOTE — Progress Notes (Signed)
? ?  Subjective:  ? ? Patient ID: Pamela Yang, female    DOB: [birth year], 71 y.o.   MRN: [id number] ? ?HPI ?Patient is here for a follow up appt, she voices no concerns at this time , doing well on current medications  ?Very nice patient ?Has underlying anxiety ?Denies being depressed currently ?Lost her spouse a while back ?Working through things in a good way ?Volunteering staying active ?Treated by endocrinology for osteoporosis ?Treated by cardiology on a regular basis ? ?Review of Systems ? ?   ?Objective:  ? Physical Exam ?General-in no acute distress ?Eyes-no discharge ?Lungs-respiratory rate normal, CTA ?CV-no murmurs,RRR ?Extremities skin warm dry no edema ?Neuro grossly normal ?Behavior normal, alert ? ? ? ? ?   ?Assessment & Plan:  ?Atrial fibs under good control currently ?Anticoagulants no bleeding issues ?Moods overall are doing well stable patient benefits from sertraline she would like to continue this ?Follow-up again in 1 years time ? ?Under the care of endocrinology for osteoporosis ? ?

## 2022-06-09 DIAGNOSIS — M810 Age-related osteoporosis without current pathological fracture: Secondary | ICD-10-CM | POA: Diagnosis not present

## 2022-11-04 ENCOUNTER — Encounter: Payer: Self-pay | Admitting: Student

## 2022-11-04 ENCOUNTER — Ambulatory Visit (INDEPENDENT_AMBULATORY_CARE_PROVIDER_SITE_OTHER): Payer: Medicare Other

## 2022-11-04 ENCOUNTER — Ambulatory Visit: Payer: Medicare Other | Attending: Student | Admitting: Student

## 2022-11-04 VITALS — BP 124/74 | HR 51 | Ht 64.0 in | Wt 107.0 lb

## 2022-11-04 DIAGNOSIS — I498 Other specified cardiac arrhythmias: Secondary | ICD-10-CM | POA: Diagnosis not present

## 2022-11-04 DIAGNOSIS — I480 Paroxysmal atrial fibrillation: Secondary | ICD-10-CM | POA: Diagnosis not present

## 2022-11-04 DIAGNOSIS — Z7901 Long term (current) use of anticoagulants: Secondary | ICD-10-CM | POA: Diagnosis not present

## 2022-11-04 NOTE — Patient Instructions (Signed)
Medication Instructions:  STOP Flecainide   Labwork: CBC,BMET,TSH,Magnesium at [facility]  Testing/Procedures: ZIO XT- Long Term Monitor Instructions   Your physician has requested you wear your ZIO patch monitor___7____days.   This is a single patch monitor.  Irhythm supplies one patch monitor per enrollment.  Additional stickers are not available.   PDo not shower for the first 24 hours.  You may shower after the first 24 hours.   Press button if you feel a symptom. You will hear a small click.  Record Date, Time and Symptom in the Patient Log Book.   When you are ready to remove patch, follow instructions on last 2 pages of Patient Log Book.  Stick patch monitor onto last page of Patient Log Book.   Place Patient Log Book in [place] box.  Use locking tab on box and tape box closed securely.  The [employer] has [employer] on it.  Please place in mailbox as soon as possible.  Your physician should have your test results approximately 7 days after the monitor has been mailed back to [facility].   Call [place] at [phone] if you have questions regarding your ZIO XT patch monitor.  Call them immediately if you see an orange light blinking on your monitor.   If your monitor falls off in less than 4 days contact our Monitor department at [phone].  If your monitor becomes loose or falls off after 4 days call [employer] at [phone] for suggestions on securing your monitor.    Follow-Up: 3 months Dr.Branch  You have been referred to Electrophysiology, Dr.Mealor at the [facility] office in December   Any Other Special Instructions Will Be Listed Below (If Applicable).  If you need a refill on your cardiac medications before your next appointment, please call your pharmacy.

## 2022-11-04 NOTE — Progress Notes (Unsigned)
Cardiology Office Note    Date:  11/05/2022   ID:  Pamela Yang, Pamela Yang 1951/05/19, MRN [id number]  PCP:  Kathyrn Drown, MD  Cardiologist: Carlyle Dolly, MD    Chief Complaint  Patient presents with   Follow-up    Annual Visit    History of Present Illness:    Pamela Yang is a 71 y.o. female with past medical history of paroxysmal atrial fibrillation (diagnosed in 05/2020) and osteoporosis who presents to the office today for annual follow-up.  She was examined by myself in 12/2021 and was exercising routinely and denied any recent anginal symptoms. Reported her resting heart rate was in the 40's but did increase into the 90's with activity.  Was continued on Flecainide 50 mg twice daily as she was previously intolerant to baseline AV nodal blocking agents given her bradycardia. Was also continued on Xarelto 15 mg daily for anticoagulation.  In talking with the patient today, she reports overall feeling well since her last office visit. Still remains active by exercising at the [facility] several days per week and walks her dogs. She denies any chest pain or palpitations with these activities. Respiratory status has been stable and no recent orthopnea, PND or pitting edema. Reports occasional dizziness but no presyncope or actual syncopal episodes. Remains on Xarelto for anticoagulation with no reports of active bleeding.   Past Medical History:  Diagnosis Date   Atrial fibrillation (HCC)    Dysrhythmia    GAD (generalized anxiety disorder)    Gastroparesis 04/2007   Dr. Stann Mainland   GERD (gastroesophageal reflux disease)    Hearing loss    Osteoporosis     Past Surgical History:  Procedure Laterality Date   COLONOSCOPY  05/20/2011   Dr Laural Golden   COLONOSCOPY N/A 07/08/2021   Procedure: COLONOSCOPY;  Surgeon: Rogene Houston, MD;  [facility];  Service: Endoscopy;  Laterality: N/A;  10:55   TONSILLECTOMY      Current Medications: Outpatient Medications Prior  to Visit  Medication Sig Dispense Refill   Ascorbic Acid (VITAMIN C PO) Take 1,000 mg by mouth daily.     denosumab (PROLIA) 60 MG/ML SOSY injection Inject 60 mg into the skin every 6 (six) months.     Flaxseed, Linseed, (FLAXSEED OIL) 1200 MG CAPS Take 1,200 mg by mouth 2 (two) times daily.     Multiple Vitamins-Minerals (MULTIVITAMIN WITH MINERALS) tablet Take 1 tablet by mouth daily.     Propylene Glycol (SYSTANE BALANCE) 0.6 % SOLN Place 2 drops into both eyes in the morning and at bedtime. Into both eyes     Rivaroxaban (XARELTO) 15 MG TABS tablet Take 1 tablet (15 mg total) by mouth daily. 90 tablet 3   sertraline (ZOLOFT) 50 MG tablet 1 qd 90 tablet 3   flecainide (TAMBOCOR) 50 MG tablet Take 1 tablet (50 mg total) by mouth 2 (two) times daily. 180 tablet 3   diltiazem (CARDIZEM) 30 MG tablet Take 1 tablet (30 mg total) by mouth as needed (atrial fibrillation). (Patient not taking: Reported on 11/04/2022) 30 tablet 1   No facility-administered medications prior to visit.     Allergies:   Patient has no known allergies.   Social History   Socioeconomic History   Marital status: Widowed    Spouse name: Not on file   Number of children: 2   Years of education: Not on file   Highest education level: Not on file  Occupational History   Not  on file  Tobacco Use   Smoking status: Former    Types: Cigarettes    Quit date: 12/20/1979    Years since quitting: 42.9   Smokeless tobacco: Never  Vaping Use   Vaping Use: Never used  Substance and Sexual Activity   Alcohol use: Yes    Alcohol/week: 7.0 standard drinks of alcohol    Types: 7 Glasses of wine per week    Comment: occ   Drug use: Never   Sexual activity: Yes    Birth control/protection: Post-menopausal  Other Topics Concern   Not on file  Social History Narrative   1 daughter living. 1 daughter died at age 2.   No grandchildren.   Husband passed 06/2021. Married x 24 years.   Social Determinants of Health    Financial Resource Strain: Low Risk  (12/21/2021)   Overall Financial Resource Strain (CARDIA)    Difficulty of Paying Living Expenses: Not hard at all  Food Insecurity: No Food Insecurity (12/21/2021)   Hunger Vital Sign    Worried About Running Out of Food in the Last Year: Never true    Ran Out of Food in the Last Year: Never true  Transportation Needs: No Transportation Needs (12/21/2021)   PRAPARE - Hydrologist (Medical): No    Lack of Transportation (Non-Medical): No  Physical Activity: Sufficiently Active (12/21/2021)   Exercise Vital Sign    Days of Exercise per Week: 6 days    Minutes of Exercise per Session: 50 min  Stress: No Stress Concern Present (12/21/2021)   [place]    Feeling of Stress : Only a little  Social Connections: Moderately Integrated (12/21/2021)   Social Connection and Isolation Panel [NHANES]    Frequency of Communication with Friends and Family: More than three times a week    Frequency of Social Gatherings with Friends and Family: Three times a week    Attends Religious Services: More than 4 times per year    Active Member of Clubs or Organizations: Yes    Attends Archivist Meetings: More than 4 times per year    Marital Status: Widowed     Family History:  The patient's family history includes Coronary artery disease in her brother; Heart attack in her mother; Hypertension in her sister and sister; Osteoporosis in her sister; Ovarian cancer in her sister.   Review of Systems:    Please see the history of present illness.     All other systems reviewed and are otherwise negative except as noted above.   Physical Exam:    VS:  BP 124/74   Pulse (!) 51   Ht '5\' 4"'$  (1.626 m)   Wt 107 lb (48.5 kg)   SpO2 100%   BMI 18.37 kg/m    General: Well developed, well nourished,female appearing in no acute distress. Head: Normocephalic, atraumatic. Neck:  No carotid bruits. JVD not elevated.  Lungs: Respirations regular and unlabored, without wheezes or rales.  Heart: Regular rhythm, bradycardiac rate. No S3 or S4.  No murmur, no rubs, or gallops appreciated. Abdomen: Appears non-distended. No obvious abdominal masses. Msk:  Strength and tone appear normal for age. No obvious joint deformities or effusions. Extremities: No clubbing or cyanosis. No pitting edema.  Distal pedal pulses are 2+ bilaterally. Neuro: Alert and oriented X 3. Moves all extremities spontaneously. No focal deficits noted. Psych:  Responds to questions appropriately with a normal affect. Skin:  No rashes or lesions noted  Wt Readings from Last 3 Encounters:  11/04/22 107 lb (48.5 kg)  03/28/22 106 lb (48.1 kg)  01/21/22 107 lb 9.6 oz (48.8 kg)     Studies/Labs Reviewed:   EKG:  EKG is ordered today.  The ekg ordered today demonstrates junctional escape rhythm, HR 51. No acute ST changes.   Recent Labs: 01/06/2022: BUN 19; Creatinine, Ser 0.82; Hemoglobin 13.6; Platelets 174; Potassium 4.0; Sodium 142; TSH 2.480 03/17/2022: ALT 28   Lipid Panel    Component Value Date/Time   CHOL 205 (H) 01/06/2022 0740   TRIG 88 01/06/2022 0740   HDL 69 01/06/2022 0740   CHOLHDL 3.0 01/06/2022 0740   CHOLHDL 2.9 12/23/2014 0840   VLDL 12 12/23/2014 0840   LDLCALC 120 (H) 01/06/2022 0740    Additional studies/ records that were reviewed today include:   GXT: 10/2020 No diagnostic ST segment changes at 80% MPHR and 9.2 METS. There were no significant arrhythmias. Hypertensive exercise response. Blood pressure demonstrated a hypertensive response to exercise.   Event Monitor: 05/2021 Occasional supraventricular ectopy. Rare short runs of SVT up to 8 beats Rare ventricular ectopy Reported symptoms of dizziness correlated with sinus bradycardia at 42 bpm, rare PACs     Patch Wear Time:  14 days and 0 hours (2022-05-21T20:45:55-0400 to 2022-06-04T20:45:59-0400)   Patient  had a min HR of 36 bpm, max HR of 167 bpm, and avg HR of 50 bpm. Predominant underlying rhythm was Sinus Rhythm. 11 Supraventricular Tachycardia runs occurred, the run with the fastest interval lasting 7 beats with a max rate of 167 bpm, the  longest lasting 8 beats with an avg rate of 104 bpm. Some episodes of Supraventricular Tachycardia may be possible Atrial Tachycardia with variable block. Isolated SVEs were occasional (1.1%, 9059), SVE Couplets were rare (<1.0%, 169), and SVE Triplets  were rare (<1.0%, 25). Isolated VEs were rare (<1.0%), and no VE Couplets or VE Triplets were present.  Assessment:    1. Paroxysmal atrial fibrillation (HCC)   2. Junctional rhythm   3. Current use of long term anticoagulation      Plan:   In order of problems listed above:  1. Paroxysmal Atrial Fibrillation complicated by Junctional Escape Rhythm/Use of Long-term Anticoagulation - She has overall been doing well but was found to be in a junctional escape rhythm by EKG today and asymptomatic at this time. I reviewed her case with Dr. Harl Bowie and will stop Flecainide '50mg'$  BID. Listed as taking PRN short-acting Cardizem but she has not utilized this in over a year. Will check labs to include CBC, BMET, TSH and Mg. Will also place a 7-day Zio patch. Will arrange for EP follow-up as she was previously intolerant to low-dose Cardizem and beta-blockers due to bradycardia and we are now having to stop Flecainide. Will see if they prefer an observation approach to assess for recurrent atrial fibrillation or want to consider a different antiarrhythmic option. Will make sure her monitor has resulted prior to her appointment.  - No reports of active bleeding. Continue Xarelto '15mg'$  daily for anticoagulation which is the appropriate dose given her calculated CrCl. Will recheck a CBC and BMET.    Medication Adjustments/Labs and Tests Ordered: Current medicines are reviewed at length with the patient today.  Concerns  regarding medicines are outlined above.  Medication changes, Labs and Tests ordered today are listed in the Patient Instructions below. Patient Instructions  Medication Instructions:  STOP Flecainide   Labwork: CBC,BMET,TSH,Magnesium  at [facility]  Testing/Procedures: [place] Instructions   Your physician has requested you wear your ZIO patch monitor___7____days.   This is a single patch monitor.  Irhythm supplies one patch monitor per enrollment.  Additional stickers are not available.   PDo not shower for the first 24 hours.  You may shower after the first 24 hours.   Press button if you feel a symptom. You will hear a small click.  Record Date, Time and Symptom in the Patient Log Book.   When you are ready to remove patch, follow instructions on last 2 pages of Patient Log Book.  Stick patch monitor onto last page of Patient Log Book.   Place Patient Log Book in [place] box.  Use locking tab on box and tape box closed securely.  The [employer] has [employer] on it.  Please place in mailbox as soon as possible.  Your physician should have your test results approximately 7 days after the monitor has been mailed back to [facility]   Call [place] at [phone] if you have questions regarding your ZIO XT patch monitor.  Call them immediately if you see an orange light blinking on your monitor.   If your monitor falls off in less than 4 days contact our Monitor department at [phone].  If your monitor becomes loose or falls off after 4 days call [employer] at [phone] for suggestions on securing your monitor.    Follow-Up: 3 months Dr.Branch  You have been referred to Electrophysiology, Dr.Mealor at the [facility] office in December   Any Other Special Instructions Will Be Listed Below (If Applicable).  If you need a refill on your cardiac medications before your next appointment, please call your pharmacy.     Signed, Erma Heritage, PA-C  11/05/2022 7:52 PM    [place] [address] [place], [place] [address] Phone: [phone] Fax: [phone]

## 2022-11-05 ENCOUNTER — Encounter: Payer: Self-pay | Admitting: Student

## 2022-11-07 DIAGNOSIS — I498 Other specified cardiac arrhythmias: Secondary | ICD-10-CM | POA: Diagnosis not present

## 2022-11-08 LAB — CBC
Hematocrit: 36.4 % (ref 34.0–46.6)
Hemoglobin: 12.3 g/dL (ref 11.1–15.9)
MCH: 28.2 pg (ref 26.6–33.0)
MCHC: 33.8 g/dL (ref 31.5–35.7)
MCV: 84 fL (ref 79–97)
Platelets: 168 10*3/uL (ref 150–450)
RBC: 4.36 x10E6/uL (ref 3.77–5.28)
RDW: 12.9 % (ref 11.7–15.4)
WBC: 5.3 10*3/uL (ref 3.4–10.8)

## 2022-11-08 LAB — BASIC METABOLIC PANEL
BUN/Creatinine Ratio: 31 — ABNORMAL HIGH (ref 12–28)
BUN: 25 mg/dL (ref 8–27)
CO2: 25 mmol/L (ref 20–29)
Calcium: 10.1 mg/dL (ref 8.7–10.3)
Chloride: 103 mmol/L (ref 96–106)
Creatinine, Ser: 0.8 mg/dL (ref 0.57–1.00)
Glucose: 86 mg/dL (ref 70–99)
Potassium: 4.3 mmol/L (ref 3.5–5.2)
Sodium: 142 mmol/L (ref 134–144)
eGFR: 78 mL/min/{1.73_m2} (ref >59)

## 2022-11-08 LAB — MAGNESIUM: Magnesium: 2.2 mg/dL (ref 1.6–2.3)

## 2022-11-08 LAB — TSH: TSH: 1.52 u[IU]/mL (ref 0.450–4.500)

## 2022-11-12 ENCOUNTER — Other Ambulatory Visit: Payer: Self-pay | Admitting: Student

## 2022-11-14 NOTE — Telephone Encounter (Signed)
Prescription refill request for Xarelto received.  Indication: PAF Last office visit: 11/04/22  B Strader PA-C Weight: 48.5kg Age: 71 Scr: 0.80 CrCl: 47.95  Based on above findings Xarelto '15mg'$  daily is the appropriate dose.  Refill approved.

## 2022-11-22 DIAGNOSIS — I498 Other specified cardiac arrhythmias: Secondary | ICD-10-CM | POA: Diagnosis not present

## 2022-12-02 ENCOUNTER — Encounter: Payer: Self-pay | Admitting: Cardiovascular Disease

## 2022-12-02 ENCOUNTER — Ambulatory Visit: Payer: Medicare Other | Attending: Cardiovascular Disease | Admitting: Cardiovascular Disease

## 2022-12-02 VITALS — BP 140/82 | HR 44 | Ht 64.0 in | Wt 107.6 lb

## 2022-12-02 DIAGNOSIS — I480 Paroxysmal atrial fibrillation: Secondary | ICD-10-CM

## 2022-12-02 NOTE — Patient Instructions (Signed)
Medication Instructions:  Your physician recommends that you continue on your current medications as directed. Please refer to the Current Medication list given to you today.  *If you need a refill on your cardiac medications before your next appointment, please call your pharmacy*   Follow-Up: At  [facility], you and your health needs are our priority.  As part of our continuing mission to provide you with exceptional heart care, we have created designated Provider Care Teams.  These Care Teams include your primary Cardiologist (physician) and Advanced Practice Providers (APPs -  Physician Assistants and Nurse Practitioners) who all work together to provide you with the care you need, when you need it.   Your next appointment:   6 month(s)  The format for your next appointment:   In Person  Provider:   You may see Augustus E Mealor, MD or one of the following Advanced Practice Providers on your designated Care Team:   Renee Ursuy, PA-C Michael "Andy" Tillery, PA-C    Important Information About Sugar       

## 2022-12-02 NOTE — Progress Notes (Signed)
Electrophysiology Office Note:    Date:  12/02/2022   ID:  DYLLAN KATS, DOB [birth year], MRN [id number]  PCP:  Kathyrn Drown, MD   [place] [facility] Cardiologist:  Carlyle Dolly, MD Electrophysiologist:  Melida Quitter, MD     Referring MD: Erma Heritage, [place]*   History of Present Illness:    Pamela Yang is a 71 y.o. female with a hx listed below, significant for atrial fibrillation, referred for arrhythmia management.  She was diagnosed with AF in June, 2021 and managed with flecainide. I personally reviewed the ECGs from this episode (05/22/2022), and it appears to be atrial flutter to me. She was very symptomatic with palpitations during this episode.  She is recently seen by [place] and noted to be in a junctional rhythm.  Flecainide was discontinued and a monitor placed.  The monitor showed that she did continue to have rhythms that it interpreted as junctional, though all but one strip is uninterpretable due to artifact. These episodes were associated with a pounding sensation in her chest.   She has bradycardia, with resting heart rates in the 40's.  She reports that she is able to carry on all the activities she is interested in doing.  She has no limitations.  She denies presyncope, syncope, chest pain, shortness of breath.   Past Medical History:  Diagnosis Date   Atrial fibrillation (HCC)    Dysrhythmia    GAD (generalized anxiety disorder)    Gastroparesis 04/2007   Dr. Stann Mainland   GERD (gastroesophageal reflux disease)    Hearing loss    Osteoporosis     Past Surgical History:  Procedure Laterality Date   COLONOSCOPY  05/20/2011   Dr Laural Golden   COLONOSCOPY N/A 07/08/2021   Procedure: COLONOSCOPY;  Surgeon: Rogene Houston, MD;  [facility];  Service: Endoscopy;  Laterality: N/A;  10:55   TONSILLECTOMY      Current Medications: Current Meds  Medication Sig   Ascorbic Acid (VITAMIN C PO) Take 1,000 mg by  mouth daily.   denosumab (PROLIA) 60 MG/ML SOSY injection Inject 60 mg into the skin every 6 (six) months.   Flaxseed, Linseed, (FLAXSEED OIL) 1200 MG CAPS Take 1,200 mg by mouth 2 (two) times daily.   Multiple Vitamins-Minerals (MULTIVITAMIN WITH MINERALS) tablet Take 1 tablet by mouth daily.   Propylene Glycol (SYSTANE BALANCE) 0.6 % SOLN Place 2 drops into both eyes in the morning and at bedtime. Into both eyes   Rivaroxaban (XARELTO) 15 MG TABS tablet TAKE ONE (1) TABLET BY MOUTH EVERY DAY   sertraline (ZOLOFT) 50 MG tablet 1 qd     Allergies:   Patient has no known allergies.   Social History   Socioeconomic History   Marital status: Widowed    Spouse name: Not on file   Number of children: 2   Years of education: Not on file   Highest education level: Not on file  Occupational History   Not on file  Tobacco Use   Smoking status: Former    Types: Cigarettes    Quit date: 12/20/1979    Years since quitting: 42.9   Smokeless tobacco: Never  Vaping Use   Vaping Use: Never used  Substance and Sexual Activity   Alcohol use: Yes    Alcohol/week: 7.0 standard drinks of alcohol    Types: 7 Glasses of wine per week    Comment: occ   Drug use: Never   Sexual activity:  Yes    Birth control/protection: Post-menopausal  Other Topics Concern   Not on file  Social History Narrative   1 daughter living. 1 daughter died at age 72.   No grandchildren.   Husband passed 06/2021. Married x 24 years.   Social Determinants of Health   Financial Resource Strain: Low Risk  (12/21/2021)   Overall Financial Resource Strain (CARDIA)    Difficulty of Paying Living Expenses: Not hard at all  Food Insecurity: No Food Insecurity (12/21/2021)   Hunger Vital Sign    Worried About Running Out of Food in the Last Year: Never true    Ran Out of Food in the Last Year: Never true  Transportation Needs: No Transportation Needs (12/21/2021)   PRAPARE - Hydrologist (Medical): No     Lack of Transportation (Non-Medical): No  Physical Activity: Sufficiently Active (12/21/2021)   Exercise Vital Sign    Days of Exercise per Week: 6 days    Minutes of Exercise per Session: 50 min  Stress: No Stress Concern Present (12/21/2021)   Pamela Yang    Feeling of Stress : Only a little  Social Connections: Moderately Integrated (12/21/2021)   Social Connection and Isolation Panel [NHANES]    Frequency of Communication with Friends and Family: More than three times a week    Frequency of Social Gatherings with Friends and Family: Three times a week    Attends Religious Services: More than 4 times per year    Active Member of Clubs or Organizations: Yes    Attends Archivist Meetings: More than 4 times per year    Marital Status: Widowed     Family History: The patient's family history includes Coronary artery disease in her brother; Heart attack in her mother; Hypertension in her sister and sister; Osteoporosis in her sister; Ovarian cancer in her sister.  ROS:   Please see the history of present illness.    All other systems reviewed and are negative.  EKGs/Labs/Other Studies Reviewed Today:     Zio patch strips: independently reviewed by me:  Sinus rhythm, HR 39-112 bpm, avg 50 Symptoms of fluttering, skipped beats, lightheadedness were associated with accelerated junctional rhythm -- difficult to determine due to artifact and poor quality. There were episodes of SVT with long-RP, likely atrial tachycardia  EKG:  Last EKG results: today - sinus bradycardia, anteroseptal MI   Recent Labs: 03/17/2022: ALT 28 11/07/2022: BUN 25; Creatinine, Ser 0.80; Hemoglobin 12.3; Magnesium 2.2; Platelets 168; Potassium 4.3; Sodium 142; TSH 1.520     Physical Exam:    VS:  BP (!) 140/82   Pulse (!) 44   Ht '5\' 4"'$  (1.626 m)   Wt 107 lb 9.6 oz (48.8 kg)   SpO2 100%   BMI 18.47 kg/m     Wt Readings from  Last 3 Encounters:  12/02/22 107 lb 9.6 oz (48.8 kg)  11/04/22 107 lb (48.5 kg)  03/28/22 106 lb (48.1 kg)     GEN:  Well nourished, well developed in no acute distress CARDIAC: brady, regular, no murmurs, rubs, gallops RESPIRATORY:  Normal work of breathing MUSCULOSKELETAL: no edema    ASSESSMENT & PLAN:    Atrial flutter: no evidence of recurrence of sustained arrhythmia since 2001, per patient. Flecainide was recently discontinued. We will monitor. If she has recurrence, I would recommend ablation. Continue rivaroxaban. Junctional rhythms: appears improved off of flecainide. She has mild symptoms. I  do not see indication for pacemaker SVT: brief runs detected on device. These were asymptomatic. Underlying mechanism is unclear. Will monitor for now.        Medication Adjustments/Labs and Tests Ordered: Current medicines are reviewed at length with the patient today.  Concerns regarding medicines are outlined above.  Orders Placed This Encounter  Procedures   EKG 12-Lead   No orders of the defined types were placed in this encounter.    Signed, Melida Quitter, MD  12/02/2022 11:24 AM    [place]

## 2022-12-29 NOTE — Progress Notes (Signed)
Subjective:   Pamela Yang is a 72 y.o. female who presents for Medicare Annual (Subsequent) preventive examination.  Review of Systems     Cardiac Risk Factors include: advanced age (>22mn, >>55women);dyslipidemia     Objective:    Today's Vitals   12/30/22 1041  BP: 116/62  Weight: 108 lb 6.4 oz (49.2 kg)  Height: '5\' 4"'$  (1.626 m)   Body mass index is 18.61 kg/m.     12/30/2022   10:41 AM 12/21/2021   10:38 AM 07/08/2021    9:51 AM 05/22/2020    7:29 AM 03/09/2020    5:47 PM 03/07/2016    1:58 PM  Advanced Directives  Does Patient Have a Medical Advance Directive? Yes Yes Yes Yes No;Yes Yes  Type of Advance Directive Living will;Healthcare Power of [place] Living will Healthcare [place] of [place] Does patient want to make changes to medical advance directive? No - Patient declined       Copy of [place] Chart? No - copy requested No - copy requested    Yes    Current Medications (verified) Outpatient Encounter Medications as of 12/30/2022  Medication Sig   Ascorbic Acid (VITAMIN C PO) Take 1,000 mg by mouth daily.   denosumab (PROLIA) 60 MG/ML SOSY injection Inject 60 mg into the skin every 6 (six) months.   Flaxseed, Linseed, (FLAXSEED OIL) 1200 MG CAPS Take 1,200 mg by mouth 2 (two) times daily.   Multiple Vitamins-Minerals (MULTIVITAMIN WITH MINERALS) tablet Take 1 tablet by mouth daily.   Propylene Glycol (SYSTANE BALANCE) 0.6 % SOLN Place 2 drops into both eyes in the morning and at bedtime. Into both eyes   Rivaroxaban (XARELTO) 15 MG TABS tablet TAKE ONE (1) TABLET BY MOUTH EVERY DAY   sertraline (ZOLOFT) 50 MG tablet 1 qd   No facility-administered encounter medications on file as of 12/30/2022.    Allergies (verified) Patient has no known allergies.   History: Past Medical History:  Diagnosis Date   Atrial fibrillation  (HCC)    Dysrhythmia    GAD (generalized anxiety disorder)    Gastroparesis 04/2007   Dr. MStann Mainland  GERD (gastroesophageal reflux disease)    Hearing loss    Osteoporosis    Past Surgical History:  Procedure Laterality Date   COLONOSCOPY  05/20/2011   Dr RLaural Golden  COLONOSCOPY N/A 07/08/2021   Procedure: COLONOSCOPY;  Surgeon: RRogene Houston MD;  [facility];  Service: Endoscopy;  Laterality: N/A;  10:55   TONSILLECTOMY     Family History  Problem Relation Age of Onset   Heart attack Mother    Ovarian cancer Sister    Hypertension Sister    Hypertension Sister    Osteoporosis Sister    Coronary artery disease Brother    Social History   Socioeconomic History   Marital status: Widowed    Spouse name: Not on file   Number of children: 2   Years of education: Not on file   Highest education level: Not on file  Occupational History   Not on file  Tobacco Use   Smoking status: Former    Types: Cigarettes    Quit date: 12/20/1979    Years since quitting: 43.0   Smokeless tobacco: Never  Vaping Use   Vaping Use: Never used  Substance and Sexual Activity   Alcohol use: Yes  Alcohol/week: 7.0 standard drinks of alcohol    Types: 7 Glasses of wine per week    Comment: occ   Drug use: Never   Sexual activity: Yes    Birth control/protection: Post-menopausal  Other Topics Concern   Not on file  Social History Narrative   1 daughter living. 1 daughter died at age 60.   No grandchildren.   Husband passed 06/2021. Married x 24 years.   Social Determinants of Health   Financial Resource Strain: Low Risk  (12/30/2022)   Overall Financial Resource Strain (CARDIA)    Difficulty of Paying Living Expenses: Not hard at all  Food Insecurity: No Food Insecurity (12/30/2022)   Hunger Vital Sign    Worried About Running Out of Food in the Last Year: Never true    Ran Out of Food in the Last Year: Never true  Transportation Needs: No Transportation Needs (12/30/2022)    PRAPARE - Hydrologist (Medical): No    Lack of Transportation (Non-Medical): No  Physical Activity: Sufficiently Active (12/30/2022)   Exercise Vital Sign    Days of Exercise per Week: 7 days    Minutes of Exercise per Session: 150+ min  Stress: No Stress Concern Present (12/30/2022)   [place]    Feeling of Stress : Only a little  Social Connections: Moderately Integrated (12/30/2022)   Social Connection and Isolation Panel [NHANES]    Frequency of Communication with Friends and Family: More than three times a week    Frequency of Social Gatherings with Friends and Family: Three times a week    Attends Religious Services: More than 4 times per year    Active Member of Clubs or Organizations: Yes    Attends Archivist Meetings: More than 4 times per year    Marital Status: Widowed    Tobacco Counseling Counseling given: Not Answered   Clinical Intake:  Pre-visit preparation completed: Yes  Pain : No/denies pain     Diabetes: No  How often do you need to have someone help you when you read instructions, pamphlets, or other written materials from your doctor or pharmacy?: 1 - Never  Diabetic?No   Interpreter Needed?: No  Information entered by :: Denman George LPN   Activities of Daily Living    12/30/2022   10:48 AM 12/26/2022    9:44 AM  In your present state of health, do you have any difficulty performing the following activities:  Hearing? 0 1  Vision? 0 0  Difficulty concentrating or making decisions? 0 0  Walking or climbing stairs? 0 0  Dressing or bathing? 0 0  Doing errands, shopping? 0 0  Preparing Food and eating ? N N  Using the Toilet? N N  In the past six months, have you accidently leaked urine? N Y  Do you have problems with loss of bowel control? N Y  Managing your Medications? N N  Managing your Finances? N N  Housekeeping or managing your  Housekeeping? N N    Patient Care Team: Kathyrn Drown, MD as PCP - General (Family Medicine) Branch, Alphonse Guild, MD as PCP - Cardiology (Cardiology) Mealor, Yetta Barre, MD as PCP - Electrophysiology (Cardiology) Lavonna Monarch, MD (Inactive) as Consulting Physician (Dermatology) Kathyrn Drown, MD as Consulting Physician (Family Medicine) Jacelyn Pi, MD as Referring Physician (Endocrinology)  Indicate any recent Medical Services you may have received from other than [facility] providers in  the past year (date may be approximate).     Assessment:   This is a routine wellness examination for Pamela Yang.  Hearing/Vision screen Hearing Screening - Comments:: Wears hearing aid in right ear-followed by [facility]  Vision Screening - Comments:: Wears rx glasses - up to date with routine eye exams with Dr. Katy Fitch    Dietary issues and exercise activities discussed: Current Exercise Habits: Home exercise routine;Structured exercise class, Type of exercise: walking;stretching;calisthenics, Time (Minutes): > 60, Frequency (Times/Week): 7, Weekly Exercise (Minutes/Week): 0, Intensity: Moderate   Goals Addressed             This Visit's Progress    DIET - INCREASE WATER INTAKE   On track    Would like to increase water and continue to stay in good health. Is currently going to [facility] and taking exercise class. Would like to travel       Depression Screen    12/30/2022   10:47 AM 03/28/2022    2:09 PM 01/21/2022    2:21 PM 12/21/2021   10:34 AM 05/03/2021    3:35 PM 12/10/2020    9:35 AM 11/24/2020    9:23 AM  PHQ 2/9 Scores  PHQ - 2 Score 0 0 0 1 0 4 2  PHQ- 9 Score   0  '2 15 8    '$ Fall Risk    12/30/2022   10:47 AM 12/26/2022    9:44 AM 03/28/2022    2:09 PM 01/21/2022   10:19 AM 12/21/2021   10:38 AM  Fall Risk   Falls in the past year? 0 0 0 0 1  Number falls in past yr: 0 0 0 0 0  Injury with Fall? 0 0 0 0 0  Risk for fall due to : No Fall Risks  No Fall Risks No Fall Risks  History of fall(s);Impaired mobility  Follow up Falls evaluation completed;Education provided;Falls prevention discussed  Falls evaluation completed Falls evaluation completed Falls prevention discussed    FALL RISK PREVENTION PERTAINING TO THE HOME:  Any stairs in or around the home? Yes  If so, are there any without handrails? No  Home free of loose throw rugs in walkways, pet beds, electrical cords, etc? Yes  Adequate lighting in your home to reduce risk of falls? Yes   ASSISTIVE DEVICES UTILIZED TO PREVENT FALLS:  Life alert? No  Use of a cane, walker or w/c? No  Grab bars in the bathroom? Yes  Shower chair or bench in shower? No  Elevated toilet seat or a handicapped toilet? Yes   TIMED UP AND GO:  Was the test performed? Yes .  Length of time to ambulate 10 feet: 5 sec.   Gait steady and fast without use of assistive device  Cognitive Function:        12/30/2022   10:48 AM 12/21/2021   10:42 AM  6CIT Screen  What Year? 0 points 0 points  What month? 0 points 0 points  What time? 0 points 0 points  Count back from 20 0 points 0 points  Months in reverse 0 points 0 points  Repeat phrase 0 points 0 points  Total Score 0 points 0 points    Immunizations Immunization History  Administered Date(s) Administered   Fluad Quad(high Dose 65+) 09/04/2020   Influenza,inj,Quad PF,6+ Mos 08/25/2016, 08/30/2017   Influenza,trivalent, recombinat, inj, PF 09/29/2015   Influenza-Unspecified 08/28/2018, 09/25/2019, 09/18/2021   Moderna SARS-COV2 Booster Vaccination 10/02/2021   Moderna Sars-Covid-2 Vaccination 01/03/2020, 01/31/2020, 08/20/2020  Pneumococcal Conjugate-13 08/21/2015   Pneumococcal Polysaccharide-23 08/13/2014   Unspecified SARS-COV-2 Vaccination 09/27/2022   Zoster Recombinat (Shingrix) 09/09/2019, 02/17/2020   Zoster, Live 08/26/2014    TDAP status: Due, Education has been provided regarding the importance of this vaccine. Advised may receive this vaccine  at local pharmacy or Health Dept. Aware to provide a copy of the vaccination record if obtained from local pharmacy or Health Dept. Verbalized acceptance and understanding.  Flu Vaccine status: Up to date  Pneumococcal vaccine status: Up to date  Covid-19 vaccine status: Information provided on how to obtain vaccines.   Qualifies for Shingles Vaccine? Yes   Zostavax completed Yes   Shingrix Completed?: Yes  Screening Tests Health Maintenance  Topic Date Due   DTaP/Tdap/Td (1 - Tdap) Never done   COVID-19 Vaccine (5 - 2023-24 season) 11/22/2022   Medicare Annual Wellness (AWV)  12/31/2023   MAMMOGRAM  02/22/2024   Pneumonia Vaccine 30+ Years old  Completed   INFLUENZA VACCINE  Completed   DEXA SCAN  Completed   Hepatitis C Screening  Completed   Zoster Vaccines- Shingrix  Completed   HPV VACCINES  Aged Out   COLONOSCOPY (Pts 45-34yr Insurance coverage will need to be confirmed)  Discontinued    Health Maintenance  Health Maintenance Due  Topic Date Due   DTaP/Tdap/Td (1 - Tdap) Never done   COVID-19 Vaccine (5 - 2023-24 season) 11/22/2022    Colorectal cancer screening: No longer required.   Mammogram status: Completed 02/21/22. Repeat every year  Bone Density status: Completed 08/26/21. Results reflect: Bone density results: OSTEOPOROSIS. Repeat every 2 years.  Lung Cancer Screening: (Low Dose CT Chest recommended if Age 74-80years, 30 pack-year currently smoking OR have quit w/in 15years.) does not qualify.   Lung Cancer Screening Referral: n/a  Additional Screening:  Hepatitis C Screening: does qualify; Completed 06/02/21  Vision Screening: Recommended annual ophthalmology exams for early detection of glaucoma and other disorders of the eye. Is the patient up to date with their annual eye exam?  Yes  Who is the provider or what is the name of the office in which the patient attends annual eye exams? Dr. GKaty Fitch If pt is not established with a provider, would they  like to be referred to a provider to establish care? No .   Dental Screening: Recommended annual dental exams for proper oral hygiene  Community Resource Referral / Chronic Care Management: CRR required this visit?  No   CCM required this visit?  No      Plan:     I have personally reviewed and noted the following in the patient's chart:   Medical and social history Use of alcohol, tobacco or illicit drugs  Current medications and supplements including opioid prescriptions. Patient is not currently taking opioid prescriptions. Functional ability and status Nutritional status Physical activity Advanced directives List of other physicians Hospitalizations, surgeries, and ER visits in previous 12 months Vitals Screenings to include cognitive, depression, and falls Referrals and appointments  In addition, I have reviewed and discussed with patient certain preventive protocols, quality metrics, and best practice recommendations. A written personalized care plan for preventive services as well as general preventive health recommendations were provided to patient.     SDenman GeorgeBPanola [place]  11/61/0960  Nurse Notes: No concerns

## 2022-12-29 NOTE — Patient Instructions (Signed)
Pamela Yang , Thank you for taking time to come for your Medicare Wellness Visit. I appreciate your ongoing commitment to your health goals. Please review the following plan we discussed and let me know if I can assist you in the future.   These are the goals we discussed:  Goals      DIET - INCREASE WATER INTAKE     Would like to increase water and continue to stay in good health. Is currently going to [facility] and taking exercise class. Would like to travel        This is a list of the screening recommended for you and due dates:  Health Maintenance  Topic Date Due   DTaP/Tdap/Td vaccine (1 - Tdap) Never done   COVID-19 Vaccine (5 - 2023-24 season) 11/22/2022   Medicare Annual Wellness Visit  12/31/2023   Mammogram  02/22/2024   Pneumonia Vaccine  Completed   Flu Shot  Completed   DEXA scan (bone density measurement)  Completed   Hepatitis C Screening: USPSTF Recommendation to screen - Ages 18-79 yo.  Completed   Zoster (Shingles) Vaccine  Completed   HPV Vaccine  Aged Out   Colon Cancer Screening  Discontinued    Advanced directives: Please bring a copy of your health care power of attorney and living will to the office to be added to your chart at your convenience.   Conditions/risks identified: Aim for 30 minutes of exercise or brisk walking, 6-8 glasses of water, and 5 servings of fruits and vegetables each day.   Next appointment: Follow up in one year for your annual wellness visit    Preventive Care 65 Years and Older, Female Preventive care refers to lifestyle choices and visits with your health care provider that can promote health and wellness. What does preventive care include? A yearly physical exam. This is also called an annual well check. Dental exams once or twice a year. Routine eye exams. Ask your health care provider how often you should have your eyes checked. Personal lifestyle choices, including: Daily care of your teeth and gums. Regular physical  activity. Eating a healthy diet. Avoiding tobacco and drug use. Limiting alcohol use. Practicing safe sex. Taking low-dose aspirin every day. Taking vitamin and mineral supplements as recommended by your health care provider. What happens during an annual well check? The services and screenings done by your health care provider during your annual well check will depend on your age, overall health, lifestyle risk factors, and family history of disease. Counseling  Your health care provider may ask you questions about your: Alcohol use. Tobacco use. Drug use. Emotional well-being. Home and relationship well-being. Sexual activity. Eating habits. History of falls. Memory and ability to understand (cognition). Work and work Statistician. Reproductive health. Screening  You may have the following tests or measurements: Height, weight, and BMI. Blood pressure. Lipid and cholesterol levels. These may be checked every 5 years, or more frequently if you are over 67 years old. Skin check. Lung cancer screening. You may have this screening every year starting at age 51 if you have a 30-pack-year history of smoking and currently smoke or have quit within the past 15 years. Fecal occult blood test (FOBT) of the stool. You may have this test every year starting at age 51. Flexible sigmoidoscopy or colonoscopy. You may have a sigmoidoscopy every 5 years or a colonoscopy every 10 years starting at age 52. Hepatitis C blood test. Hepatitis B blood test. Sexually transmitted disease (STD) testing.  Diabetes screening. This is done by checking your blood sugar (glucose) after you have not eaten for a while (fasting). You may have this done every 1-3 years. Bone density scan. This is done to screen for osteoporosis. You may have this done starting at age 28. Mammogram. This may be done every 1-2 years. Talk to your health care provider about how often you should have regular mammograms. Talk with your  health care provider about your test results, treatment options, and if necessary, the need for more tests. Vaccines  Your health care provider may recommend certain vaccines, such as: Influenza vaccine. This is recommended every year. Tetanus, diphtheria, and acellular pertussis (Tdap, Td) vaccine. You may need a Td booster every 10 years. Zoster vaccine. You may need this after age 46. Pneumococcal 13-valent conjugate (PCV13) vaccine. One dose is recommended after age 18. Pneumococcal polysaccharide (PPSV23) vaccine. One dose is recommended after age 60. Talk to your health care provider about which screenings and vaccines you need and how often you need them. This information is not intended to replace advice given to you by your health care provider. Make sure you discuss any questions you have with your health care provider. Document Released: 01/01/2016 Document Revised: 08/24/2016 Document Reviewed: 10/06/2015 Elsevier Interactive Patient Education  2017 [employer] Prevention in the Home Falls can cause injuries. They can happen to people of all ages. There are many things you can do to make your home safe and to help prevent falls. What can I do on the outside of my home? Regularly fix the edges of walkways and driveways and fix any cracks. Remove anything that might make you trip as you walk through a door, such as a raised step or threshold. Trim any bushes or trees on the path to your home. Use bright outdoor lighting. Clear any walking paths of anything that might make someone trip, such as rocks or tools. Regularly check to see if handrails are loose or broken. Make sure that both sides of any steps have handrails. Any raised decks and porches should have guardrails on the edges. Have any leaves, snow, or ice cleared regularly. Use sand or salt on walking paths during winter. Clean up any spills in your garage right away. This includes oil or grease spills. What can I  do in the bathroom? Use night lights. Install grab bars by the toilet and in the tub and shower. Do not use towel bars as grab bars. Use non-skid mats or decals in the tub or shower. If you need to sit down in the shower, use a plastic, non-slip stool. Keep the floor dry. Clean up any water that spills on the floor as soon as it happens. Remove soap buildup in the tub or shower regularly. Attach bath mats securely with double-sided non-slip rug tape. Do not have throw rugs and other things on the floor that can make you trip. What can I do in the bedroom? Use night lights. Make sure that you have a light by your bed that is easy to reach. Do not use any sheets or blankets that are too big for your bed. They should not hang down onto the floor. Have a firm chair that has side arms. You can use this for support while you get dressed. Do not have throw rugs and other things on the floor that can make you trip. What can I do in the kitchen? Clean up any spills right away. Avoid walking on wet floors.  Keep items that you use a lot in easy-to-reach places. If you need to reach something above you, use a strong step stool that has a grab bar. Keep electrical cords out of the way. Do not use floor polish or wax that makes floors slippery. If you must use wax, use non-skid floor wax. Do not have throw rugs and other things on the floor that can make you trip. What can I do with my stairs? Do not leave any items on the stairs. Make sure that there are handrails on both sides of the stairs and use them. Fix handrails that are broken or loose. Make sure that handrails are as long as the stairways. Check any carpeting to make sure that it is firmly attached to the stairs. Fix any carpet that is loose or worn. Avoid having throw rugs at the top or bottom of the stairs. If you do have throw rugs, attach them to the floor with carpet tape. Make sure that you have a light switch at the top of the stairs  and the bottom of the stairs. If you do not have them, ask someone to add them for you. What else can I do to help prevent falls? Wear shoes that: Do not have high heels. Have rubber bottoms. Are comfortable and fit you well. Are closed at the toe. Do not wear sandals. If you use a stepladder: Make sure that it is fully opened. Do not climb a closed stepladder. Make sure that both sides of the stepladder are locked into place. Ask someone to hold it for you, if possible. Clearly mark and make sure that you can see: Any grab bars or handrails. First and last steps. Where the edge of each step is. Use tools that help you move around (mobility aids) if they are needed. These include: Canes. Walkers. Scooters. Crutches. Turn on the lights when you go into a dark area. Replace any light bulbs as soon as they burn out. Set up your furniture so you have a clear path. Avoid moving your furniture around. If any of your floors are uneven, fix them. If there are any pets around you, be aware of where they are. Review your medicines with your doctor. Some medicines can make you feel dizzy. This can increase your chance of falling. Ask your doctor what other things that you can do to help prevent falls. This information is not intended to replace advice given to you by your health care provider. Make sure you discuss any questions you have with your health care provider. Document Released: 10/01/2009 Document Revised: 05/12/2016 Document Reviewed: 01/09/2015 Elsevier Interactive Patient Education  2017 [employer].

## 2022-12-30 ENCOUNTER — Ambulatory Visit (INDEPENDENT_AMBULATORY_CARE_PROVIDER_SITE_OTHER): Payer: Medicare Other

## 2022-12-30 VITALS — BP 116/62 | Ht 64.0 in | Wt 108.4 lb

## 2022-12-30 DIAGNOSIS — Z0000 Encounter for general adult medical examination without abnormal findings: Secondary | ICD-10-CM

## 2022-12-30 DIAGNOSIS — Z1231 Encounter for screening mammogram for malignant neoplasm of breast: Secondary | ICD-10-CM

## 2023-01-13 ENCOUNTER — Encounter: Payer: Self-pay | Admitting: Nurse Practitioner

## 2023-01-21 ENCOUNTER — Other Ambulatory Visit: Payer: Self-pay | Admitting: Nurse Practitioner

## 2023-01-21 DIAGNOSIS — R7989 Other specified abnormal findings of blood chemistry: Secondary | ICD-10-CM

## 2023-01-21 DIAGNOSIS — Z13220 Encounter for screening for lipoid disorders: Secondary | ICD-10-CM

## 2023-01-21 DIAGNOSIS — Z0000 Encounter for general adult medical examination without abnormal findings: Secondary | ICD-10-CM

## 2023-01-24 DIAGNOSIS — Z13220 Encounter for screening for lipoid disorders: Secondary | ICD-10-CM | POA: Diagnosis not present

## 2023-01-24 DIAGNOSIS — R7989 Other specified abnormal findings of blood chemistry: Secondary | ICD-10-CM | POA: Diagnosis not present

## 2023-01-24 DIAGNOSIS — Z0000 Encounter for general adult medical examination without abnormal findings: Secondary | ICD-10-CM | POA: Diagnosis not present

## 2023-01-25 LAB — COMPREHENSIVE METABOLIC PANEL
ALT: 21 IU/L (ref 0–32)
AST: 27 IU/L (ref 0–40)
Albumin/Globulin Ratio: 2 (ref 1.2–2.2)
Albumin: 4.3 g/dL (ref 3.8–4.8)
Alkaline Phosphatase: 60 IU/L (ref 44–121)
BUN/Creatinine Ratio: 18 (ref 12–28)
BUN: 14 mg/dL (ref 8–27)
Bilirubin Total: 0.6 mg/dL (ref 0.0–1.2)
CO2: 24 mmol/L (ref 20–29)
Calcium: 9.9 mg/dL (ref 8.7–10.3)
Chloride: 103 mmol/L (ref 96–106)
Creatinine, Ser: 0.78 mg/dL (ref 0.57–1.00)
Globulin, Total: 2.1 g/dL (ref 1.5–4.5)
Glucose: 84 mg/dL (ref 70–99)
Potassium: 4.1 mmol/L (ref 3.5–5.2)
Sodium: 141 mmol/L (ref 134–144)
Total Protein: 6.4 g/dL (ref 6.0–8.5)
eGFR: 80 mL/min/{1.73_m2} (ref >59)

## 2023-01-25 LAB — LIPID PANEL
Chol/HDL Ratio: 2.7 ratio (ref 0.0–4.4)
Cholesterol, Total: 184 mg/dL (ref 100–199)
HDL: 69 mg/dL (ref >39)
LDL Chol Calc (NIH): 104 mg/dL — ABNORMAL HIGH (ref 0–99)
Triglycerides: 56 mg/dL (ref 0–149)
VLDL Cholesterol Cal: 11 mg/dL (ref 5–40)

## 2023-01-27 ENCOUNTER — Ambulatory Visit (INDEPENDENT_AMBULATORY_CARE_PROVIDER_SITE_OTHER): Payer: Medicare Other | Admitting: Nurse Practitioner

## 2023-01-27 ENCOUNTER — Encounter: Payer: Medicare Other | Admitting: Family Medicine

## 2023-01-27 VITALS — BP 100/62 | HR 59 | Ht 64.0 in | Wt 105.0 lb

## 2023-01-27 DIAGNOSIS — Z0000 Encounter for general adult medical examination without abnormal findings: Secondary | ICD-10-CM

## 2023-01-27 DIAGNOSIS — F418 Other specified anxiety disorders: Secondary | ICD-10-CM

## 2023-01-27 DIAGNOSIS — Z0001 Encounter for general adult medical examination with abnormal findings: Secondary | ICD-10-CM | POA: Diagnosis not present

## 2023-01-27 DIAGNOSIS — Z23 Encounter for immunization: Secondary | ICD-10-CM | POA: Diagnosis not present

## 2023-01-27 MED ORDER — SERTRALINE HCL 50 MG PO TABS: ORAL_TABLET | ORAL | 3 refills | Status: DC

## 2023-01-27 NOTE — Progress Notes (Signed)
   Subjective:    Patient ID: Pamela Yang, female    DOB: 1951-09-07, 72 y.o.   MRN: [id number]  HPI Pamela Yang is being seen in the office today for a physical. Her diet is good. She does exercise regularly, walking 20-25 miles per week. She also attends group exercise class 2-4 times per week. She does not smoke, vape, drink alcohol, or participate in drug use. She is post-menopausal, and no longer has menstrual cycles. She is not currently sexually active.    Review of Systems     Objective:   Physical Exam        Assessment & Plan:

## 2023-01-27 NOTE — Progress Notes (Unsigned)
   Subjective:    Patient ID: Pamela Yang, female    DOB: August 23, 1951, 72 y.o.   MRN: [id number]  HPI The patient comes in today for a wellness visit.    A review of their health history was completed.  A review of medications was also completed.  Any needed refills; yes  Eating habits: good  Falls/  MVA accidents in past few months: no  Regular exercise: walking  Specialist pt sees on regular basis: endocrinologist, cardiologist  Preventative health issues were discussed.   Additional concerns: no concerns or issues today     Review of Systems     Objective:   Physical Exam        Assessment & Plan:

## 2023-01-27 NOTE — Progress Notes (Unsigned)
Subjective:    Patient ID: Pamela Yang, female    DOB: Jul 06, 1951, 72 y.o.   MRN: [id number]  HPI [place] is being seen in the office today for a physical. Her diet is good. She does exercise regularly, walking 20-25 miles per week. She also attends group exercise class 2-4 times per week. She does not smoke, vape, drink alcohol, or participate in drug use. She is post-menopausal, and no longer has menstrual cycles. She is not currently sexually active. She is currently up to date on health screenings. She does get regular dental and eye exams.    Review of Systems  Constitutional:  Negative for fatigue and fever.  HENT:  Negative for sore throat and trouble swallowing.   Respiratory:  Negative for cough, chest tightness, shortness of breath and wheezing.   Cardiovascular:  Negative for chest pain.  Gastrointestinal:  Negative for constipation, diarrhea, nausea and vomiting.  Genitourinary:  Negative for difficulty urinating, dysuria, pelvic pain, vaginal bleeding and vaginal discharge.  Neurological:  Negative for dizziness and weakness.  Psychiatric/Behavioral:  Negative for sleep disturbance.       Objective:   Physical Exam Vitals and nursing note reviewed.  Constitutional:      Appearance: Normal appearance. She is normal weight. She is not ill-appearing.  HENT:     Nose: No congestion or rhinorrhea.  Neck:     Thyroid: No thyroid mass or thyroid tenderness.  Cardiovascular:     Rate and Rhythm: Regular rhythm.     Heart sounds: Normal heart sounds.  Pulmonary:     Effort: Pulmonary effort is normal. No respiratory distress.     Comments: Faint wheezes heart in the lung bases.  Chest:  Breasts:    Breasts are symmetrical.     Comments: Breasts appear to be normal with no nodularities, masses, tenderness, or nipple changes.  Abdominal:     General: There is no distension.     Palpations: Abdomen is soft. There is no mass.     Tenderness: There is no abdominal  tenderness.  Lymphadenopathy:     Cervical: No cervical adenopathy.  Skin:    General: Skin is warm and dry.  Neurological:     Mental Status: She is alert.  Psychiatric:        Mood and Affect: Mood normal.        Behavior: Behavior normal.        Thought Content: Thought content normal.        Judgment: Judgment normal.    Vitals:   01/27/23 0957  BP: 100/62  Pulse: (!) 59  Height: 5' 4"$  (1.626 m)  Weight: 105 lb (47.6 kg)  SpO2: 99%  BMI (Calculated): 18.01    Results for orders placed or performed in visit on 01/21/23  Comprehensive metabolic panel  Result Value Ref Range   Glucose 84 70 - 99 mg/dL   BUN 14 8 - 27 mg/dL   Creatinine, Ser 0.78 0.57 - 1.00 mg/dL   eGFR 80 >59 mL/min/1.73   BUN/Creatinine Ratio 18 12 - 28   Sodium 141 134 - 144 mmol/L   Potassium 4.1 3.5 - 5.2 mmol/L   Chloride 103 96 - 106 mmol/L   CO2 24 20 - 29 mmol/L   Calcium 9.9 8.7 - 10.3 mg/dL   Total Protein 6.4 6.0 - 8.5 g/dL   Albumin 4.3 3.8 - 4.8 g/dL   Globulin, Total 2.1 1.5 - 4.5 g/dL   Albumin/Globulin Ratio 2.0 1.2 -  2.2   Bilirubin Total 0.6 0.0 - 1.2 mg/dL   Alkaline Phosphatase 60 44 - 121 IU/L   AST 27 0 - 40 IU/L   ALT 21 0 - 32 IU/L  Lipid panel  Result Value Ref Range   Cholesterol, Total 184 100 - 199 mg/dL   Triglycerides 56 0 - 149 mg/dL   HDL 69 >39 mg/dL   VLDL Cholesterol Cal 11 5 - 40 mg/dL   LDL Chol Calc (NIH) 104 (H) 0 - 99 mg/dL   Chol/HDL Ratio 2.7 0.0 - 4.4 ratio         01/27/2023    9:59 AM 12/30/2022   10:47 AM 03/28/2022    2:09 PM 01/21/2022    2:21 PM 12/21/2021   10:34 AM  Depression screen PHQ 2/9  Decreased Interest 0 0 0 0 0  Down, Depressed, Hopeless 0 0 0 0 1  PHQ - 2 Score 0 0 0 0 1  Altered sleeping    0   Tired, decreased energy    0   Change in appetite    0   Feeling bad or failure about yourself     0   Trouble concentrating    0   Moving slowly or fidgety/restless    0   Suicidal thoughts    0   PHQ-9 Score    0   Difficult doing  work/chores    Not difficult at all        01/27/2023    9:59 AM 01/21/2022   10:58 AM 12/10/2020    9:37 AM 03/09/2020    3:53 PM  GAD 7 : Generalized Anxiety Score  Nervous, Anxious, on Edge 0 0 3 1  Control/stop worrying 0 0 2 1  Worry too much - different things 0 0 2 1  Trouble relaxing 0 0 2 2  Restless 0 0 3 2  Easily annoyed or irritable 0 0 3 1  Afraid - awful might happen 0 0 0 0  Total GAD 7 Score 0 0 15 8  Anxiety Difficulty   Very difficult Somewhat difficult   The 10-year ASCVD risk score (Arnett DK, et al., 2019) is: 8%   Values used to calculate the score:     Age: 18 years     Sex: Female     Is Non-Hispanic African American: No     Diabetic: No     Tobacco smoker: No     Systolic Blood Pressure: 123XX123 mmHg     Is BP treated: No     HDL Cholesterol: 69 mg/dL     Total Cholesterol: 184 mg/dL   Assessment & Plan:  1. Need for vaccination - Tdap vaccine greater than or equal to 7yo IM  2. Well woman exam (no gynecological exam) Reviewed lab results and vitals with patient. Educated her to continue with a good diet and exercise as tolerated.    Meds ordered this encounter  Medications   sertraline (ZOLOFT) 50 MG tablet    Sig: 1 by mouth PO QD    Dispense:  90 tablet    Refill:  3   Return in about 1 year (around 01/28/2024) for physical.

## 2023-01-28 ENCOUNTER — Encounter: Payer: Self-pay | Admitting: Nurse Practitioner

## 2023-02-06 ENCOUNTER — Other Ambulatory Visit: Payer: Self-pay

## 2023-02-06 ENCOUNTER — Encounter (HOSPITAL_COMMUNITY): Payer: Self-pay

## 2023-02-06 ENCOUNTER — Emergency Department (HOSPITAL_COMMUNITY)
Admission: EM | Admit: 2023-02-06 | Discharge: 2023-02-06 | Disposition: A | Discharge status: Home / Self Care | Payer: Medicare Other | Attending: Emergency Medicine | Admitting: Emergency Medicine

## 2023-02-06 ENCOUNTER — Emergency Department (HOSPITAL_COMMUNITY): Payer: Medicare Other

## 2023-02-06 DIAGNOSIS — R002 Palpitations: Secondary | ICD-10-CM | POA: Diagnosis not present

## 2023-02-06 DIAGNOSIS — R000 Tachycardia, unspecified: Secondary | ICD-10-CM | POA: Diagnosis not present

## 2023-02-06 DIAGNOSIS — I4719 Other supraventricular tachycardia: Secondary | ICD-10-CM | POA: Diagnosis not present

## 2023-02-06 DIAGNOSIS — M810 Age-related osteoporosis without current pathological fracture: Secondary | ICD-10-CM | POA: Diagnosis not present

## 2023-02-06 DIAGNOSIS — Z87891 Personal history of nicotine dependence: Secondary | ICD-10-CM | POA: Diagnosis not present

## 2023-02-06 LAB — CBC
HCT: 40.8 % (ref 36.0–46.0)
Hemoglobin: 13.1 g/dL (ref 12.0–15.0)
MCH: 27.8 pg (ref 26.0–34.0)
MCHC: 32.1 g/dL (ref 30.0–36.0)
MCV: 86.6 fL (ref 80.0–100.0)
Platelets: 173 10*3/uL (ref 150–400)
RBC: 4.71 MIL/uL (ref 3.87–5.11)
RDW: 14 % (ref 11.5–15.5)
WBC: 6.5 10*3/uL (ref 4.0–10.5)
nRBC: 0 % (ref 0.0–0.2)

## 2023-02-06 LAB — COMPREHENSIVE METABOLIC PANEL
ALT: 23 U/L (ref 0–44)
AST: 33 U/L (ref 15–41)
Albumin: 4.4 g/dL (ref 3.5–5.0)
Alkaline Phosphatase: 72 U/L (ref 38–126)
Anion gap: 8 (ref 5–15)
BUN: 21 mg/dL (ref 8–23)
CO2: 25 mmol/L (ref 22–32)
Calcium: 9.8 mg/dL (ref 8.9–10.3)
Chloride: 104 mmol/L (ref 98–111)
Creatinine, Ser: 0.81 mg/dL (ref 0.44–1.00)
GFR, Estimated: >60 mL/min (ref >60)
Glucose, Bld: 123 mg/dL — ABNORMAL HIGH (ref 70–99)
Potassium: 3.7 mmol/L (ref 3.5–5.1)
Sodium: 137 mmol/L (ref 135–145)
Total Bilirubin: 0.6 mg/dL (ref 0.3–1.2)
Total Protein: 7.5 g/dL (ref 6.5–8.1)

## 2023-02-06 LAB — MAGNESIUM: Magnesium: 2.1 mg/dL (ref 1.7–2.4)

## 2023-02-06 LAB — TROPONIN I (HIGH SENSITIVITY)
Troponin I (High Sensitivity): 3 ng/L (ref <18)
Troponin I (High Sensitivity): 6 ng/L (ref <18)

## 2023-02-06 MED ORDER — METOPROLOL TARTRATE 5 MG/5ML IV SOLN
5.0000 mg | Freq: Once | INTRAVENOUS | Status: AC
  Administered 2023-02-06: 5 mg via INTRAVENOUS
  Filled 2023-02-06: qty 5

## 2023-02-06 MED ORDER — METOPROLOL TARTRATE 25 MG PO TABS: 25.0000 mg | ORAL_TABLET | Freq: Two times a day (BID) | ORAL | 0 refills | Status: DC | PRN

## 2023-02-06 MED ORDER — SODIUM CHLORIDE 0.9 % IV BOLUS
500.0000 mL | Freq: Once | INTRAVENOUS | Status: AC
  Administered 2023-02-06: 500 mL via INTRAVENOUS

## 2023-02-06 NOTE — ED Triage Notes (Signed)
Pt presents with afib that started 2 hrs ago. Pt with history but has been controlled for 2 years. Pt states heart feels funny and fast. Endorses weakness and hyperventilation.

## 2023-02-06 NOTE — Discharge Instructions (Signed)
You were evaluated in the Emergency Department and after careful evaluation, we did not find any emergent condition requiring admission or further testing in the hospital.  Your exam/testing today is overall reassuring.  Your symptoms seem to be due to an ectopic atrial tachycardia, which is an abnormal heart rhythm.  You responded well to metoprolol here in the emergency department.  Use the metoprolol tablets provided only as needed at home if you experience the same symptoms.  If the tablet does not work at home after 30 minutes to an hour, recommend repeat evaluation here in the emergency department.  Recommend close follow-up with your cardiologist.  Please return to the Emergency Department if you experience any worsening of your condition.   Thank you for allowing [place] to be a part of your care.

## 2023-02-06 NOTE — ED Provider Notes (Signed)
[facility] Emergency Department Provider Note MRN:  [id number]  Arrival date & time: 02/06/23     Chief Complaint   Irregular Heart Beat   History of Present Illness   Pamela Yang is a 72 y.o. year-old female with a history of A-fib presenting to the ED with chief complaint of irregular heartbeat.  About 2 hours prior to arrival patient felt palpitations, lightheadedness, diaphoresis, rapid breathing.  Denies any chest pain.  Felt similar to prior episodes of A-fib but has been a few years since it occurred.  Used to be on flecainide but not for the past few years.  Review of Systems  A thorough review of systems was obtained and all systems are negative except as noted in the HPI and PMH.   Patient's Health History    Past Medical History:  Diagnosis Date   Atrial fibrillation (HCC)    Dysrhythmia    GAD (generalized anxiety disorder)    Gastroparesis 04/2007   Dr. Stann Mainland   GERD (gastroesophageal reflux disease)    Hearing loss    Osteoporosis     Past Surgical History:  Procedure Laterality Date   COLONOSCOPY  05/20/2011   Dr Laural Golden   COLONOSCOPY N/A 07/08/2021   Procedure: COLONOSCOPY;  Surgeon: Rogene Houston, MD;  [facility];  Service: Endoscopy;  Laterality: N/A;  10:55   TONSILLECTOMY      Family History  Problem Relation Age of Onset   Heart attack Mother    Ovarian cancer Sister    Hypertension Sister    Hypertension Sister    Osteoporosis Sister    Coronary artery disease Brother     Social History   Socioeconomic History   Marital status: Widowed    Spouse name: Not on file   Number of children: 2   Years of education: Not on file   Highest education level: Not on file  Occupational History   Not on file  Tobacco Use   Smoking status: Former    Types: Cigarettes    Quit date: 12/20/1979    Years since quitting: 43.1   Smokeless tobacco: Never  Vaping Use   Vaping Use: Never used  Substance and  Sexual Activity   Alcohol use: Yes    Alcohol/week: 7.0 standard drinks of alcohol    Types: 7 Glasses of wine per week    Comment: occ   Drug use: Never   Sexual activity: Yes    Birth control/protection: Post-menopausal  Other Topics Concern   Not on file  Social History Narrative   1 daughter living. 1 daughter died at age 54.   No grandchildren.   Husband passed 06/2021. Married x 24 years.   Social Determinants of Health   Financial Resource Strain: Low Risk  (12/30/2022)   Overall Financial Resource Strain (CARDIA)    Difficulty of Paying Living Expenses: Not hard at all  Food Insecurity: No Food Insecurity (12/30/2022)   Hunger Vital Sign    Worried About Running Out of Food in the Last Year: Never true    Ran Out of Food in the Last Year: Never true  Transportation Needs: No Transportation Needs (12/30/2022)   PRAPARE - Hydrologist (Medical): No    Lack of Transportation (Non-Medical): No  Physical Activity: Sufficiently Active (12/30/2022)   Exercise Vital Sign    Days of Exercise per Week: 7 days    Minutes of Exercise per Session: 150+ min  Stress: No Stress Concern Present (12/30/2022)   [place]    Feeling of Stress : Only a little  Social Connections: Moderately Integrated (12/30/2022)   Social Connection and Isolation Panel [NHANES]    Frequency of Communication with Friends and Family: More than three times a week    Frequency of Social Gatherings with Friends and Family: Three times a week    Attends Religious Services: More than 4 times per year    Active Member of Clubs or Organizations: Yes    Attends Archivist Meetings: More than 4 times per year    Marital Status: Widowed  Intimate Partner Violence: Not At Risk (12/30/2022)   Humiliation, Afraid, Rape, and Kick questionnaire    Fear of Current or Ex-Partner: No    Emotionally Abused: No    Physically  Abused: No    Sexually Abused: No     Physical Exam   Vitals:   02/06/23 0400 02/06/23 0413  BP: 111/64   Pulse: (!) 45 (!) 46  Resp: 13 18  Temp:    SpO2: 100% 100%    CONSTITUTIONAL: Well-appearing, NAD NEURO/PSYCH:  Alert and oriented x 3, no focal deficits EYES:  eyes equal and reactive ENT/NECK:  no LAD, no JVD CARDIO: Regular rate, well-perfused, normal S1 and S2 PULM:  CTAB no wheezing or rhonchi GI/GU:  non-distended, non-tender MSK/SPINE:  No gross deformities, no edema SKIN:  no rash, atraumatic   *Additional and/or pertinent findings included in MDM below  Diagnostic and Interventional Summary    EKG Interpretation  Date/Time:  Monday February 06 2023 01:29:17 EST Ventricular Rate:  110 PR Interval:  117 QRS Duration: 107 QT Interval:  366 QTC Calculation: 496 R Axis:   -54 Text Interpretation: Ectopic atrial tachycardia, unifocal LAD, consider left anterior fascicular block Probable anterior infarct, age indeterminate Confirmed by Gerlene Fee [phone]) on 02/06/2023 1:59:37 AM       Labs Reviewed  COMPREHENSIVE METABOLIC PANEL - Abnormal; Notable for the following components:      Result Value   Glucose, Bld 123 (*)    All other components within normal limits  CBC  MAGNESIUM  TROPONIN I (HIGH SENSITIVITY)  TROPONIN I (HIGH SENSITIVITY)    DG Chest Port 1 View  Final Result      Medications  sodium chloride 0.9 % bolus 500 mL (0 mLs Intravenous Stopped 02/06/23 0225)  metoprolol tartrate (LOPRESSOR) injection 5 mg (5 mg Intravenous Given 02/06/23 0223)     Procedures  /  Critical Care Procedures  ED Course and Medical Decision Making  Initial Impression and Ddx Palpitations, patient arriving with tachycardia, seems to be more of an ectopic atrial tachycardia, not A-fib.  Case discussed with cardiology, will trial a dose of an AV nodal blocker and reassess.  Given the symptoms of shortness of breath, dizziness, diaphoresis, ACS is also  considered, will check a troponin.  Doubt PE.  Especially given patient's anticoagulant use.  Past medical/surgical history that increases complexity of ED encounter: A-fib  Interpretation of Diagnostics I personally reviewed the EKG and my interpretation is as follows: Ectopic atrial tachycardia  Labs reassuring with no significant blood count or electrolyte disturbance, troponin negative x 2  Patient Reassessment and Ultimate Disposition/Management     Patient converted to sinus rhythm after the single dose of metoprolol.  She is now back to her baseline heart rate, which is in the 40s.  She feels well, no symptoms.  Appropriate for discharge, will follow-up with her cardiology team.   Patient management required discussion with the following services or consulting groups:  Cardiology  Complexity of Problems Addressed Acute illness or injury that poses threat of life of bodily function  Additional Data Reviewed and Analyzed Further history obtained from: Further history from spouse/family member  Additional Factors Impacting ED Encounter Risk Consideration of hospitalization  Barth Kirks. [place] Small, [place] [email or web address]  Final Clinical Impressions(s) / ED Diagnoses     ICD-10-CM   1. Palpitations  R00.2     2. 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  Your exam/testing today is overall reassuring.  Your symptoms  seem to be due to an ectopic atrial tachycardia, which is an abnormal heart rhythm.  You responded well to metoprolol here in the emergency department.  Use the metoprolol tablets provided only as needed at home if you experience the same symptoms.  If the tablet does not work at home after 30 minutes to an hour, recommend repeat evaluation here in the emergency department.  Recommend close follow-up with your cardiologist.  Please return to the Emergency Department if you experience any worsening of your condition.   Thank you for allowing [place] to be a part of your care.       Maudie Flakes, MD 02/06/23 [phone]

## 2023-02-09 ENCOUNTER — Telehealth: Payer: Self-pay

## 2023-02-09 NOTE — Telephone Encounter (Signed)
     Patient  visit on 2/19  at [place] you been able to follow up with your primary care physician? Yes   The patient was or was not able to obtain any needed medicine or equipment. Yes   Are there diet recommendations that you are having difficulty following? Na   Patient expresses understanding of discharge instructions and education provided has no other needs at this time. Yes     [facility] [phone] [address], [place], [place] [address] Phone: [phone] Email: [email or web address]

## 2023-02-20 ENCOUNTER — Encounter: Payer: Self-pay | Admitting: Cardiology

## 2023-02-20 NOTE — Telephone Encounter (Signed)
Can push our appt back 6 months  Zandra Abts MD

## 2023-02-24 ENCOUNTER — Ambulatory Visit (HOSPITAL_COMMUNITY)
Admission: RE | Admit: 2023-02-24 | Discharge: 2023-02-24 | Disposition: A | Discharge status: Home / Self Care | Payer: Medicare Other | Source: Ambulatory Visit | Attending: Family Medicine | Admitting: Family Medicine

## 2023-02-24 ENCOUNTER — Ambulatory Visit: Payer: Medicare Other | Admitting: Cardiology

## 2023-02-24 DIAGNOSIS — Z1231 Encounter for screening mammogram for malignant neoplasm of breast: Secondary | ICD-10-CM | POA: Insufficient documentation

## 2023-02-27 ENCOUNTER — Ambulatory Visit: Payer: Medicare Other | Attending: Cardiovascular Disease | Admitting: Cardiovascular Disease

## 2023-02-27 ENCOUNTER — Encounter: Payer: Self-pay | Admitting: Cardiovascular Disease

## 2023-02-27 VITALS — BP 114/76 | HR 47 | Ht 64.0 in | Wt 107.8 lb

## 2023-02-27 DIAGNOSIS — I4892 Unspecified atrial flutter: Secondary | ICD-10-CM | POA: Diagnosis not present

## 2023-02-27 DIAGNOSIS — I471 Supraventricular tachycardia, unspecified: Secondary | ICD-10-CM

## 2023-02-27 LAB — CBC WITH DIFFERENTIAL/PLATELET

## 2023-02-27 NOTE — Progress Notes (Signed)
Electrophysiology Office Note:    Date:  02/27/2023   ID:  Pamela Yang, DOB October 13, 1951, MRN [id number]  PCP:  Kathyrn Drown, MD   [facility] Providers Cardiologist:  Carlyle Dolly, MD Electrophysiologist:  Melida Quitter, MD     Referring MD: Kathyrn Drown, MD   History of Present Illness:    Pamela Yang is a 72 y.o. female with a hx listed below, significant for atrial fibrillation, referred for arrhythmia management.  She was diagnosed with AF in June, 2021 and managed with flecainide. I personally reviewed the ECGs from this episode (05/22/2022), and it appears to be atrial flutter to me. She was very symptomatic with palpitations during this episode.  She is recently seen by [place] and noted to be in a junctional rhythm.  Flecainide was discontinued and a monitor placed.  The monitor showed that she did continue to have rhythms that it interpreted as junctional, though all but one strip is uninterpretable due to artifact. These episodes were associated with a pounding sensation in her chest.  Since her last visit, she has been doing reasonably well overall but she did have a recurrence of atrial flutter in February resulting in tachycardia with 2-1 conduction.  Metoprolol was prescribed as needed.   Past Medical History:  Diagnosis Date   Atrial fibrillation (HCC)    Dysrhythmia    GAD (generalized anxiety disorder)    Gastroparesis 04/2007   Dr. Stann Mainland   GERD (gastroesophageal reflux disease)    Hearing loss    Osteoporosis     Past Surgical History:  Procedure Laterality Date   COLONOSCOPY  05/20/2011   Dr Laural Golden   COLONOSCOPY N/A 07/08/2021   Procedure: COLONOSCOPY;  Surgeon: Rogene Houston, MD;  Location[facility];  Service: Endoscopy;  Laterality: N/A;  10:55   TONSILLECTOMY      Current Medications: Current Meds  Medication Sig   Ascorbic Acid (VITAMIN C PO) Take 1,000 mg by mouth daily.   denosumab (PROLIA) 60  MG/ML SOSY injection Inject 60 mg into the skin every 6 (six) months.   Flaxseed, Linseed, (FLAXSEED OIL) 1200 MG CAPS Take 1,200 mg by mouth 2 (two) times daily.   metoprolol tartrate (LOPRESSOR) 25 MG tablet Take 1 tablet (25 mg total) by mouth 2 (two) times daily as needed (Palpitations and elevated heart rate).   Multiple Vitamins-Minerals (MULTIVITAMIN WITH MINERALS) tablet Take 1 tablet by mouth daily.   Propylene Glycol (SYSTANE BALANCE) 0.6 % SOLN Place 2 drops into both eyes in the morning and at bedtime. Into both eyes   Rivaroxaban (XARELTO) 15 MG TABS tablet TAKE ONE (1) TABLET BY MOUTH EVERY DAY   sertraline (ZOLOFT) 50 MG tablet 1 by mouth PO QD     Allergies:   Patient has no known allergies.   Social History   Socioeconomic History   Marital status: Widowed    Spouse name: Not on file   Number of children: 2   Years of education: Not on file   Highest education level: Not on file  Occupational History   Not on file  Tobacco Use   Smoking status: Former    Types: Cigarettes    Quit date: 12/20/1979    Years since quitting: 43.2   Smokeless tobacco: Never  Vaping Use   Vaping Use: Never used  Substance and Sexual Activity   Alcohol use: Yes    Alcohol/week: 7.0 standard drinks of alcohol    Types:  7 Glasses of wine per week    Comment: occ   Drug use: Never   Sexual activity: Yes    Birth control/protection: Post-menopausal  Other Topics Concern   Not on file  Social History Narrative   1 daughter living. 1 daughter died at age 75.   No grandchildren.   Husband passed 06/2021. Married x 24 years.   Social Determinants of Health   Financial Resource Strain: Low Risk  (12/30/2022)   Overall Financial Resource Strain (CARDIA)    Difficulty of Paying Living Expenses: Not hard at all  Food Insecurity: No Food Insecurity (12/30/2022)   Hunger Vital Sign    Worried About Running Out of Food in the Last Year: Never true    Ran Out of Food in the Last Year: Never  true  Transportation Needs: No Transportation Needs (12/30/2022)   PRAPARE - Hydrologist (Medical): No    Lack of Transportation (Non-Medical): No  Physical Activity: Sufficiently Active (12/30/2022)   Exercise Vital Sign    Days of Exercise per Week: 7 days    Minutes of Exercise per Session: 150+ min  Stress: No Stress Concern Present (12/30/2022)   [place]    Feeling of Stress : Only a little  Social Connections: Moderately Integrated (12/30/2022)   Social Connection and Isolation Panel [NHANES]    Frequency of Communication with Friends and Family: More than three times a week    Frequency of Social Gatherings with Friends and Family: Three times a week    Attends Religious Services: More than 4 times per year    Active Member of Clubs or Organizations: Yes    Attends Archivist Meetings: More than 4 times per year    Marital Status: Widowed     Family History: The patient's family history includes Coronary artery disease in her brother; Heart attack in her mother; Hypertension in her sister and sister; Osteoporosis in her sister; Ovarian cancer in her sister.  ROS:   Please see the history of present illness.    All other systems reviewed and are negative.  EKGs/Labs/Other Studies Reviewed Today:     Zio patch strips: independently reviewed by me:  Sinus rhythm, HR 39-112 bpm, avg 50 Symptoms of fluttering, skipped beats, lightheadedness were associated with accelerated junctional rhythm -- difficult to determine due to artifact and poor quality. There were episodes of SVT with long-RP, likely atrial tachycardia  EKG:  Last EKG results: today - sinus bradycardia, anteroseptal MI   Recent Labs: 11/07/2022: TSH 1.520 02/06/2023: ALT 23; BUN 21; Creatinine, Ser 0.81; Hemoglobin 13.1; Magnesium 2.1; Platelets 173; Potassium 3.7; Sodium 137     Physical Exam:    VS:   BP 114/76   Pulse (!) 47   Ht '5\' 4"'$  (1.626 m)   Wt 107 lb 12.8 oz (48.9 kg)   BMI 18.50 kg/m     Wt Readings from Last 3 Encounters:  02/27/23 107 lb 12.8 oz (48.9 kg)  02/06/23 105 lb (47.6 kg)  01/27/23 105 lb (47.6 kg)     GEN:  Well nourished, well developed in no acute distress CARDIAC: brady, regular, no murmurs, rubs, gallops RESPIRATORY:  Normal work of breathing MUSCULOSKELETAL: no edema    ASSESSMENT & PLAN:    Atrial flutter: no evidence of recurrence of sustained arrhythmia since 2001, per patient. Flecainide was recently discontinued. Presented to ER 02/06/2023 with recurrence. ECG showed flutter. She  is scheduled for ablation. We discussed the indication, rationale, logistics, anticipated benefits, and potential risks of the ablation procedure including but not limited to -- bleed at the groin access site, chest pain, damage to nearby organs such as the diaphragm, lungs, or esophagus, need for a drainage tube, or prolonged hospitalization. I explained that the risk for stroke, heart attack, need for open chest surgery, or even death is very low but not zero. she  expressed understanding and wishes to proceed.  Junctional rhythms: appears improved off of flecainide. She has mild symptoms. I do not see indication for pacemaker         Medication Adjustments/Labs and Tests Ordered: Current medicines are reviewed at length with the patient today.  Concerns regarding medicines are outlined above.  No orders of the defined types were placed in this encounter.  No orders of the defined types were placed in this encounter.    Signed, Melida Quitter, MD  02/27/2023 11:42 AM    [place]

## 2023-02-27 NOTE — H&P (View-Only) (Signed)
Electrophysiology Office Note:    Date:  02/27/2023   ID:  Pamela Yang, DOB 09/12/1951, MRN [id number]  PCP:  Luking, Scott A, MD   [facility] Providers Cardiologist:  Branch, Jonathan, MD Electrophysiologist:  Clarabel Marion E Joanell Cressler, MD     Referring MD: Luking, Scott A, MD   History of Present Illness:    Pamela Yang is a 71 y.o. female with a hx listed below, significant for atrial fibrillation, referred for arrhythmia management.  She was diagnosed with AF in June, 2021 and managed with flecainide. I personally reviewed the ECGs from this episode (05/22/2022), and it appears to be atrial flutter to me. She was very symptomatic with palpitations during this episode.  She is recently seen by Brittany Strader and noted to be in a junctional rhythm.  Flecainide was discontinued and a monitor placed.  The monitor showed that she did continue to have rhythms that it interpreted as junctional, though all but one strip is uninterpretable due to artifact. These episodes were associated with a pounding sensation in her chest.  Since her last visit, she has been doing reasonably well overall but she did have a recurrence of atrial flutter in February resulting in tachycardia with 2-1 conduction.  Metoprolol was prescribed as needed.   Past Medical History:  Diagnosis Date   Atrial fibrillation (HCC)    Dysrhythmia    GAD (generalized anxiety disorder)    Gastroparesis 04/2007   Dr. Manus   GERD (gastroesophageal reflux disease)    Hearing loss    Osteoporosis     Past Surgical History:  Procedure Laterality Date   COLONOSCOPY  05/20/2011   Dr Rehman   COLONOSCOPY N/A 07/08/2021   Procedure: COLONOSCOPY;  Surgeon: Rehman, Najeeb U, MD;  Location: [facility];  Service: Endoscopy;  Laterality: N/A;  10:55   TONSILLECTOMY      Current Medications: Current Meds  Medication Sig   Ascorbic Acid (VITAMIN C PO) Take 1,000 mg by mouth daily.   denosumab (PROLIA) 60  MG/ML SOSY injection Inject 60 mg into the skin every 6 (six) months.   Flaxseed, Linseed, (FLAXSEED OIL) 1200 MG CAPS Take 1,200 mg by mouth 2 (two) times daily.   metoprolol tartrate (LOPRESSOR) 25 MG tablet Take 1 tablet (25 mg total) by mouth 2 (two) times daily as needed (Palpitations and elevated heart rate).   Multiple Vitamins-Minerals (MULTIVITAMIN WITH MINERALS) tablet Take 1 tablet by mouth daily.   Propylene Glycol (SYSTANE BALANCE) 0.6 % SOLN Place 2 drops into both eyes in the morning and at bedtime. Into both eyes   Rivaroxaban (XARELTO) 15 MG TABS tablet TAKE ONE (1) TABLET BY MOUTH EVERY DAY   sertraline (ZOLOFT) 50 MG tablet 1 by mouth PO QD     Allergies:   Patient has no known allergies.   Social History   Socioeconomic History   Marital status: Widowed    Spouse name: Not on file   Number of children: 2   Years of education: Not on file   Highest education level: Not on file  Occupational History   Not on file  Tobacco Use   Smoking status: Former    Types: Cigarettes    Quit date: 12/20/1979    Years since quitting: 43.2   Smokeless tobacco: Never  Vaping Use   Vaping Use: Never used  Substance and Sexual Activity   Alcohol use: Yes    Alcohol/week: 7.0 standard drinks of alcohol    Types:   7 Glasses of wine per week    Comment: occ   Drug use: Never   Sexual activity: Yes    Birth control/protection: Post-menopausal  Other Topics Concern   Not on file  Social History Narrative   1 daughter living. 1 daughter died at age 8.   No grandchildren.   Husband passed 06/2021. Married x 24 years.   Social Determinants of Health   Financial Resource Strain: Low Risk  (12/30/2022)   Overall Financial Resource Strain (CARDIA)    Difficulty of Paying Living Expenses: Not hard at all  Food Insecurity: No Food Insecurity (12/30/2022)   Hunger Vital Sign    Worried About Running Out of Food in the Last Year: Never true    Ran Out of Food in the Last Year: Never  true  Transportation Needs: No Transportation Needs (12/30/2022)   PRAPARE - Transportation    Lack of Transportation (Medical): No    Lack of Transportation (Non-Medical): No  Physical Activity: Sufficiently Active (12/30/2022)   Exercise Vital Sign    Days of Exercise per Week: 7 days    Minutes of Exercise per Session: 150+ min  Stress: No Stress Concern Present (12/30/2022)   [employer] - Occupational Stress Questionnaire    Feeling of Stress : Only a little  Social Connections: Moderately Integrated (12/30/2022)   Social Connection and Isolation Panel [NHANES]    Frequency of Communication with Friends and Family: More than three times a week    Frequency of Social Gatherings with Friends and Family: Three times a week    Attends Religious Services: More than 4 times per year    Active Member of Clubs or Organizations: Yes    Attends Club or Organization Meetings: More than 4 times per year    Marital Status: Widowed     Family History: The patient's family history includes Coronary artery disease in her brother; Heart attack in her mother; Hypertension in her sister and sister; Osteoporosis in her sister; Ovarian cancer in her sister.  ROS:   Please see the history of present illness.    All other systems reviewed and are negative.  EKGs/Labs/Other Studies Reviewed Today:     Zio patch strips: independently reviewed by me:  Sinus rhythm, HR 39-112 bpm, avg 50 Symptoms of fluttering, skipped beats, lightheadedness were associated with accelerated junctional rhythm -- difficult to determine due to artifact and poor quality. There were episodes of SVT with long-RP, likely atrial tachycardia  EKG:  Last EKG results: today - sinus bradycardia, anteroseptal MI   Recent Labs: 11/07/2022: TSH 1.520 02/06/2023: ALT 23; BUN 21; Creatinine, Ser 0.81; Hemoglobin 13.1; Magnesium 2.1; Platelets 173; Potassium 3.7; Sodium 137     Physical Exam:    VS:   BP 114/76   Pulse (!) 47   Ht 5' 4" (1.626 m)   Wt 107 lb 12.8 oz (48.9 kg)   BMI 18.50 kg/m     Wt Readings from Last 3 Encounters:  02/27/23 107 lb 12.8 oz (48.9 kg)  02/06/23 105 lb (47.6 kg)  01/27/23 105 lb (47.6 kg)     GEN:  Well nourished, well developed in no acute distress CARDIAC: brady, regular, no murmurs, rubs, gallops RESPIRATORY:  Normal work of breathing MUSCULOSKELETAL: no edema    ASSESSMENT & PLAN:    Atrial flutter: no evidence of recurrence of sustained arrhythmia since 2001, per patient. Flecainide was recently discontinued. Presented to ER 02/06/2023 with recurrence. ECG showed flutter. She   is scheduled for ablation. We discussed the indication, rationale, logistics, anticipated benefits, and potential risks of the ablation procedure including but not limited to -- bleed at the groin access site, chest pain, damage to nearby organs such as the diaphragm, lungs, or esophagus, need for a drainage tube, or prolonged hospitalization. I explained that the risk for stroke, heart attack, need for open chest surgery, or even death is very low but not zero. she  expressed understanding and wishes to proceed.  Junctional rhythms: appears improved off of flecainide. She has mild symptoms. I do not see indication for pacemaker         Medication Adjustments/Labs and Tests Ordered: Current medicines are reviewed at length with the patient today.  Concerns regarding medicines are outlined above.  No orders of the defined types were placed in this encounter.  No orders of the defined types were placed in this encounter.    Signed, Iesha Summerhill E Shantay Sonn, MD  02/27/2023 11:42 AM    [facility] 

## 2023-02-27 NOTE — Patient Instructions (Addendum)
Medication Instructions:  Your physician recommends that you continue on your current medications as directed. Please refer to the Current Medication list given to you today.  *If you need a refill on your cardiac medications before your next appointment, please call your pharmacy*  Lab Work: CBC and BMET today. 02/27/2023  Testing/Procedures: None ordered.  Follow-Up: Your Atrial Flutter Ablation is scheduled with Dr. Doralee Albino on 03/15/2023.  You will have lab work drawn today, CBC and BMET.     Your next appointment:   1 year(s)  The format for your next appointment:   In Person  Provider:   Doralee Albino, MD{or one of the following Advanced Practice Providers on your designated Care Team:   Tommye Standard, [place] Legrand Como "Jonni Sanger" Chalmers Cater, [place]  Cardiac Ablation Cardiac ablation is a procedure to destroy, or ablate, a small amount of heart tissue that is causing problems. The heart has many electrical connections. Sometimes, these connections are abnormal and can cause the heart to beat very fast or irregularly. Ablating the abnormal areas can improve the heart's rhythm or return it to normal. Ablation may be done for people who: Have irregular or rapid heartbeats (arrhythmias). Have Wolff-Parkinson-White syndrome. Have taken medicines for an arrhythmia that did not work or caused side effects. Have a high-risk heartbeat that may be life-threatening. Tell a health care provider about: Any allergies you have. All medicines you are taking, including vitamins, herbs, eye drops, creams, and over-the-counter medicines. Any problems you or family members have had with anesthesia. Any bleeding problems you have. Any surgeries you have had. Any medical conditions you have. Whether you are pregnant or may be pregnant. What are the risks? Your health care provider will talk with you about risks. These may include: Infection. Bruising and bleeding. Stroke or blood clots. Damage to  nearby structures or organs. Allergic reaction to medicines or dyes. Needing a pacemaker if the heart gets damaged. A pacemaker is a device that helps the heart beat normally. Failure of the procedure. A repeat procedure may be needed. What happens before the procedure? Medicines Ask your health care provider about: Changing or stopping your regular medicines. These include any heart rhythm medicines, diabetes medicines, or blood thinners you take. Taking medicines such as aspirin and ibuprofen. These medicines can thin your blood. Do not take them unless your health care provider tells you to. Taking over-the-counter medicines, vitamins, herbs, and supplements. General instructions Follow instructions from your health care provider about what you may eat and drink. If you will be going home right after the procedure, plan to have a responsible adult: Take you home from the hospital or clinic. You will not be allowed to drive. Care for you for the time you are told. Ask your health care provider what steps will be taken to prevent infection. What happens during the procedure?  An IV will be inserted into one of your veins. You may be given: A sedative. This helps you relax. Anesthesia. This will: Numb certain areas of your body. An incision will be made in your neck or your groin. A needle will be inserted through the incision and into a large vein in your neck or groin. The small, thin tube (catheter) will be inserted through the needle and moved to your heart. A type of X-ray (fluoroscopy) will be used to help guide the catheter and provide images of the heart on a monitor. Dye may be injected through the catheter to help your surgeon see the area of  the heart that needs treatment. Electrical currents will be sent from the catheter to destroy heart tissue in certain areas. There are three types of energy that may be used to do this: Heat (radiofrequency energy). Laser energy. Extreme  cold (cryoablation). When the tissue has been destroyed, the catheter will be removed. Pressure will be held on the insertion area to prevent bleeding. A bandage (dressing) will be placed over the insertion area. The procedure may vary among health care providers and hospitals. What happens after the procedure? Your blood pressure, heart rate and rhythm, breathing rate, and blood oxygen level will be monitored until you leave the hospital or clinic. Your insertion area will be checked for bleeding. You will need to lie still for a few hours. If your groin was used, you will need to keep your leg straight for a few hours after the catheter is removed. This information is not intended to replace advice given to you by your health care provider. Make sure you discuss any questions you have with your health care provider. Document Revised: 05/24/2022 Document Reviewed: 05/24/2022 Elsevier Patient Education  [place].

## 2023-02-28 ENCOUNTER — Telehealth: Payer: Self-pay | Admitting: Cardiovascular Disease

## 2023-02-28 LAB — CBC WITH DIFFERENTIAL/PLATELET
Basophils Absolute: 0 10*3/uL (ref 0.0–0.2)
Basos: 1 %
EOS (ABSOLUTE): 0.1 10*3/uL (ref 0.0–0.4)
Eos: 2 %
Hematocrit: 35.7 % (ref 34.0–46.6)
Hemoglobin: 11.7 g/dL (ref 11.1–15.9)
Immature Grans (Abs): 0 10*3/uL (ref 0.0–0.1)
Immature Granulocytes: 0 %
Lymphocytes Absolute: 1.4 10*3/uL (ref 0.7–3.1)
Lymphs: 30 %
MCH: 27.5 pg (ref 26.6–33.0)
MCHC: 32.8 g/dL (ref 31.5–35.7)
MCV: 84 fL (ref 79–97)
Monocytes Absolute: 0.4 10*3/uL (ref 0.1–0.9)
Monocytes: 9 %
Neutrophils Absolute: 2.7 10*3/uL (ref 1.4–7.0)
Neutrophils: 58 %
Platelets: 160 10*3/uL (ref 150–450)
RBC: 4.25 x10E6/uL (ref 3.77–5.28)
RDW: 13.5 % (ref 11.7–15.4)
WBC: 4.6 10*3/uL (ref 3.4–10.8)

## 2023-02-28 LAB — BASIC METABOLIC PANEL
BUN/Creatinine Ratio: 22 (ref 12–28)
BUN: 18 mg/dL (ref 8–27)
CO2: 25 mmol/L (ref 20–29)
Calcium: 9.9 mg/dL (ref 8.7–10.3)
Chloride: 104 mmol/L (ref 96–106)
Creatinine, Ser: 0.81 mg/dL (ref 0.57–1.00)
Glucose: 92 mg/dL (ref 70–99)
Potassium: 4.6 mmol/L (ref 3.5–5.2)
Sodium: 141 mmol/L (ref 134–144)
eGFR: 77 mL/min/{1.73_m2} (ref >59)

## 2023-02-28 NOTE — Telephone Encounter (Signed)
Returned call to patient for reported palpitations. She states her heart rate was 130, she took one dose of metoprolol 25 mg and states her heart rate has returned to rate of 50s.  She states that she was told to notify us if she had any episodes.  She denies chest pain and shortness of breath. She does report feeling fatigued following this episode.   Advised to notify us if she experiences more episodes and if she has additional symptoms such as chest pain, SOB and/or N/V to go to the ED for evaluation.  Patient verbalized understanding and had no questions.

## 2023-02-28 NOTE — Telephone Encounter (Signed)
STAT if HR is under 50 or over 120 (normal HR is 60-100 beats per minute)  What is your heart rate? 130; down to 72 to 75 after taking medication; hr usually be around 47 or 48  Do you have a log of your heart rate readings (document readings)? yes  Do you have any other symptoms? palpitations

## 2023-03-02 ENCOUNTER — Telehealth: Payer: Self-pay | Admitting: Cardiovascular Disease

## 2023-03-02 NOTE — Telephone Encounter (Signed)
Patient called and wanted to know if she should start taking her prn metoprolol everyday since she had palpitations two days ago.  I informed patient to only take as prescribed as needed when she is having palpitations and elevated heart rate. She has not experienced palpitations or had an elevated heart since March 12th. She denies any current symptoms. No complaints of shortness of breath, chest pain or discomfort, or dizziness.  She verbalized understanding.

## 2023-03-02 NOTE — Telephone Encounter (Signed)
Pt c/o medication issue:  1. Name of Medication:   metoprolol tartrate (LOPRESSOR) 25 MG tablet    2. How are you currently taking this medication (dosage and times per day)? Take 1 tablet (25 mg total) by mouth 2 (two) times daily as needed (Palpitations and elevated heart rate).   3. Are you having a reaction (difficulty breathing--STAT)? no  4. What is your medication issue? Pt would like a callback regarding whether or not she should continue to take medication twice a day or if she should only take as needed. Please advise.

## 2023-03-03 DIAGNOSIS — Z961 Presence of intraocular lens: Secondary | ICD-10-CM | POA: Diagnosis not present

## 2023-03-03 DIAGNOSIS — H43813 Vitreous degeneration, bilateral: Secondary | ICD-10-CM | POA: Diagnosis not present

## 2023-03-03 DIAGNOSIS — H04123 Dry eye syndrome of bilateral lacrimal glands: Secondary | ICD-10-CM | POA: Diagnosis not present

## 2023-03-13 ENCOUNTER — Telehealth: Payer: Self-pay | Admitting: Cardiovascular Disease

## 2023-03-13 NOTE — Telephone Encounter (Signed)
Returned call to patient. She states she has issues with severe dry mouth and was concerned about not being able to have anything to eat or drink after midnight with procedure scheduled for 1:30PM.  Patient asked if she could have some ice chips to wet her mouth prior to her procedure. Advised patient we do not want her to have a lot of liquid on her stomach prior to her procedure and to use just enough ice to wet her mouth, very small amounts. Patient verbalized understanding and expressed appreciation for follow-up.

## 2023-03-13 NOTE — Telephone Encounter (Signed)
Follow Up:     Patient said she received a letter from April. She says she does have some questions please.

## 2023-03-14 NOTE — Pre-Procedure Instructions (Signed)
Instructed patient on the following items: ?Arrival time 1130 ?Nothing to eat or drink after midnight ?No meds AM of procedure ?Responsible person to drive you home and stay with you for 24 hrs ? ?Have you missed any doses of anti-coagulant Xarelto- hasn't missed any doses ?   ?

## 2023-03-15 ENCOUNTER — Encounter (HOSPITAL_COMMUNITY): Payer: Self-pay | Admitting: Cardiovascular Disease

## 2023-03-15 ENCOUNTER — Ambulatory Visit (HOSPITAL_COMMUNITY)
Admission: RE | Admit: 2023-03-15 | Discharge: 2023-03-15 | Disposition: A | Discharge status: Home / Self Care | Payer: Medicare Other | Source: Ambulatory Visit | Attending: Cardiovascular Disease | Admitting: Cardiovascular Disease

## 2023-03-15 ENCOUNTER — Ambulatory Visit (HOSPITAL_COMMUNITY)
Admission: RE | Disposition: A | Discharge status: Home / Self Care | Payer: Self-pay | Source: Ambulatory Visit | Attending: Cardiovascular Disease

## 2023-03-15 ENCOUNTER — Ambulatory Visit (HOSPITAL_BASED_OUTPATIENT_CLINIC_OR_DEPARTMENT_OTHER): Payer: Medicare Other | Admitting: Certified Registered Nurse Anesthetist

## 2023-03-15 ENCOUNTER — Ambulatory Visit (HOSPITAL_COMMUNITY): Payer: Medicare Other | Admitting: Certified Registered Nurse Anesthetist

## 2023-03-15 DIAGNOSIS — Z87891 Personal history of nicotine dependence: Secondary | ICD-10-CM | POA: Insufficient documentation

## 2023-03-15 DIAGNOSIS — K219 Gastro-esophageal reflux disease without esophagitis: Secondary | ICD-10-CM | POA: Insufficient documentation

## 2023-03-15 DIAGNOSIS — I4892 Unspecified atrial flutter: Secondary | ICD-10-CM | POA: Insufficient documentation

## 2023-03-15 DIAGNOSIS — I4891 Unspecified atrial fibrillation: Secondary | ICD-10-CM | POA: Insufficient documentation

## 2023-03-15 DIAGNOSIS — Z7901 Long term (current) use of anticoagulants: Secondary | ICD-10-CM | POA: Insufficient documentation

## 2023-03-15 DIAGNOSIS — Z8249 Family history of ischemic heart disease and other diseases of the circulatory system: Secondary | ICD-10-CM | POA: Insufficient documentation

## 2023-03-15 HISTORY — PX: A-FLUTTER ABLATION: EP1230

## 2023-03-15 SURGERY — A-FLUTTER ABLATION
Anesthesia: General

## 2023-03-15 MED ORDER — SODIUM CHLORIDE 0.9% FLUSH: 3.0000 mL | INTRAVENOUS | Status: DC | PRN

## 2023-03-15 MED ORDER — ONDANSETRON HCL 4 MG/2ML IJ SOLN: 4.0000 mg | Freq: Four times a day (QID) | INTRAMUSCULAR | Status: DC | PRN

## 2023-03-15 MED ORDER — SODIUM CHLORIDE 0.9% FLUSH: 3.0000 mL | Freq: Two times a day (BID) | INTRAVENOUS | Status: DC

## 2023-03-15 MED ORDER — FENTANYL CITRATE (PF) 100 MCG/2ML IJ SOLN
INTRAMUSCULAR | Status: AC
  Filled 2023-03-15: qty 2

## 2023-03-15 MED ORDER — SODIUM CHLORIDE 0.9 % IV SOLN: INTRAVENOUS | Status: DC

## 2023-03-15 MED ORDER — EPHEDRINE SULFATE-NACL 50-0.9 MG/10ML-% IV SOSY
PREFILLED_SYRINGE | INTRAVENOUS | Status: DC | PRN
  Administered 2023-03-15 (×3): 5 mg via INTRAVENOUS

## 2023-03-15 MED ORDER — PHENYLEPHRINE HCL-NACL 20-0.9 MG/250ML-% IV SOLN
INTRAVENOUS | Status: DC | PRN
  Administered 2023-03-15: 25 ug/min via INTRAVENOUS

## 2023-03-15 MED ORDER — ROCURONIUM BROMIDE 10 MG/ML (PF) SYRINGE
PREFILLED_SYRINGE | INTRAVENOUS | Status: DC | PRN
  Administered 2023-03-15: 40 mg via INTRAVENOUS

## 2023-03-15 MED ORDER — SODIUM CHLORIDE 0.9 % IV SOLN: 250.0000 mL | INTRAVENOUS | Status: DC | PRN

## 2023-03-15 MED ORDER — ACETAMINOPHEN 325 MG PO TABS
650.0000 mg | ORAL_TABLET | ORAL | Status: DC | PRN
  Administered 2023-03-15: 650 mg via ORAL
  Filled 2023-03-15: qty 2

## 2023-03-15 MED ORDER — SUGAMMADEX SODIUM 200 MG/2ML IV SOLN
INTRAVENOUS | Status: DC | PRN
  Administered 2023-03-15: 150 mg via INTRAVENOUS

## 2023-03-15 MED ORDER — HEPARIN SODIUM (PORCINE) 1000 UNIT/ML IJ SOLN
INTRAMUSCULAR | Status: AC
  Filled 2023-03-15: qty 10

## 2023-03-15 MED ORDER — DEXAMETHASONE SODIUM PHOSPHATE 10 MG/ML IJ SOLN
INTRAMUSCULAR | Status: DC | PRN
  Administered 2023-03-15: 10 mg via INTRAVENOUS

## 2023-03-15 MED ORDER — FENTANYL CITRATE (PF) 250 MCG/5ML IJ SOLN
INTRAMUSCULAR | Status: DC | PRN
  Administered 2023-03-15: 100 ug via INTRAVENOUS

## 2023-03-15 MED ORDER — PROPOFOL 10 MG/ML IV BOLUS
INTRAVENOUS | Status: DC | PRN
  Administered 2023-03-15: 100 mg via INTRAVENOUS
  Administered 2023-03-15: 40 mg via INTRAVENOUS

## 2023-03-15 MED ORDER — SUCCINYLCHOLINE CHLORIDE 200 MG/10ML IV SOSY
PREFILLED_SYRINGE | INTRAVENOUS | Status: DC | PRN
  Administered 2023-03-15: 100 mg via INTRAVENOUS

## 2023-03-15 MED ORDER — ONDANSETRON HCL 4 MG/2ML IJ SOLN
INTRAMUSCULAR | Status: DC | PRN
  Administered 2023-03-15: 4 mg via INTRAVENOUS

## 2023-03-15 MED ORDER — PHENYLEPHRINE 80 MCG/ML (10ML) SYRINGE FOR IV PUSH (FOR BLOOD PRESSURE SUPPORT)
PREFILLED_SYRINGE | INTRAVENOUS | Status: DC | PRN
  Administered 2023-03-15: 80 ug via INTRAVENOUS

## 2023-03-15 MED ORDER — HEPARIN (PORCINE) IN NACL 1000-0.9 UT/500ML-% IV SOLN
INTRAVENOUS | Status: DC | PRN
  Administered 2023-03-15 (×2): 500 mL

## 2023-03-15 MED ORDER — LIDOCAINE 2% (20 MG/ML) 5 ML SYRINGE
INTRAMUSCULAR | Status: DC | PRN
  Administered 2023-03-15: 100 mg via INTRAVENOUS

## 2023-03-15 SURGICAL SUPPLY — 12 items
CATH SMTCH THERMOCOOL SF FJ (CATHETERS) IMPLANT
CATH SOUNDSTAR ECO 8FR (CATHETERS) IMPLANT
CATH WEB BI DIR CSDF CRV REPRO (CATHETERS) IMPLANT
DEVICE CLOSURE MYNXGRIP 6/7F (Vascular Products) IMPLANT
PACK EP LATEX FREE (CUSTOM PROCEDURE TRAY) ×1
PACK EP LF (CUSTOM PROCEDURE TRAY) ×1 IMPLANT
PAD DEFIB RADIO PHYSIO CONN (PAD) ×1 IMPLANT
PATCH CARTO3 (PAD) IMPLANT
SHEATH 9FR PRELUDE SNAP 13 (SHEATH) IMPLANT
SHEATH PINNACLE 8F 10CM (SHEATH) IMPLANT
SHEATH PROBE COVER 6X72 (BAG) IMPLANT
TUBING SMART ABLATE COOLFLOW (TUBING) IMPLANT

## 2023-03-15 NOTE — Anesthesia Preprocedure Evaluation (Addendum)
Anesthesia Evaluation    Reviewed: Allergy & Precautions, Patient's Chart, lab work & pertinent test results  History of Anesthesia Complications Negative for: history of anesthetic complications  Airway Mallampati: III  TM Distance: >3 FB Neck ROM: Full    Dental no notable dental hx. (+) Dental Advisory Given   Pulmonary former smoker   Pulmonary exam normal        Cardiovascular + dysrhythmias Atrial Fibrillation  Rhythm:Irregular Rate:Normal     Neuro/Psych  PSYCHIATRIC DISORDERS Anxiety Depression    negative neurological ROS     GI/Hepatic Neg liver ROS,GERD  ,,  Endo/Other  negative endocrine ROS    Renal/GU negative Renal ROS     Musculoskeletal negative musculoskeletal ROS (+)    Abdominal   Peds  Hematology negative hematology ROS (+)   Anesthesia Other Findings   Reproductive/Obstetrics                             Anesthesia Physical Anesthesia Plan  ASA: 2  Anesthesia Plan: General   Post-op Pain Management: Tylenol PO (pre-op)*   Induction: Intravenous  PONV Risk Score and Plan: 3 and Ondansetron and Dexamethasone  Airway Management Planned: Oral ETT  Additional Equipment: None  Intra-op Plan:   Post-operative Plan: Extubation in OR  Informed Consent: I have reviewed the patients History and Physical, chart, labs and discussed the procedure including the risks, benefits and alternatives for the proposed anesthesia with the patient or authorized representative who has indicated his/her understanding and acceptance.     Dental advisory given  Plan Discussed with: CRNA and Anesthesiologist  Anesthesia Plan Comments:        Anesthesia Quick Evaluation

## 2023-03-15 NOTE — Anesthesia Postprocedure Evaluation (Signed)
Anesthesia Post Note  Patient: Pamela Yang  Procedure(s) Performed: A-FLUTTER ABLATION     Patient location during evaluation: PACU Anesthesia Type: General Level of consciousness: sedated Pain management: pain level controlled Vital Signs Assessment: post-procedure vital signs reviewed and stable Respiratory status: spontaneous breathing and respiratory function stable Cardiovascular status: stable Postop Assessment: no apparent nausea or vomiting Anesthetic complications: yes  Encounter Notable Events  Notable Event Outcome Phase Comment  Difficult to intubate - expected  Intraprocedure Filed from anesthesia note documentation.    Last Vitals:  Vitals:   03/15/23 1350 03/15/23 1352  BP: (!) 143/71   Pulse: (!) 56   Resp: 11   Temp:  36.5 C  SpO2: 98%     Last Pain:  Vitals:   03/15/23 1352  TempSrc: Temporal  PainSc: 0-No pain                 Rawn Quiroa DANIEL

## 2023-03-15 NOTE — Discharge Instructions (Signed)

## 2023-03-15 NOTE — Anesthesia Procedure Notes (Signed)
Procedure Name: Intubation Date/Time: 03/15/2023 11:32 AM  Performed by: Inda Coke, CRNAPre-anesthesia Checklist: Patient identified, Emergency Drugs available, Suction available, Timeout performed and Patient being monitored Patient Re-evaluated:Patient Re-evaluated prior to induction Oxygen Delivery Method: Circle system utilized Preoxygenation: Pre-oxygenation with 100% oxygen Induction Type: IV induction Ventilation: Mask ventilation without difficulty Laryngoscope Size: Glidescope and 3 Grade View: Grade I Tube type: Oral Tube size: 7.0 mm Number of attempts: 4 Airway Equipment and Method: Rigid stylet Placement Confirmation: ETT inserted through vocal cords under direct vision, positive ETCO2, CO2 detector and breath sounds checked- equal and bilateral Secured at: 21 cm Tube secured with: Tape Dental Injury: Teeth and Oropharynx as per pre-operative assessment and Bloody posterior oropharynx  Difficulty Due To: Difficulty was anticipated and Difficult Airway- due to anterior larynx Comments: DLx1 with MAC 3 blade and grade III view & esophageal intubation, DL x 2 with Sabra Heck 2 and grade III view & esophageal intubation, DL x 3 by Dr. Tobias Alexander with Sabra Heck 2 blade and esophageal intubation. DL x 4 with glidescope 3 blade and grade I view and successful tracheal intubation.

## 2023-03-15 NOTE — Interval H&P Note (Signed)
History and Physical Interval Note:  03/15/2023 10:57 AM  Pamela Yang  has presented today for surgery, with the diagnosis of aflutter.  The various methods of treatment have been discussed with the patient and family. After consideration of risks, benefits and other options for treatment, the patient has consented to  Procedure(s): A-FLUTTER ABLATION (N/A) as a surgical intervention.  The patient's history has been reviewed, patient examined, no change in status, stable for surgery.  I have reviewed the patient's chart and labs.  Questions were answered to the patient's satisfaction.     Yetta Barre Zachari Alberta

## 2023-03-15 NOTE — Transfer of Care (Signed)
Immediate Anesthesia Transfer of Care Note  Patient: Pamela Yang  Procedure(s) Performed: A-FLUTTER ABLATION  Patient Location: cath lab recovery  Anesthesia Type:General  Level of Consciousness: awake, alert , and oriented  Airway & Oxygen Therapy: Patient Spontanous Breathing and Patient connected to nasal cannula oxygen  Post-op Assessment: Report given to RN and Post -op Vital signs reviewed and stable  Post vital signs: Reviewed and stable  Last Vitals:  Vitals Value Taken Time  BP 128/78 03/15/23 1318  Temp 36.8 C 03/15/23 1321  Pulse 62 03/15/23 1323  Resp 16 03/15/23 1323  SpO2 100 % 03/15/23 1323  Vitals shown include unvalidated device data.  Last Pain:  Vitals:   03/15/23 1321  TempSrc: Temporal  PainSc: Asleep         Complications:  Encounter Notable Events  Notable Event Outcome Phase Comment  Difficult to intubate - expected  Intraprocedure Filed from anesthesia note documentation.

## 2023-03-16 ENCOUNTER — Encounter (HOSPITAL_COMMUNITY): Payer: Self-pay | Admitting: Cardiovascular Disease

## 2023-03-16 MED FILL — Heparin Sodium (Porcine) Inj 1000 Unit/ML: INTRAMUSCULAR | Qty: 10 | Status: AC

## 2023-04-19 DEATH — deceased

## 2023-04-28 ENCOUNTER — Encounter: Payer: Self-pay | Admitting: Cardiovascular Disease

## 2023-04-28 ENCOUNTER — Ambulatory Visit: Payer: Medicare Other | Attending: Cardiovascular Disease | Admitting: Cardiovascular Disease

## 2023-04-28 VITALS — BP 110/60 | HR 46 | Ht 64.0 in | Wt 105.0 lb

## 2023-04-28 DIAGNOSIS — I483 Typical atrial flutter: Secondary | ICD-10-CM | POA: Diagnosis not present

## 2023-04-28 DIAGNOSIS — D6869 Other thrombophilia: Secondary | ICD-10-CM | POA: Diagnosis not present

## 2023-04-28 NOTE — Patient Instructions (Signed)
Medication Instructions:  Your physician recommends that you continue on your current medications as directed. Please refer to the Current Medication list given to you today. *If you need a refill on your cardiac medications before your next appointment, please call your pharmacy*   Follow-Up: At [facility], you and your health needs are our priority.  As part of our continuing mission to provide you with exceptional heart care, we have created designated Provider Care Teams.  These Care Teams include your primary Cardiologist (physician) and Advanced Practice Providers (APPs -  Physician Assistants and Nurse Practitioners) who all work together to provide you with the care you need, when you need it.  We recommend signing up for the patient portal called "MyChart".  Sign up information is provided on this After Visit Summary.  MyChart is used to connect with patients for Virtual Visits (Telemedicine).  Patients are able to view lab/test results, encounter notes, upcoming appointments, etc.  Non-urgent messages can be sent to your provider as well.   To learn more about what you can do with MyChart, go to [email or web address].    Your next appointment:   6 month(s)  Provider:   You will follow up in the Atrial Fibrillation Clinic located at [facility]. Your provider will be: Donna Carroll, NP or Clint R. Fenton, PA-C  

## 2023-04-28 NOTE — Progress Notes (Signed)
Electrophysiology Office Note:    Date:  04/28/2023   ID:  Pamela Yang, DOB October 30, 1951, MRN [id number]  PCP:  Babs Sciara, MD   [facility] Providers Cardiologist:  Dina Rich, MD Electrophysiologist:  Maurice Small, MD     Referring MD: Babs Sciara, MD   History of Present Illness:    Pamela Yang is a 72 y.o. female with a hx listed below, significant for atrial fibrillation, referred for arrhythmia management.  She was diagnosed with AF in June, 2021 and managed with flecainide. I personally reviewed the ECGs from this episode (05/22/2022), and it appears to be atrial flutter to me. She was very symptomatic with palpitations during this episode.  She is recently seen by [place] and noted to be in a junctional rhythm.  Flecainide was discontinued and a monitor placed.  The monitor showed that she did continue to have rhythms that it interpreted as junctional, though all but one strip is uninterpretable due to artifact. These episodes were associated with a pounding sensation in her chest.  she did have a recurrence of atrial flutter in February resulting in tachycardia with 2-1 conduction.  Metoprolol was prescribed as needed.  She underwent ablation of typical atrial flutter in March 2024.  She presented in sinus rhythm but did convert to a typical atrial flutter during the case and it was successfully ablated  Past Medical History:  Diagnosis Date   Atrial fibrillation (HCC)    Dysrhythmia    GAD (generalized anxiety disorder)    Gastroparesis 04/2007   Dr. Cira Servant   GERD (gastroesophageal reflux disease)    Hearing loss    Osteoporosis     Past Surgical History:  Procedure Laterality Date   A-FLUTTER ABLATION N/A 03/15/2023   Procedure: A-FLUTTER ABLATION;  Surgeon: Elliott Quade, Roberts Gaudy, MD;  [facility];  Service: Cardiovascular;  Laterality: N/A;   COLONOSCOPY  05/20/2011   Dr Karilyn Cota   COLONOSCOPY N/A 07/08/2021    Procedure: COLONOSCOPY;  Surgeon: Malissa Hippo, MD;  [facility]: [facility];  Service: Endoscopy;  Laterality: N/A;  10:55   TONSILLECTOMY      Current Medications: Current Meds  Medication Sig   Ascorbic Acid (VITAMIN C PO) Take 1,000 mg by mouth daily.   denosumab (PROLIA) 60 MG/ML SOSY injection Inject 60 mg into the skin every 6 (six) months.   Flaxseed, Linseed, (FLAXSEED OIL) 1200 MG CAPS Take 1,200 mg by mouth 2 (two) times daily.   metoprolol tartrate (LOPRESSOR) 25 MG tablet Take 1 tablet (25 mg total) by mouth 2 (two) times daily as needed (Palpitations and elevated heart rate).   Multiple Vitamins-Minerals (MULTIVITAMIN WITH MINERALS) tablet Take 1 tablet by mouth daily.   Propylene Glycol (SYSTANE BALANCE) 0.6 % SOLN Place 2 drops into both eyes in the morning and at bedtime. Into both eyes   Rivaroxaban (XARELTO) 15 MG TABS tablet TAKE ONE (1) TABLET BY MOUTH EVERY DAY   sertraline (ZOLOFT) 50 MG tablet 1 by mouth PO QD     Allergies:   Patient has no known allergies.   Social History   Socioeconomic History   Marital status: Widowed    Spouse name: Not on file   Number of children: 2   Years of education: Not on file   Highest education level: Not on file  Occupational History   Not on file  Tobacco Use   Smoking status: Former    Types: Cigarettes  Quit date: 12/20/1979    Years since quitting: 43.3   Smokeless tobacco: Never  Vaping Use   Vaping Use: Never used  Substance and Sexual Activity   Alcohol use: Yes    Alcohol/week: 7.0 standard drinks of alcohol    Types: 7 Glasses of wine per week    Comment: occ   Drug use: Never   Sexual activity: Yes    Birth control/protection: Post-menopausal  Other Topics Concern   Not on file  Social History Narrative   1 daughter living. 1 daughter died at age 37.   No grandchildren.   Husband passed 06/2021. Married x 24 years.   Social Determinants of Health   Financial Resource Strain: Low Risk   (12/30/2022)   Overall Financial Resource Strain (CARDIA)    Difficulty of Paying Living Expenses: Not hard at all  Food Insecurity: No Food Insecurity (12/30/2022)   Hunger Vital Sign    Worried About Running Out of Food in the Last Year: Never true    Ran Out of Food in the Last Year: Never true  Transportation Needs: No Transportation Needs (12/30/2022)   PRAPARE - Administrator, Civil Service (Medical): No    Lack of Transportation (Non-Medical): No  Physical Activity: Sufficiently Active (12/30/2022)   Exercise Vital Sign    Days of Exercise per Week: 7 days    Minutes of Exercise per Session: 150+ min  Stress: No Stress Concern Present (12/30/2022)   [employer] of Occupational Health - Occupational Stress Questionnaire    Feeling of Stress : Only a little  Social Connections: Moderately Integrated (12/30/2022)   Social Connection and Isolation Panel [NHANES]    Frequency of Communication with Friends and Family: More than three times a week    Frequency of Social Gatherings with Friends and Family: Three times a week    Attends Religious Services: More than 4 times per year    Active Member of Clubs or Organizations: Yes    Attends Banker Meetings: More than 4 times per year    Marital Status: Widowed     Family History: The patient's family history includes Coronary artery disease in her brother; Heart attack in her mother; Hypertension in her sister and sister; Osteoporosis in her sister; Ovarian cancer in her sister.  ROS:   Please see the history of present illness.    All other systems reviewed and are negative.  EKGs/Labs/Other Studies Reviewed Today:     Zio patch strips: independently reviewed by me:  Sinus rhythm, HR 39-112 bpm, avg 50 Symptoms of fluttering, skipped beats, lightheadedness were associated with accelerated junctional rhythm -- difficult to determine due to artifact and poor quality. There were episodes of SVT with  long-RP, likely atrial tachycardia  EKG:  Last EKG results: today - sinus bradycardia, anteroseptal MI   Recent Labs: 11/07/2022: TSH 1.520 02/06/2023: ALT 23; Magnesium 2.1 02/27/2023: BUN 18; Creatinine, Ser 0.81; Hemoglobin 11.7; Platelets 160; Potassium 4.6; Sodium 141     Physical Exam:    VS:  BP 110/60   Pulse (!) 46   Ht 5\' 4"  (1.626 m)   Wt 105 lb (47.6 kg)   BMI 18.02 kg/m     Wt Readings from Last 3 Encounters:  04/28/23 105 lb (47.6 kg)  03/15/23 105 lb (47.6 kg)  02/27/23 107 lb 12.8 oz (48.9 kg)     GEN:  Well nourished, well developed in no acute distress CARDIAC: brady, regular, no murmurs, rubs,  gallops RESPIRATORY:  Normal work of breathing MUSCULOSKELETAL: no edema    ASSESSMENT & PLAN:    Atrial flutter:  She had recurrence of atrial flutter after flecainide was discontinued Status post ablation March 15, 2023 She maintained sinus rhythm after ablation  Secondary hypercoagulable state Will continue to monitor for atrial fibrillation and recurrence of flutter over the next 6 months. If she returns in follow-up without have any evidence of recurrence, I think the risk/benefit favors discontinuation of Xarelto  Junctional rhythms:  appears resolved off of flecainide.  She has a history of sinus bradycardia; was a former distance runner         Medication Adjustments/Labs and Tests Ordered: Current medicines are reviewed at length with the patient today.  Concerns regarding medicines are outlined above.  No orders of the defined types were placed in this encounter.  No orders of the defined types were placed in this encounter.    Signed, Maurice Small, MD  04/28/2023 12:18 PM    [facility]

## 2023-05-04 DIAGNOSIS — K080 Exfoliation of teeth due to systemic causes: Secondary | ICD-10-CM | POA: Diagnosis not present

## 2023-05-11 DIAGNOSIS — K080 Exfoliation of teeth due to systemic causes: Secondary | ICD-10-CM | POA: Diagnosis not present

## 2023-05-22 ENCOUNTER — Other Ambulatory Visit: Payer: Self-pay | Admitting: Cardiology

## 2023-05-22 DIAGNOSIS — I480 Paroxysmal atrial fibrillation: Secondary | ICD-10-CM

## 2023-05-22 DIAGNOSIS — I483 Typical atrial flutter: Secondary | ICD-10-CM

## 2023-05-22 NOTE — Telephone Encounter (Signed)
Prescription refill request for Xarelto received.  Indication: a flutter Last office visit: 04/28/23 Weight:105 Age:72 Scr:0.81 02/27/23 epic CrCl:  54ml/min

## 2023-05-31 DIAGNOSIS — K080 Exfoliation of teeth due to systemic causes: Secondary | ICD-10-CM | POA: Diagnosis not present

## 2023-06-02 ENCOUNTER — Ambulatory Visit: Payer: Medicare Other | Admitting: Cardiovascular Disease

## 2023-06-09 DIAGNOSIS — K080 Exfoliation of teeth due to systemic causes: Secondary | ICD-10-CM | POA: Diagnosis not present

## 2023-07-11 ENCOUNTER — Other Ambulatory Visit: Payer: Self-pay

## 2023-07-11 ENCOUNTER — Ambulatory Visit
Admission: RE | Admit: 2023-07-11 | Discharge: 2023-07-11 | Disposition: A | Discharge status: Home / Self Care | Payer: Medicare Other | Source: Ambulatory Visit | Attending: Nurse Practitioner | Admitting: Nurse Practitioner

## 2023-07-11 VITALS — BP 120/72 | HR 49 | Temp 98.0°F | Resp 20

## 2023-07-11 DIAGNOSIS — H6121 Impacted cerumen, right ear: Secondary | ICD-10-CM

## 2023-07-11 NOTE — ED Triage Notes (Addendum)
Pt reports was using debrox drops yesterday in right ear and reports cannot hear anything ever since. Denies any known injury.

## 2023-07-11 NOTE — Discharge Instructions (Signed)
We were able to rinse all of the earwax from your right ear today.  You can use over-the-counter Debrox drops to help clear of earwax if this recurs.  Continue to avoid using Q-tips.

## 2023-07-11 NOTE — ED Provider Notes (Signed)
[facility]    CSN: [id number] Arrival date & time: 07/11/23  0844      History   Chief Complaint Chief Complaint  Patient presents with   Ear Fullness    I have water in my ear and cannot hear. - Entered by patient    HPI Pamela Yang is a 72 y.o. female.   Patient presents today for right ear clogged sensation and decreased hearing.  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  Reports currently, she is unable to hear from the right ear even with the hearing aid.  Reports she put a little bit of water in her right ear yesterday to help flush it out without improvement.  No drainage from the ear, fever, cough, congestion, or sore throat.  Does not use Q-tips regularly.    Past Medical History:  Diagnosis Date   Atrial fibrillation (HCC)    Dysrhythmia    GAD (generalized anxiety disorder)    Gastroparesis 04/2007   Dr. 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   SVT (supraventricular tachycardia) 05/22/2020   Pain in joint of right ankle 12/08/2018   Depression with anxiety 02/28/2018   Osteoporosis 08/13/2014   DRY MOUTH 03/31/2009   GASTROPARESIS 03/31/2009    Past Surgical History:  Procedure Laterality Date   A-FLUTTER ABLATION N/A 03/15/2023   Procedure: A-FLUTTER ABLATION;  Surgeon: Maurice Small, MD;  Location: [facility];  Service: Cardiovascular;  Laterality: N/A;   COLONOSCOPY  05/20/2011   Dr Karilyn Cota   COLONOSCOPY N/A 07/08/2021   Procedure: COLONOSCOPY;  Surgeon: Malissa Hippo, MD;  [facility]: [facility];  Service: Endoscopy;   Laterality: N/A;  10:55   TONSILLECTOMY      OB History   No obstetric history on file.      Home Medications    Prior to Admission medications   Medication Sig Start Date End Date Taking? Authorizing Provider  XARELTO 15 MG TABS tablet TAKE ONE (1) TABLET BY MOUTH EVERY DAY 05/22/23   Antoine Poche, MD  Ascorbic Acid (VITAMIN C PO) Take 1,000 mg by mouth daily.    [provider]  denosumab (PROLIA) 60 MG/ML SOSY injection Inject 60 mg into the skin every 6 (six) months.    [provider]  Flaxseed, Linseed, (FLAXSEED OIL) 1200 MG CAPS Take 1,200 mg by mouth 2 (two) times daily.    [provider]  metoprolol tartrate (LOPRESSOR) 25 MG tablet Take 1 tablet (25 mg total) by mouth 2 (two) times daily as needed (Palpitations and elevated heart rate). 02/06/23   Sabas Sous, MD  Multiple Vitamins-Minerals (MULTIVITAMIN WITH MINERALS) tablet Take 1 tablet by mouth daily.    [provider]  Propylene Glycol (SYSTANE BALANCE) 0.6 % SOLN Place 2 drops into both eyes in the morning and at bedtime. Into both eyes    [provider]  sertraline (ZOLOFT) 50 MG tablet 1 by mouth PO QD 01/27/23   Campbell Riches, NP    Family History Family History  Problem Relation Age of Onset   Heart attack Mother    Ovarian cancer Sister    Hypertension Sister    Hypertension Sister    Osteoporosis Sister    Coronary artery disease Brother     Social History Social History   Tobacco Use   Smoking status: Former    Current packs/day: 0.00    Types: Cigarettes    Quit date: 12/20/1979    Years since quitting: 43.5   Smokeless tobacco: Never  Vaping Use   Vaping status: Never Used  Substance Use Topics   Alcohol use: Yes    Alcohol/week: 7.0 standard drinks of alcohol    Types: 7 Glasses of wine per week    Comment: occ   Drug use: Never     Allergies   Patient has no known allergies.   Review of Systems Review of Systems Per  HPI  Physical Exam Triage Vital Signs ED Triage Vitals  Encounter Vitals Group     BP 07/11/23 0915 120/72     Systolic BP Percentile --      Diastolic BP Percentile --      Pulse Rate 07/11/23 0915 (!) 49     Resp 07/11/23 0915 20     Temp 07/11/23 0915 98 F (36.7 C)     Temp Source 07/11/23 0915 Oral     SpO2 07/11/23 0915 96 %     Weight --      Height --      Head Circumference --      Peak Flow --      Pain Score 07/11/23 0912 0     Pain Loc --      Pain Education --      Exclude from Growth Chart --    No data found.  Updated Vital Signs BP 120/72 (BP Location: Right Arm)   Pulse (!) 49   Temp 98 F (36.7 C) (Oral)   Resp 20   SpO2 96%   Visual Acuity Right Eye Distance:   Left Eye Distance:   Bilateral Distance:    Right Eye Near:   Left Eye Near:    Bilateral Near:     Physical Exam Vitals and nursing note reviewed.  Constitutional:      General: She is not in acute distress.    Appearance: Normal appearance. She is not toxic-appearing.  HENT:     Head: Normocephalic and atraumatic.     Right Ear: There is impacted cerumen.     Left Ear: Tympanic membrane, ear canal and external ear normal.     Nose: Nose normal. No congestion or rhinorrhea.     Mouth/Throat:     Mouth: Mucous membranes are moist.     Pharynx: Oropharynx is clear.  Pulmonary:     Effort: Pulmonary effort is normal. No respiratory distress.  Musculoskeletal:     Cervical back: Normal range of motion.  Lymphadenopathy:     Cervical: No cervical adenopathy.  Skin:    General: Skin is warm and dry.     Capillary Refill: Capillary refill takes less than 2 seconds.     Coloration: Skin is not jaundiced or pale.     Findings: No erythema.  Neurological:     Mental Status: She is alert and oriented to person, place, and time.  Psychiatric:        Behavior: Behavior is cooperative.      UC Treatments / Results  Labs (all labs ordered are listed,  but only abnormal results  are displayed) Labs Reviewed - No data to display  EKG   Radiology No results found.  Procedures Ear Cerumen Removal  Date/Time: 07/11/2023 10:12 AM  Performed by: Valentino Nose, NP Authorized by: Valentino Nose, NP   Consent:    Consent obtained:  Verbal   Consent given by:  Patient   Risks, benefits, and alternatives were discussed: yes     Risks discussed:  Pain, TM perforation, incomplete removal and dizziness   Alternatives discussed:  Alternative treatment Universal protocol:    Procedure explained and questions answered to patient or proxy's satisfaction: yes     Patient identity confirmed:  Verbally with patient Procedure details:    Location:  R ear   Procedure type: irrigation     Procedure outcomes: cerumen removed   Post-procedure details:    Inspection:  Ear canal clear, no bleeding and TM intact   Hearing quality:  Improved   Procedure completion:  Tolerated well, no immediate complications  (including critical care time)  Medications Ordered in UC Medications - No data to display  Initial Impression / Assessment and Plan / UC Course  I have reviewed the triage vital signs and the nursing notes.  Pertinent labs & imaging results that were available during my care of the patient were reviewed by me and considered in my medical decision making (see chart for details).   Patient is well-appearing, normotensive, afebrile, not tachycardic, not tachypneic, oxygenating well on room air.    1. Excessive cerumen in right ear canal Ear lavage provided as above Patient tolerated well Tympanic membrane intact after removal Recommended Debrox drops if symptoms recur  The patient was given the opportunity to ask questions.  All questions answered to their satisfaction.  The patient is in agreement to this plan.    Final Clinical Impressions(s) / UC Diagnoses   Final diagnoses:  Excessive cerumen in right ear canal     Discharge Instructions       We were able to rinse all of the earwax from your right ear today.  You can use over-the-counter Debrox drops to help clear of earwax if this recurs.  Continue to avoid using Q-tips.    ED Prescriptions   None    PDMP not reviewed this encounter.   Valentino Nose, NP 07/11/23 1013

## 2023-08-10 DIAGNOSIS — M810 Age-related osteoporosis without current pathological fracture: Secondary | ICD-10-CM | POA: Diagnosis not present

## 2023-08-24 ENCOUNTER — Emergency Department (HOSPITAL_COMMUNITY)
Admission: EM | Admit: 2023-08-24 | Discharge: 2023-08-24 | Disposition: A | Discharge status: Home / Self Care | Payer: Medicare Other | Attending: Emergency Medicine | Admitting: Emergency Medicine

## 2023-08-24 ENCOUNTER — Encounter (HOSPITAL_COMMUNITY): Payer: Self-pay | Admitting: Emergency Medicine

## 2023-08-24 ENCOUNTER — Telehealth: Payer: Self-pay | Admitting: Student

## 2023-08-24 ENCOUNTER — Other Ambulatory Visit: Payer: Self-pay

## 2023-08-24 ENCOUNTER — Emergency Department (HOSPITAL_COMMUNITY): Payer: Medicare Other

## 2023-08-24 DIAGNOSIS — I4892 Unspecified atrial flutter: Secondary | ICD-10-CM | POA: Diagnosis not present

## 2023-08-24 DIAGNOSIS — I4891 Unspecified atrial fibrillation: Secondary | ICD-10-CM | POA: Diagnosis not present

## 2023-08-24 DIAGNOSIS — I700 Atherosclerosis of aorta: Secondary | ICD-10-CM | POA: Diagnosis not present

## 2023-08-24 DIAGNOSIS — Z8679 Personal history of other diseases of the circulatory system: Secondary | ICD-10-CM | POA: Diagnosis not present

## 2023-08-24 DIAGNOSIS — J439 Emphysema, unspecified: Secondary | ICD-10-CM | POA: Diagnosis not present

## 2023-08-24 DIAGNOSIS — R42 Dizziness and giddiness: Secondary | ICD-10-CM | POA: Diagnosis not present

## 2023-08-24 DIAGNOSIS — R002 Palpitations: Secondary | ICD-10-CM | POA: Diagnosis not present

## 2023-08-24 DIAGNOSIS — Z7901 Long term (current) use of anticoagulants: Secondary | ICD-10-CM | POA: Diagnosis not present

## 2023-08-24 LAB — BASIC METABOLIC PANEL
Anion gap: 10 (ref 5–15)
BUN: 20 mg/dL (ref 8–23)
CO2: 27 mmol/L (ref 22–32)
Calcium: 9.7 mg/dL (ref 8.9–10.3)
Chloride: 101 mmol/L (ref 98–111)
Creatinine, Ser: 0.86 mg/dL (ref 0.44–1.00)
GFR, Estimated: >60 mL/min (ref >60)
Glucose, Bld: 136 mg/dL — ABNORMAL HIGH (ref 70–99)
Potassium: 3.8 mmol/L (ref 3.5–5.1)
Sodium: 138 mmol/L (ref 135–145)

## 2023-08-24 LAB — CBC
HCT: 40.4 % (ref 36.0–46.0)
Hemoglobin: 12.6 g/dL (ref 12.0–15.0)
MCH: 27.2 pg (ref 26.0–34.0)
MCHC: 31.2 g/dL (ref 30.0–36.0)
MCV: 87.3 fL (ref 80.0–100.0)
Platelets: 173 10*3/uL (ref 150–400)
RBC: 4.63 MIL/uL (ref 3.87–5.11)
RDW: 14.2 % (ref 11.5–15.5)
WBC: 5.2 10*3/uL (ref 4.0–10.5)
nRBC: 0 % (ref 0.0–0.2)

## 2023-08-24 MED ORDER — ETOMIDATE 2 MG/ML IV SOLN
10.0000 mg | Freq: Once | INTRAVENOUS | Status: DC
  Filled 2023-08-24: qty 10

## 2023-08-24 NOTE — ED Provider Notes (Signed)
[place] EMER[facility]ENC[facility] DEPARTMENT AT [facility] Provider Note   CSN: [id number] Arrival date & time: 08/24/23  1926     History No chief complaint on file.   HPI Pamela Yang is a 72 y.o. female presenting for palpitations. The patient presents to the emergency room with a chief complaint of atrial fibrillation (AFib) versus atrial flutter, as noted by their cardiology office. The patient has a history of successful ablations and has been taking xarelto without any missed doses in the last two weeks. Despite medication compliance with metoprolol today, the patient's symptoms have not improved. The patient is agreeable to undergo a cardioversion procedure to attempt to restore normal heart rhythm, as recommended by their cardiology team per the EMR. The patient understands the risks and potential outcomes of the procedure, including the possibility of it not working and the need for additional attempts or alternative treatments.  Patient's recorded medical, surgical, social, medication list and allergies were reviewed in the Snapshot window as part of the initial history.   Review of Systems   Review of Systems  Constitutional:  Negative for chills and fever.  HENT:  Negative for ear pain and sore throat.   Eyes:  Negative for pain and visual disturbance.  Respiratory:  Negative for cough and shortness of breath.   Cardiovascular:  Positive for palpitations. Negative for chest pain.  Gastrointestinal:  Negative for abdominal pain and vomiting.  Genitourinary:  Negative for dysuria and hematuria.  Musculoskeletal:  Negative for arthralgias and back pain.  Skin:  Negative for color change and rash.  Neurological:  Negative for seizures and syncope.  All other systems reviewed and are negative.   Physical Exam Updated Vital Signs BP 99/63   Pulse (!) 46   Temp 97.9 F (36.6 C) (Oral)   Resp 14   Ht 5\' 4"  (1.626 m)   Wt 48.1 kg   SpO2 100%   BMI 18.19 kg/m   Physical Exam Vitals and nursing note reviewed.  Constitutional:      General: She is not in acute distress.    Appearance: She is well-developed.  HENT:     Head: Normocephalic and atraumatic.  Eyes:     Conjunctiva/sclera: Conjunctivae normal.  Cardiovascular:     Rate and Rhythm: Normal rate and regular rhythm.     Heart sounds: No murmur heard. Pulmonary:     Effort: Pulmonary effort is normal. No respiratory distress.     Breath sounds: Normal breath sounds.  Abdominal:     General: There is no distension.     Palpations: Abdomen is soft.     Tenderness: There is no abdominal tenderness. There is no right CVA tenderness or left CVA tenderness.  Musculoskeletal:        General: No swelling or tenderness. Normal range of motion.     Cervical back: Neck supple.  Skin:    General: Skin is warm and dry.  Neurological:     General: No focal deficit present.     Mental Status: She is alert and oriented to person, place, and time. Mental status is at baseline.     Cranial Nerves: No cranial nerve deficit.      ED Course/ Medical Decision Making/ A&P    Procedures Procedures   Medications Ordered in ED Medications  etomidate (AMIDATE) injection 10 mg (has no administration in time range)    Medical Decision Making:    Pamela Yang is a 72 y.o. female who presented  to the ED today with palpitations detailed above.     Additional history discussed with patient's family/caregivers.  Patient placed on continuous vitals and telemetry monitoring while in ED which was reviewed periodically.   Complete initial physical exam performed, notably the patient  was HDS in NAD.      Reviewed and confirmed nursing documentation for past medical history, family history, social history.    Initial Assessment:   #Atrial Fibrillation:    - Patient presents with atrial fibrillation, confirmed by 12-lead ECG. History of successful ablations and consistent Xarelto use without  missed doses in the last two weeks. Cardiology team recommends cardioversion.    - Plan: Proceed with electrical cardioversion under sedation using etomidate. Closely monitor patient's breathing during the procedure, ready to assist if necessary. Attempt cardioversion twice; if unsuccessful, explore alternative treatments. Post-procedure, if patient is stable and blood work normal, consider discussing discharge.  #Sedation for Cardioversion:    - Patient to be sedated with etomidate for the cardioversion procedure.    - Plan: Administer etomidate for sedation. Monitor vital signs and breathing closely during the procedure. Provide respiratory support as needed. Ensure patient understands risks associated with sedation and cardioversion.  #Post-Cardioversion Monitoring:    - Close monitoring required following the cardioversion procedure.    - Plan: Monitor vital signs, ECG, and breathing for 15-20 minutes post-procedure. Check blood work to confirm it is within normal limits. If patient remains stable and blood work is satisfactory, discuss potential discharge.  Initial Study Results:   Laboratory  All laboratory results reviewed without evidence of clinically relevant pathology.    Reassessment and Plan:   I was called back to bedside as her symptoms resolved.  Observed in emergency department for 2 hours after resolution of symptoms with no further A-flutter A-fib RVR.  She does have bradycardia but normal blood pressure.  Likely the effect of her metoprolol.  She states she is asymptomatic without lightheadedness.  Given resolution of symptoms after prolonged observation period, patient is stable for discharge with close outpatient follow-up with PCP.   Disposition:  I have considered need for hospitalization, however, considering all of the above, I believe this patient is stable for discharge at this time.  Patient/family educated about specific return precautions for given chief complaint  and symptoms.  Patient/family educated about follow-up with PCP .     Patient/family expressed understanding of return precautions and need for follow-up. Patient spoken to regarding all imaging and laboratory results and appropriate follow up for these results. All education provided in verbal form with additional information in written form. Time was allowed for answering of patient questions. Patient discharged.    Emergency Department Medication Summary:   Medications  etomidate (AMIDATE) injection 10 mg (has no administration in time range)         Clinical Impression:  1. History of atrial flutter   2. Palpitations      Discharge   Final Clinical Impression(s) / ED Diagnoses Final diagnoses:  History of atrial flutter  Palpitations    Rx / DC Orders ED Discharge Orders     None         Glyn Ade, MD 08/24/23 2359

## 2023-08-24 NOTE — ED Notes (Signed)
Dc instructions reviewed with pt no questions or concerns at this time. Will follow up with cardiology. 

## 2023-08-24 NOTE — Telephone Encounter (Signed)
   Patient called Pamela Yang with concerns that she was back in atrial flutter. I called and spoke with patient. She had an atrial flutter ablation in 02/2023 and has done well since then until today. She states she feels like she has been back in atrial flutter for the past 4 hours. She describes heart racing, weakness, and dizziness. She denies any chest pain, shortness of breath, or syncope. She has PRN Lopressor at home and took 25mg  of this around 3:30pm. Her BP was 120/79 and her HR was 120 about 15 minutes before calling. She is wanting to  know whether she should go to the ED. I had initially considered just having her take an additional 25mg  of Lopressor and see if that helps before coming to the ED. However, upon further chart review, it looks like her heart rates are in the 40s when she is in sinus rhythm so I am hesitant to recommend this in case she converts. I think the best option is for her to go to the ED - she has not missed any dose of her Xarelto so if she is indeed in atrial flutter/ fibrillation (which I suspect she is), then they may just be able to cardiovert her and send her home. Patient voiced understanding.  I will route this note to the EP scheduling to pool to see if they can go a head and schedule her a follow-up visit. I will also route note to Dr. Nelly Laurence so he is aware.  Corrin Parker, PA-C 08/24/2023 7:35 PM

## 2023-08-24 NOTE — ED Triage Notes (Addendum)
Pt to ed pov. C/o of being in afib since 3/330pm this afternoon. Pt took a dose of metoprolol at 330 and advised not to take any more by pt's provider. Pt states feels weak in legs, lightheaded, and dizzy at this time.

## 2023-08-24 NOTE — ED Notes (Signed)
Preparing for cardioversion when pt states I think I converted. Per monitor pt heart rate in 50s. EKG captured and given to EDP. EDP states pt has converted and we will monitor at this time.

## 2023-08-31 ENCOUNTER — Encounter (HOSPITAL_COMMUNITY): Payer: Self-pay

## 2023-08-31 ENCOUNTER — Emergency Department (HOSPITAL_COMMUNITY): Payer: Medicare Other

## 2023-08-31 ENCOUNTER — Encounter: Payer: Self-pay | Admitting: Cardiology

## 2023-08-31 ENCOUNTER — Other Ambulatory Visit: Payer: Self-pay

## 2023-08-31 ENCOUNTER — Emergency Department (HOSPITAL_COMMUNITY)
Admission: EM | Admit: 2023-08-31 | Discharge: 2023-09-01 | Disposition: A | Discharge status: Home / Self Care | Payer: Medicare Other | Attending: Emergency Medicine | Admitting: Emergency Medicine

## 2023-08-31 DIAGNOSIS — R0602 Shortness of breath: Secondary | ICD-10-CM | POA: Diagnosis not present

## 2023-08-31 DIAGNOSIS — I4719 Other supraventricular tachycardia: Secondary | ICD-10-CM | POA: Diagnosis not present

## 2023-08-31 DIAGNOSIS — Z79899 Other long term (current) drug therapy: Secondary | ICD-10-CM | POA: Diagnosis not present

## 2023-08-31 DIAGNOSIS — R5383 Other fatigue: Secondary | ICD-10-CM | POA: Diagnosis not present

## 2023-08-31 DIAGNOSIS — Z7901 Long term (current) use of anticoagulants: Secondary | ICD-10-CM | POA: Diagnosis not present

## 2023-08-31 DIAGNOSIS — Z8041 Family history of malignant neoplasm of ovary: Secondary | ICD-10-CM | POA: Diagnosis not present

## 2023-08-31 DIAGNOSIS — Z87891 Personal history of nicotine dependence: Secondary | ICD-10-CM | POA: Diagnosis not present

## 2023-08-31 DIAGNOSIS — R002 Palpitations: Secondary | ICD-10-CM | POA: Diagnosis not present

## 2023-08-31 DIAGNOSIS — I471 Supraventricular tachycardia, unspecified: Secondary | ICD-10-CM

## 2023-08-31 DIAGNOSIS — M810 Age-related osteoporosis without current pathological fracture: Secondary | ICD-10-CM | POA: Diagnosis not present

## 2023-08-31 DIAGNOSIS — R079 Chest pain, unspecified: Secondary | ICD-10-CM | POA: Diagnosis not present

## 2023-08-31 DIAGNOSIS — R001 Bradycardia, unspecified: Secondary | ICD-10-CM | POA: Diagnosis not present

## 2023-08-31 DIAGNOSIS — J929 Pleural plaque without asbestos: Secondary | ICD-10-CM | POA: Diagnosis not present

## 2023-08-31 DIAGNOSIS — F411 Generalized anxiety disorder: Secondary | ICD-10-CM | POA: Diagnosis not present

## 2023-08-31 DIAGNOSIS — Z8262 Family history of osteoporosis: Secondary | ICD-10-CM | POA: Diagnosis not present

## 2023-08-31 DIAGNOSIS — R55 Syncope and collapse: Secondary | ICD-10-CM | POA: Diagnosis not present

## 2023-08-31 DIAGNOSIS — I4891 Unspecified atrial fibrillation: Secondary | ICD-10-CM | POA: Diagnosis not present

## 2023-08-31 DIAGNOSIS — K3184 Gastroparesis: Secondary | ICD-10-CM | POA: Diagnosis not present

## 2023-08-31 DIAGNOSIS — K219 Gastro-esophageal reflux disease without esophagitis: Secondary | ICD-10-CM | POA: Diagnosis not present

## 2023-08-31 DIAGNOSIS — N289 Disorder of kidney and ureter, unspecified: Secondary | ICD-10-CM | POA: Diagnosis not present

## 2023-08-31 DIAGNOSIS — D6869 Other thrombophilia: Secondary | ICD-10-CM | POA: Diagnosis not present

## 2023-08-31 DIAGNOSIS — H9190 Unspecified hearing loss, unspecified ear: Secondary | ICD-10-CM | POA: Diagnosis not present

## 2023-08-31 DIAGNOSIS — Z634 Disappearance and death of family member: Secondary | ICD-10-CM | POA: Diagnosis not present

## 2023-08-31 DIAGNOSIS — Z8249 Family history of ischemic heart disease and other diseases of the circulatory system: Secondary | ICD-10-CM | POA: Diagnosis not present

## 2023-08-31 DIAGNOSIS — I4892 Unspecified atrial flutter: Secondary | ICD-10-CM | POA: Diagnosis not present

## 2023-08-31 DIAGNOSIS — R000 Tachycardia, unspecified: Secondary | ICD-10-CM | POA: Diagnosis not present

## 2023-08-31 HISTORY — DX: Supraventricular tachycardia, unspecified: I47.10

## 2023-08-31 LAB — CBC WITH DIFFERENTIAL/PLATELET
Abs Immature Granulocytes: 0.02 10*3/uL (ref 0.00–0.07)
Basophils Absolute: 0.1 10*3/uL (ref 0.0–0.1)
Basophils Relative: 1 %
Eosinophils Absolute: 0.2 10*3/uL (ref 0.0–0.5)
Eosinophils Relative: 2 %
HCT: 40.9 % (ref 36.0–46.0)
Hemoglobin: 13.3 g/dL (ref 12.0–15.0)
Immature Granulocytes: 0 %
Lymphocytes Relative: 45 %
Lymphs Abs: 3.9 10*3/uL (ref 0.7–4.0)
MCH: 27.9 pg (ref 26.0–34.0)
MCHC: 32.5 g/dL (ref 30.0–36.0)
MCV: 85.7 fL (ref 80.0–100.0)
Monocytes Absolute: 0.7 10*3/uL (ref 0.1–1.0)
Monocytes Relative: 8 %
Neutro Abs: 3.9 10*3/uL (ref 1.7–7.7)
Neutrophils Relative %: 44 %
Platelets: 208 10*3/uL (ref 150–400)
RBC: 4.77 MIL/uL (ref 3.87–5.11)
RDW: 14.2 % (ref 11.5–15.5)
WBC: 8.8 10*3/uL (ref 4.0–10.5)
nRBC: 0 % (ref 0.0–0.2)

## 2023-08-31 LAB — TROPONIN I (HIGH SENSITIVITY)
Troponin I (High Sensitivity): 6 ng/L (ref <18)
Troponin I (High Sensitivity): 13 ng/L (ref <18)

## 2023-08-31 LAB — COMPREHENSIVE METABOLIC PANEL
ALT: 30 U/L (ref 0–44)
AST: 44 U/L — ABNORMAL HIGH (ref 15–41)
Albumin: 4.4 g/dL (ref 3.5–5.0)
Alkaline Phosphatase: 69 U/L (ref 38–126)
Anion gap: 14 (ref 5–15)
BUN: 27 mg/dL — ABNORMAL HIGH (ref 8–23)
CO2: 22 mmol/L (ref 22–32)
Calcium: 10.2 mg/dL (ref 8.9–10.3)
Chloride: 101 mmol/L (ref 98–111)
Creatinine, Ser: 0.92 mg/dL (ref 0.44–1.00)
GFR, Estimated: >60 mL/min (ref >60)
Glucose, Bld: 131 mg/dL — ABNORMAL HIGH (ref 70–99)
Potassium: 3.6 mmol/L (ref 3.5–5.1)
Sodium: 137 mmol/L (ref 135–145)
Total Bilirubin: 0.6 mg/dL (ref 0.3–1.2)
Total Protein: 7.7 g/dL (ref 6.5–8.1)

## 2023-08-31 NOTE — ED Notes (Signed)
Pt is tachy, she reports sob and appears tachypneic.  When zoll pads were placed on pt her HR slowed and she reports that she immediately began feeling better

## 2023-08-31 NOTE — ED Triage Notes (Signed)
Thursday dx'ed with AFIB SOB x1 day HR greater than 100 x3 days

## 2023-08-31 NOTE — ED Provider Notes (Signed)
EMERGENCY DEPARTMENT AT [facility] Provider Note   CSN: [id number] Arrival date & time: 08/31/23  1930     History  Chief Complaint  Patient presents with   Shortness of Breath    Pamela Yang is a 72 y.o. female.  Patient has a history of atrial fibs.  She is presently taking Xarelto.  Patient states that she has been having palpitations today and she took her metoprolol twice today.  The history is provided by the patient and medical records. No language interpreter was used.  Shortness of Breath Severity:  Mild Onset quality:  Sudden Timing:  Constant Progression:  Unchanged Chronicity:  New Context: activity   Relieved by:  Nothing Worsened by:  Nothing Ineffective treatments:  None tried Associated symptoms: no abdominal pain, no chest pain, no cough, no headaches and no rash        Home Medications Prior to Admission medications   Medication Sig Start Date End Date Taking? Authorizing Provider  Ascorbic Acid (VITAMIN C PO) Take 500 mg by mouth in the morning and at bedtime.   Yes [provider]  denosumab (PROLIA) 60 MG/ML SOSY injection Inject 60 mg into the skin every 6 (six) months.   Yes [provider]  Flaxseed, Linseed, (FLAXSEED OIL) 1200 MG CAPS Take 1,200 mg by mouth 2 (two) times daily.   Yes [provider]  metoprolol tartrate (LOPRESSOR) 25 MG tablet Take 1 tablet (25 mg total) by mouth 2 (two) times daily as needed (Palpitations and elevated heart rate). 02/06/23  Yes Sabas Sous, MD  Multiple Vitamins-Minerals (MULTIVITAMIN WITH MINERALS) tablet Take 1 tablet by mouth daily.   Yes [provider]  Propylene Glycol (SYSTANE BALANCE) 0.6 % SOLN Place 2 drops into both eyes in the morning and at bedtime. Into both eyes   Yes [provider]  sertraline (ZOLOFT) 50 MG tablet 1 by mouth PO QD 01/27/23  Yes Campbell Riches, NP  XARELTO 15 MG TABS tablet TAKE ONE (1) TABLET BY MOUTH  EVERY DAY 05/22/23  Yes Branch, Dorothe Pea, MD      Allergies    Patient has no known allergies.    Review of Systems   Review of Systems  Constitutional:  Negative for appetite change and fatigue.  HENT:  Negative for congestion, ear discharge and sinus pressure.   Eyes:  Negative for discharge.  Respiratory:  Positive for shortness of breath. Negative for cough.   Cardiovascular:  Positive for palpitations. Negative for chest pain.  Gastrointestinal:  Negative for abdominal pain and diarrhea.  Genitourinary:  Negative for frequency and hematuria.  Musculoskeletal:  Negative for back pain.  Skin:  Negative for rash.  Neurological:  Negative for seizures and headaches.  Psychiatric/Behavioral:  Negative for hallucinations.     Physical Exam Updated Vital Signs BP (!) 121/93   Pulse (!) 102   Temp 97.8 F (36.6 C) (Oral)   Resp 18   Ht 5\' 4"  (1.626 m)   Wt 47.6 kg   SpO2 100%   BMI 18.02 kg/m  Physical Exam Vitals and nursing note reviewed.  Constitutional:      Appearance: She is well-developed. She is ill-appearing.  HENT:     Head: Normocephalic.     Nose: Nose normal.  Eyes:     General: No scleral icterus.    Conjunctiva/sclera: Conjunctivae normal.  Neck:     Thyroid: No thyromegaly.  Cardiovascular:     Rate and  Rhythm: Regular rhythm. Tachycardia present.     Heart sounds: No murmur heard.    No friction rub. No gallop.  Pulmonary:     Breath sounds: No stridor. No wheezing or rales.  Chest:     Chest wall: No tenderness.  Abdominal:     General: There is no distension.     Tenderness: There is no abdominal tenderness. There is no rebound.  Musculoskeletal:        General: Normal range of motion.     Cervical back: Neck supple.  Lymphadenopathy:     Cervical: No cervical adenopathy.  Skin:    Findings: No erythema or rash.  Neurological:     Mental Status: She is alert and oriented to person, place, and time.     Motor: No abnormal muscle tone.      Coordination: Coordination normal.  Psychiatric:        Behavior: Behavior normal.     ED Results / Procedures / Treatments   Labs (all labs ordered are listed, but only abnormal results are displayed) Labs Reviewed  COMPREHENSIVE METABOLIC PANEL - Abnormal; Notable for the following components:      Result Value   Glucose, Bld 131 (*)    BUN 27 (*)    AST 44 (*)    All other components within normal limits  CBC WITH DIFFERENTIAL/PLATELET  TROPONIN I (HIGH SENSITIVITY)  TROPONIN I (HIGH SENSITIVITY)    EKG None  Radiology DG Chest Port 1 View  Result Date: 08/31/2023 CLINICAL DATA:  Shortness of breath EXAM: PORTABLE CHEST 1 VIEW COMPARISON:  Chest x-ray 08/24/2023 FINDINGS: The lungs are hyperinflated, unchanged. The heart size and mediastinal contours are within normal limits. Both lungs are clear. The visualized skeletal structures are unremarkable. IMPRESSION: No active disease. The lungs are hyperinflated, unchanged. Electronically Signed   By: Darliss Cheney M.D.   On: 08/31/2023 21:32    Procedures Procedures    Medications Ordered in ED Medications - No data to display  ED Course/ Medical Decision Making/ A&P   CRITICAL CARE Performed by: Bethann Berkshire Total critical care time: 45 minutes Critical care time was exclusive of separately billable procedures and treating other patients. Critical care was necessary to treat or prevent imminent or life-threatening deterioration. Critical care was time spent personally by me on the following activities: development of treatment plan with patient and/or surrogate as well as nursing, discussions with consultants, evaluation of patient's response to treatment, examination of patient, obtaining history from patient or surrogate, ordering and performing treatments and interventions, ordering and review of laboratory studies, ordering and review of radiographic studies, pulse oximetry and re-evaluation of patient's  condition.   Patient arrived in a SVT with a rate over 200.  She converted quickly on her own to a normal sinus rhythm.  Labs and x-rays were unremarkable Click here for ABCD2, HEART and other calculatorsREFRESH Note before signing :1}                              Medical Decision Making Amount and/or Complexity of Data Reviewed Labs: ordered. Radiology: ordered. ECG/medicine tests: ordered.  This patient presents to the ED for concern of palpitations, this involves an extensive number of treatment options, and is a complaint that carries with it a high risk of complications and morbidity.  The differential diagnosis includes MI, SVT, atrial fibs   Co morbidities that complicate the patient evaluation  Atrial fibs  Additional history obtained:  Additional history obtained from patient External records from outside source obtained and reviewed including full records   Lab Tests:  I Ordered, and personally interpreted labs.  The pertinent results include: Troponin is normal   Imaging Studies ordered:  I ordered imaging studies including chest x-ray I independently visualized and interpreted imaging which showed negative I agree with the radiologist interpretation   Cardiac Monitoring: / EKG:  The patient was maintained on a cardiac monitor.  I personally viewed and interpreted the cardiac monitored which showed an underlying rhythm of: Rapid rate at 200 probable SVT   Consultations Obtained:  No consultant Problem List / ED Course / Critical interventions / Medication management  SVT and atrial fibs No medicines given Reevaluation of the patient after these medicines showed that the patient improved I have reviewed the patients home medicines and have made adjustments as needed   Social Determinants of Health:  None   Test / Admission - Considered:  None  SVT that resolved spontaneously.  Patient will continue with her metoprolol and see her cardiologist  tomorrow        Final Clinical Impression(s) / ED Diagnoses Final diagnoses:  SVT (supraventricular tachycardia)    Rx / DC Orders ED Discharge Orders     None         Bethann Berkshire, MD 09/01/23 1222

## 2023-08-31 NOTE — Discharge Instructions (Signed)
Take your metoprolol as needed and keep your appointment tomorrow

## 2023-08-31 NOTE — ED Notes (Signed)
12 lead handed to MD Informed of pt's HR and condition in triage

## 2023-09-01 ENCOUNTER — Ambulatory Visit: Payer: Medicare Other | Admitting: Cardiology

## 2023-09-01 ENCOUNTER — Other Ambulatory Visit: Payer: Self-pay

## 2023-09-01 MED ORDER — POTASSIUM CHLORIDE CRYS ER 20 MEQ PO TBCR: EXTENDED_RELEASE_TABLET | ORAL | 0 refills | Status: DC

## 2023-09-01 NOTE — Progress Notes (Deleted)
Clinical Summary Ms. Debose is a 72 y.o.female  seen today for follow up of the following medical problems.    1.Aflutter - diagnosed 05/2020 - was on Flecainide 50 mg twice daily as she was previously intolerant to baseline AV nodal blocking agents given her bradycardia.  - flecanide stopped due to bradycardia, junctional rhythm  02/2023 aflutter ablation: CTI for aflutter, RFA for SVT    1. Chest pain/Palpitations - seen in ER 03/09/20 with chest pain and palpitations - hstrop neg x 2. EKG SR, nonspecific ST/T changes   - episode occurred at home. Feeling of heart racing, about to beat out of chest. Dizziness, mild tightness.  - lasted about 45 minutes. Self resolved - coffee x 1-2 cups, rare sodas, decaf tea, occasional EtOH - occasional palpitations, 2-3 times per week. Lasts just a few seconds.      03/2020 14 day event monitor Min HR 41, Max HR 113, Avg HR 56. Min HR occurred in early AM hours presumably while sleeping Reported symptoms correlated with mild sinus bradycardia high 50s No significant arrhythmias. Mildly decreased average heart rate.     - palpitaitons have resolved since last visit. - no lightheadedness or dizziness.  Past Medical History:  Diagnosis Date   Atrial fibrillation (HCC)    Dysrhythmia    GAD (generalized anxiety disorder)    Gastroparesis 04/2007   Dr. Cira Servant   GERD (gastroesophageal reflux disease)    Hearing loss    Osteoporosis      No Known Allergies   Current Outpatient Medications  Medication Sig Dispense Refill   Ascorbic Acid (VITAMIN C PO) Take 500 mg by mouth in the morning and at bedtime.     denosumab (PROLIA) 60 MG/ML SOSY injection Inject 60 mg into the skin every 6 (six) months.     Flaxseed, Linseed, (FLAXSEED OIL) 1200 MG CAPS Take 1,200 mg by mouth 2 (two) times daily.     metoprolol tartrate (LOPRESSOR) 25 MG tablet Take 1 tablet (25 mg total) by mouth 2 (two) times daily as needed (Palpitations and  elevated heart rate). 30 tablet 0   Multiple Vitamins-Minerals (MULTIVITAMIN WITH MINERALS) tablet Take 1 tablet by mouth daily.     Propylene Glycol (SYSTANE BALANCE) 0.6 % SOLN Place 2 drops into both eyes in the morning and at bedtime. Into both eyes     sertraline (ZOLOFT) 50 MG tablet 1 by mouth PO QD 90 tablet 3   XARELTO 15 MG TABS tablet TAKE ONE (1) TABLET BY MOUTH EVERY DAY 90 tablet 1   No current facility-administered medications for this visit.     Past Surgical History:  Procedure Laterality Date   A-FLUTTER ABLATION N/A 03/15/2023   Procedure: A-FLUTTER ABLATION;  Surgeon: Mealor, Roberts Gaudy, MD;  Location: [facility] INVASIVE CV LAB;  Service: Cardiovascular;  Laterality: N/A;   COLONOSCOPY  05/20/2011   Dr Karilyn Cota   COLONOSCOPY N/A 07/08/2021   Procedure: COLONOSCOPY;  Surgeon: Malissa Hippo, MD;  Location: [facility];  Service: Endoscopy;  Laterality: N/A;  10:55   TONSILLECTOMY       No Known Allergies    Family History  Problem Relation Age of Onset   Heart attack Mother    Ovarian cancer Sister    Hypertension Sister    Hypertension Sister    Osteoporosis Sister    Coronary artery disease Brother      Social History Ms. Folz reports that she quit smoking about 43 years  ago. Her smoking use included cigarettes. She has never used smokeless tobacco. Ms. Leaverton reports current alcohol use of about 7.0 standard drinks of alcohol per week.   Review of Systems CONSTITUTIONAL: No weight loss, fever, chills, weakness or fatigue.  HEENT: Eyes: No visual loss, blurred vision, double vision or yellow sclerae.No hearing loss, sneezing, congestion, runny nose or sore throat.  SKIN: No rash or itching.  CARDIOVASCULAR:  RESPIRATORY: No shortness of breath, cough or sputum.  GASTROINTESTINAL: No anorexia, nausea, vomiting or diarrhea. No abdominal pain or blood.  GENITOURINARY: No burning on urination, no polyuria NEUROLOGICAL: No headache, dizziness, syncope,  paralysis, ataxia, numbness or tingling in the extremities. No change in bowel or bladder control.  MUSCULOSKELETAL: No muscle, back pain, joint pain or stiffness.  LYMPHATICS: No enlarged nodes. No history of splenectomy.  PSYCHIATRIC: No history of depression or anxiety.  ENDOCRINOLOGIC: No reports of sweating, cold or heat intolerance. No polyuria or polydipsia.  Marland Kitchen   Physical Examination There were no vitals filed for this visit. There were no vitals filed for this visit.  Gen: resting comfortably, no acute distress HEENT: no scleral icterus, pupils equal round and reactive, no palptable cervical adenopathy,  CV Resp: Clear to auscultation bilaterally GI: abdomen is soft, non-tender, non-distended, normal bowel sounds, no hepatosplenomegaly MSK: extremities are warm, no edema.  Skin: warm, no rash Neuro:  no focal deficits Psych: appropriate affect   Diagnostic Studies     Assessment and Plan   1. Palpitations -benign event monitor other than mild bradycardia - no recurrence of palpitations, essentially isolated severe episdoe back in 02/2020 - continue to monitor at this time .     2. Sinus bradycardia - avg HR 56 on recent monitor, rates into 40s at times - asymptomatic, normal TSH, not on av nodal agents - continue to monitor at this time.      Antoine Poche, M.D., F.A.C.C.

## 2023-09-02 ENCOUNTER — Inpatient Hospital Stay (HOSPITAL_COMMUNITY)
Admission: EM | Admit: 2023-09-02 | Discharge: 2023-09-06 | DRG: 309 | Disposition: A | Discharge status: Home / Self Care | Payer: Medicare Other | Attending: Internal Medicine | Admitting: Internal Medicine

## 2023-09-02 ENCOUNTER — Encounter (HOSPITAL_COMMUNITY): Payer: Self-pay

## 2023-09-02 ENCOUNTER — Telehealth: Payer: Self-pay | Admitting: Physician Assistant

## 2023-09-02 ENCOUNTER — Other Ambulatory Visit: Payer: Self-pay

## 2023-09-02 ENCOUNTER — Emergency Department (HOSPITAL_COMMUNITY): Payer: Medicare Other

## 2023-09-02 DIAGNOSIS — M810 Age-related osteoporosis without current pathological fracture: Secondary | ICD-10-CM | POA: Diagnosis present

## 2023-09-02 DIAGNOSIS — R000 Tachycardia, unspecified: Secondary | ICD-10-CM | POA: Diagnosis present

## 2023-09-02 DIAGNOSIS — Z8041 Family history of malignant neoplasm of ovary: Secondary | ICD-10-CM | POA: Diagnosis not present

## 2023-09-02 DIAGNOSIS — R002 Palpitations: Secondary | ICD-10-CM | POA: Diagnosis present

## 2023-09-02 DIAGNOSIS — R55 Syncope and collapse: Secondary | ICD-10-CM | POA: Diagnosis present

## 2023-09-02 DIAGNOSIS — R5383 Other fatigue: Secondary | ICD-10-CM | POA: Diagnosis present

## 2023-09-02 DIAGNOSIS — Z634 Disappearance and death of family member: Secondary | ICD-10-CM | POA: Diagnosis not present

## 2023-09-02 DIAGNOSIS — I4719 Other supraventricular tachycardia: Secondary | ICD-10-CM | POA: Diagnosis present

## 2023-09-02 DIAGNOSIS — K3184 Gastroparesis: Secondary | ICD-10-CM | POA: Diagnosis present

## 2023-09-02 DIAGNOSIS — Z79899 Other long term (current) drug therapy: Secondary | ICD-10-CM

## 2023-09-02 DIAGNOSIS — D6869 Other thrombophilia: Secondary | ICD-10-CM | POA: Diagnosis present

## 2023-09-02 DIAGNOSIS — I4892 Unspecified atrial flutter: Secondary | ICD-10-CM | POA: Diagnosis present

## 2023-09-02 DIAGNOSIS — K219 Gastro-esophageal reflux disease without esophagitis: Secondary | ICD-10-CM | POA: Diagnosis present

## 2023-09-02 DIAGNOSIS — Z7901 Long term (current) use of anticoagulants: Secondary | ICD-10-CM | POA: Diagnosis not present

## 2023-09-02 DIAGNOSIS — Z87891 Personal history of nicotine dependence: Secondary | ICD-10-CM

## 2023-09-02 DIAGNOSIS — F411 Generalized anxiety disorder: Secondary | ICD-10-CM | POA: Diagnosis present

## 2023-09-02 DIAGNOSIS — Z8249 Family history of ischemic heart disease and other diseases of the circulatory system: Secondary | ICD-10-CM | POA: Diagnosis not present

## 2023-09-02 DIAGNOSIS — Z8262 Family history of osteoporosis: Secondary | ICD-10-CM | POA: Diagnosis not present

## 2023-09-02 DIAGNOSIS — N289 Disorder of kidney and ureter, unspecified: Secondary | ICD-10-CM | POA: Diagnosis present

## 2023-09-02 DIAGNOSIS — R001 Bradycardia, unspecified: Secondary | ICD-10-CM | POA: Diagnosis present

## 2023-09-02 DIAGNOSIS — I471 Supraventricular tachycardia, unspecified: Secondary | ICD-10-CM | POA: Diagnosis present

## 2023-09-02 DIAGNOSIS — H9190 Unspecified hearing loss, unspecified ear: Secondary | ICD-10-CM | POA: Diagnosis present

## 2023-09-02 DIAGNOSIS — I4891 Unspecified atrial fibrillation: Secondary | ICD-10-CM | POA: Diagnosis present

## 2023-09-02 HISTORY — DX: Other specified cardiac arrhythmias: I49.8

## 2023-09-02 LAB — COMPREHENSIVE METABOLIC PANEL
ALT: 31 U/L (ref 0–44)
AST: 36 U/L (ref 15–41)
Albumin: 4.5 g/dL (ref 3.5–5.0)
Alkaline Phosphatase: 64 U/L (ref 38–126)
Anion gap: 12 (ref 5–15)
BUN: 19 mg/dL (ref 8–23)
CO2: 21 mmol/L — ABNORMAL LOW (ref 22–32)
Calcium: 9.4 mg/dL (ref 8.9–10.3)
Chloride: 103 mmol/L (ref 98–111)
Creatinine, Ser: 1.02 mg/dL — ABNORMAL HIGH (ref 0.44–1.00)
GFR, Estimated: 58 mL/min — ABNORMAL LOW (ref >60)
Glucose, Bld: 188 mg/dL — ABNORMAL HIGH (ref 70–99)
Potassium: 3.9 mmol/L (ref 3.5–5.1)
Sodium: 136 mmol/L (ref 135–145)
Total Bilirubin: 0.9 mg/dL (ref 0.3–1.2)
Total Protein: 7.6 g/dL (ref 6.5–8.1)

## 2023-09-02 LAB — TROPONIN I (HIGH SENSITIVITY)
Troponin I (High Sensitivity): 11 ng/L (ref <18)
Troponin I (High Sensitivity): 10 ng/L (ref <18)

## 2023-09-02 LAB — CBC WITH DIFFERENTIAL/PLATELET
Abs Immature Granulocytes: 0.03 10*3/uL (ref 0.00–0.07)
Basophils Absolute: 0.1 10*3/uL (ref 0.0–0.1)
Basophils Relative: 1 %
Eosinophils Absolute: 0.1 10*3/uL (ref 0.0–0.5)
Eosinophils Relative: 1 %
HCT: 45 % (ref 36.0–46.0)
Hemoglobin: 14.2 g/dL (ref 12.0–15.0)
Immature Granulocytes: 0 %
Lymphocytes Relative: 24 %
Lymphs Abs: 1.9 10*3/uL (ref 0.7–4.0)
MCH: 27.4 pg (ref 26.0–34.0)
MCHC: 31.6 g/dL (ref 30.0–36.0)
MCV: 86.9 fL (ref 80.0–100.0)
Monocytes Absolute: 0.5 10*3/uL (ref 0.1–1.0)
Monocytes Relative: 7 %
Neutro Abs: 5.4 10*3/uL (ref 1.7–7.7)
Neutrophils Relative %: 67 %
Platelets: 207 10*3/uL (ref 150–400)
RBC: 5.18 MIL/uL — ABNORMAL HIGH (ref 3.87–5.11)
RDW: 14.3 % (ref 11.5–15.5)
WBC: 8.1 10*3/uL (ref 4.0–10.5)
nRBC: 0 % (ref 0.0–0.2)

## 2023-09-02 LAB — MAGNESIUM: Magnesium: 2.2 mg/dL (ref 1.7–2.4)

## 2023-09-02 LAB — TSH: TSH: 1.296 u[IU]/mL (ref 0.350–4.500)

## 2023-09-02 LAB — APTT: aPTT: 30 s (ref 24–36)

## 2023-09-02 MED ORDER — ONDANSETRON HCL 4 MG/2ML IJ SOLN: 4.0000 mg | Freq: Four times a day (QID) | INTRAMUSCULAR | Status: DC | PRN

## 2023-09-02 MED ORDER — ACETAMINOPHEN 650 MG RE SUPP: 650.0000 mg | Freq: Four times a day (QID) | RECTAL | Status: DC | PRN

## 2023-09-02 MED ORDER — SERTRALINE HCL 25 MG PO TABS
50.0000 mg | ORAL_TABLET | Freq: Every day | ORAL | Status: DC
  Administered 2023-09-03 – 2023-09-06 (×4): 50 mg via ORAL
  Filled 2023-09-02 (×4): qty 2

## 2023-09-02 MED ORDER — SODIUM CHLORIDE 0.9% FLUSH
3.0000 mL | Freq: Two times a day (BID) | INTRAVENOUS | Status: DC
  Administered 2023-09-02 – 2023-09-05 (×7): 3 mL via INTRAVENOUS

## 2023-09-02 MED ORDER — METOPROLOL TARTRATE 25 MG PO TABS
25.0000 mg | ORAL_TABLET | Freq: Two times a day (BID) | ORAL | Status: DC | PRN
  Administered 2023-09-03: 25 mg via ORAL
  Filled 2023-09-02: qty 1

## 2023-09-02 MED ORDER — SENNOSIDES-DOCUSATE SODIUM 8.6-50 MG PO TABS: 1.0000 | ORAL_TABLET | Freq: Every evening | ORAL | Status: DC | PRN

## 2023-09-02 MED ORDER — RIVAROXABAN 15 MG PO TABS
15.0000 mg | ORAL_TABLET | Freq: Every day | ORAL | Status: DC
  Administered 2023-09-02: 15 mg via ORAL
  Filled 2023-09-02: qty 1

## 2023-09-02 MED ORDER — ONDANSETRON HCL 4 MG PO TABS: 4.0000 mg | ORAL_TABLET | Freq: Four times a day (QID) | ORAL | Status: DC | PRN

## 2023-09-02 MED ORDER — ACETAMINOPHEN 325 MG PO TABS: 650.0000 mg | ORAL_TABLET | Freq: Four times a day (QID) | ORAL | Status: DC | PRN

## 2023-09-02 NOTE — ED Notes (Signed)
Assisted pt up to Pamela Yang with monitor still on, pt's HR at rest at 94 and increased to 105 with getting up to [facility].  Pt assisted back to bed.

## 2023-09-02 NOTE — ED Triage Notes (Signed)
SVT sent over from cardio Seen Thursday for AFIB in rate of 220

## 2023-09-02 NOTE — Telephone Encounter (Signed)
Pamela Yang is a 72 y.o. female w a hx of atrial fibrillation/flutter, junctional rhythm. Flecainide was dc'd earlier this year after junctional rhythm noted. She underwent ablation in 02/2023 for atrial flutter. Last seen by Dr. Nelly Laurence in 04/2023.   Seen in ED 08/24/23 with atrial fibrillation and underwent DCCV. EKG personally reviewed and looks similar to rhythm in 2021 that was called AFlutter.   Seen in ED 08/31/23 with Supraventricular Tachycardia HR 200. She converted spontaneously to NSR and was DC'd. EKG personally reviewed.   Pt called answering service with symptoms of fatigue, shortness of breath and tachypalpitations. She notes significant fatigue with just minimal activity. No chest pain, syncope.  PLAN:  -Advised pt to go to the ED for eval. She agrees. -Spoke with charge RN at [facility] to make sure our service is called to discuss her care. Tereso Newcomer, PA-C    09/02/2023 11:21 AM

## 2023-09-02 NOTE — H&P (Addendum)
Cardiology Admission History and Physical   Patient ID: Pamela Yang MRN: [id number]; DOB: 1951-07-24   Admission date: 09/02/2023  PCP:  Babs Sciara, MD   [facility] Providers Cardiologist:  Dina Rich, MD  Electrophysiologist:  Maurice Small, MD       Chief Complaint:  fatigue, shortness of breath   Patient Profile:   Pamela Yang is a 72 y.o. 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  History of Present Illness:   Pamela Yang was noted to be in junctional rhythm in 10/2022 and flecainide was DC'd. She ultimately had a recurrence of AFlutter and underwent RF ablation with Dr. Nelly Laurence in 02/2023.  She was seen in the ED with atrial fibrillation 08/24/23. DCCV was planned but she converted to NSR before DCCV was done. She was seen in the ED again on 9/12 with Supraventricular Tachycardia HR in the 200s. She converted spontaneously to NSR. She called the answering service today with significant fatigue and shortness of breath. She noted elevated HRs with must minimal activity. She was not having symptoms of chest pain or syncope. She was mostly weak in legs and fatigued. She also complained of SOB. She has had 5 episodes of this today that lasted minutes. She was sent to the ED at [facility]. Her current EKG shows what looks like a very slow AFL low 100s. Says she is asymptomatic at this time even though her tele shows similar rhythm.    ED Data: K 3.9, SCr 1.02, ALT 31, hsT 11>>10, Hg 14.2 CXR: No acute dz EKG: Slow AFL with 2:1 conduction HR 100s   Past Medical History:  Diagnosis Date   Atrial fibrillation (HCC)    Dysrhythmia    GAD (generalized anxiety disorder)    Gastroparesis 04/2007   Dr. Cira Servant   GERD (gastroesophageal reflux disease)    Hearing loss    Osteoporosis     Past Surgical  History:  Procedure Laterality Date   A-FLUTTER ABLATION N/A 03/15/2023   Procedure: A-FLUTTER ABLATION;  Surgeon: Mealor, Roberts Gaudy, MD;  Location: [facility] INVASIVE CV LAB;  Service: Cardiovascular;  Laterality: N/A;   COLONOSCOPY  05/20/2011   Dr Karilyn Cota   COLONOSCOPY N/A 07/08/2021   Procedure: COLONOSCOPY;  Surgeon: Malissa Hippo, MD;  Location: [facility] ENDO SUITE;  Service: Endoscopy;  Laterality: N/A;  10:55   TONSILLECTOMY       Medications Prior to Admission: Prior to Admission medications   Medication Sig Start Date End Date Taking? Authorizing Provider  Ascorbic Acid (VITAMIN C PO) Take 500 mg by mouth in the morning and at bedtime.   Yes [provider]  denosumab (PROLIA) 60 MG/ML SOSY injection Inject 60 mg into the skin every 6 (six) months.   Yes [provider]  Flaxseed, Linseed, (FLAXSEED OIL) 1200 MG CAPS Take 1,200 mg by mouth 2 (two) times daily.   Yes [provider]  metoprolol tartrate (LOPRESSOR) 25 MG tablet Take 1 tablet (25 mg total) by mouth 2 (two) times daily as needed (Palpitations and elevated heart rate). 02/06/23  Yes Sabas Sous, MD  Multiple Vitamins-Minerals (MULTIVITAMIN WITH MINERALS) tablet Take 1 tablet by mouth daily.   Yes [provider]  potassium chloride SA (KLOR-CON M) 20 MEQ tablet Take 2 tablets today and 2 tablets tomorrow and then stop Patient taking differently:  Take 20 mEq by mouth daily. Take 2 tablets today and 2 tablets tomorrow and then stop 09/01/23  Yes Branch, Dorothe Pea, MD  Propylene Glycol (SYSTANE BALANCE) 0.6 % SOLN Place 2 drops into both eyes in the morning and at bedtime. Into both eyes   Yes [provider]  sertraline (ZOLOFT) 50 MG tablet 1 by mouth PO QD Patient taking differently: Take 50 mg by mouth daily. 01/27/23  Yes Hoskins, Presley Raddle, NP  XARELTO 15 MG TABS tablet TAKE ONE (1) TABLET BY MOUTH EVERY DAY Patient taking differently: Take 15 mg by mouth daily. 05/22/23  Yes Branch,  Dorothe Pea, MD     Allergies:   No Known Allergies  Social History:   Social History   Socioeconomic History   Marital status: Widowed    Spouse name: Not on file   Number of children: 2   Years of education: Not on file   Highest education level: Not on file  Occupational History   Not on file  Tobacco Use   Smoking status: Former    Current packs/day: 0.00    Types: Cigarettes    Quit date: 12/20/1979    Years since quitting: 43.7   Smokeless tobacco: Never  Vaping Use   Vaping status: Never Used  Substance and Sexual Activity   Alcohol use: Yes    Alcohol/week: 7.0 standard drinks of alcohol    Types: 7 Glasses of wine per week    Comment: occ   Drug use: Never   Sexual activity: Yes    Birth control/protection: Post-menopausal  Other Topics Concern   Not on file  Social History Narrative   1 daughter living. 1 daughter died at age 54.   No grandchildren.   Husband passed 06/2021. Married x 24 years.   Social Determinants of Health   Financial Resource Strain: Low Risk  (12/30/2022)   Overall Financial Resource Strain (CARDIA)    Difficulty of Paying Living Expenses: Not hard at all  Food Insecurity: No Food Insecurity (12/30/2022)   Hunger Vital Sign    Worried About Running Out of Food in the Last Year: Never true    Ran Out of Food in the Last Year: Never true  Transportation Needs: No Transportation Needs (12/30/2022)   PRAPARE - Administrator, Civil Service (Medical): No    Lack of Transportation (Non-Medical): No  Physical Activity: Sufficiently Active (12/30/2022)   Exercise Vital Sign    Days of Exercise per Week: 7 days    Minutes of Exercise per Session: 150+ min  Stress: No Stress Concern Present (12/30/2022)   [employer] of Occupational Health - Occupational Stress Questionnaire    Feeling of Stress : Only a little  Social Connections: Moderately Integrated (12/30/2022)   Social Connection and Isolation Panel [NHANES]    Frequency  of Communication with Friends and Family: More than three times a week    Frequency of Social Gatherings with Friends and Family: Three times a week    Attends Religious Services: More than 4 times per year    Active Member of Clubs or Organizations: Yes    Attends Banker Meetings: More than 4 times per year    Marital Status: Widowed  Intimate Partner Violence: Not At Risk (12/30/2022)   Humiliation, Afraid, Rape, and Kick questionnaire    Fear of Current or Ex-Partner: No    Emotionally Abused: No    Physically Abused: No    Sexually Abused: No  Family History:   The patient's family history includes Coronary artery disease in her brother; Heart attack in her mother; Hypertension in her sister and sister; Osteoporosis in her sister; Ovarian cancer in her sister.    ROS:  Please see the history of present illness.  All other ROS reviewed and negative.     Physical Exam/Data:   Vitals:   09/02/23 1328 09/02/23 1330 09/02/23 1415 09/02/23 1445  BP:  110/87  120/88  Pulse:  94 76 84  Resp:  (!) 21 14 14   Temp: 97.6 F (36.4 C)     TempSrc: Oral     SpO2:  100% 99% 98%  Weight:      Height:       No intake or output data in the 24 hours ending 09/02/23 1516    09/02/2023   11:47 AM 08/31/2023    7:32 PM 08/24/2023    7:50 PM  Last 3 Weights  Weight (lbs) 105 lb 105 lb 106 lb  Weight (kg) 47.628 kg 47.628 kg 48.081 kg     Body mass index is 18.02 kg/m.  General:  Mal-nourished, well developed, in no acute distress HEENT: normal Neck: no JVD Vascular: No carotid bruits; Distal pulses 2+ bilaterally   Cardiac:  normal S1, S2; regular, tachycardia; systolic murmur  Lungs:  clear to auscultation bilaterally, no wheezing, rhonchi or rales  Abd: soft, nontender, no hepatomegaly  Ext: no edema Musculoskeletal:  No deformities, BUE and BLE strength normal and equal Skin: warm and dry  Neuro:  CNs 2-12 intact, no focal abnormalities noted Psych:  Normal affect     EKG:  The ECG that was done 09/02/2023 was personally reviewed and demonstrates See HPI  Relevant CV Studies: reviewed  Laboratory Data:  High Sensitivity Troponin:   Recent Labs  Lab 08/31/23 1945 08/31/23 2153 09/02/23 1149 09/02/23 1337  TROPONINIHS 6 13 11 10       Chemistry Recent Labs  Lab 08/31/23 1945 09/02/23 1149  NA 137 136  K 3.6 3.9  CL 101 103  CO2 22 21*  GLUCOSE 131* 188*  BUN 27* 19  CREATININE 0.92 1.02*  CALCIUM 10.2 9.4  MG  --  2.2  GFRNONAA >60 58*  ANIONGAP 14 12    Recent Labs  Lab 08/31/23 1945 09/02/23 1149  PROT 7.7 7.6  ALBUMIN 4.4 4.5  AST 44* 36  ALT 30 31  ALKPHOS 69 64  BILITOT 0.6 0.9   Lipids No results for input(s): "CHOL", "TRIG", "HDL", "LABVLDL", "LDLCALC", "CHOLHDL" in the last 168 hours. Hematology Recent Labs  Lab 08/31/23 1945 09/02/23 1149  WBC 8.8 8.1  RBC 4.77 5.18*  HGB 13.3 14.2  HCT 40.9 45.0  MCV 85.7 86.9  MCH 27.9 27.4  MCHC 32.5 31.6  RDW 14.2 14.3  PLT 208 207   Thyroid No results for input(s): "TSH", "FREET4" in the last 168 hours. BNPNo results for input(s): "BNP", "PROBNP" in the last 168 hours.  DDimer No results for input(s): "DDIMER" in the last 168 hours.   Radiology/Studies:  DG Chest Port 1 View  Result Date: 09/02/2023 CLINICAL DATA:  Chest pain and tachycardia EXAM: PORTABLE CHEST 1 VIEW COMPARISON:  2 days ago FINDINGS: Normal heart size and mediastinal contours. No acute infiltrate or edema. Chronic biapical pleural based thickening. No convincing nodule when compared with recent priors that were better penetrated. Bilateral nipple shadow. No effusion or pneumothorax. No acute osseous findings. IMPRESSION: Stable exam.  No evidence of acute  disease. Electronically Signed   By: Tiburcio Pea M.D.   On: 09/02/2023 12:22     Assessment and Plan:   1. Recurrent AFL (p waves notable in aVL and march out) 2:1 conduction, symptomatic  S/p AFlutter ablation in 02/2023. Flecainide  stopped in 10/2022 due to junctional brady. She has had multiple ED visits in the last 2 weeks. Once with AFL. She converted before DCCV could be done. 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 She spontaneously converted. She has been quite symptomatic the last couple of days with fatigue and shortness of breath. She feels much worse with any type of activity which is associated with a rapid HR.  - Intolerant to flec given bradycardia - suspect the same will happen with increased metoprolol - Would avoid 1C without and AVN agent  - This make medical management of her atrial arrhythmias difficult as suspect class III will be similar to 1C - Will defer to our EP colleagues if she is a candidate for repeat ablation - Right now seems to be in an atrial arrhythmia but rates acceptable so will monitor overnight.  - Will continue her xeralto     Risk Assessment/Risk Scores:   CHA2DS2-VASc Score = 2   Code Status: Full Code  Severity of Illness: The appropriate patient status for this patient is INPATIENT. Inpatient status is judged to be reasonable and necessary in order to provide the required intensity of service to ensure the patient's safety. The patient's presenting symptoms, physical exam findings, and initial radiographic and laboratory data in the context of their chronic comorbidities is felt to place them at high risk for further clinical deterioration. Furthermore, it is not anticipated that the patient will be medically stable for discharge from the hospital within 2 midnights of admission.   I certify that at the point of admission it is my clinical judgment that the patient will require inpatient hospital care spanning beyond 2 midnights from the point of admission due to high intensity of service, high risk for further deterioration and high frequency of surveillance required.*   For questions or updates, please contact  [facility] Please consult [email or web address] for  contact info under     Signed, Tereso Newcomer, PA-C  09/02/2023 3:16 PM

## 2023-09-02 NOTE — ED Notes (Signed)
HR 196 during triage

## 2023-09-02 NOTE — ED Provider Notes (Signed)
[place] EMERGENCY DEPARTMENT AT [facility] Provider Note   CSN: [id number] Arrival date & time: 09/02/23  1139     History  Chief Complaint  Patient presents with   Tachycardia    Pamela Yang is a 72 y.o. female.  He has a history of A-fib flutter, is status post ablation.  Currently on Xarelto metoprolol.  She was here last week for an episode of A-fib that converted prior to cardioversion.  She was here 2 days ago with a wide-complex tachycardia thought to be probable SVT.  That converted spontaneously also.  Today when she woke up she felt her heart was racing and it was causing her dizziness lightheadedness.  It was on and off.  She said she would be better if she lied down and put an ice pack on her chest but every time she stood back up her symptoms recurred.  She talked to her cardiology office who recommended she come here for further evaluation.  She said she has been eating and drinking well and has been taking her anticoagulation.  The history is provided by the patient.  Palpitations Palpitations quality:  Fast Onset quality:  Sudden Duration:  4 hours Timing:  Intermittent Progression:  Unchanged Chronicity:  Recurrent Relieved by:  Bed rest Associated symptoms: dizziness, near-syncope and shortness of breath   Associated symptoms: no chest pain, no nausea, no syncope and no vomiting        Home Medications Prior to Admission medications   Medication Sig Start Date End Date Taking? Authorizing Provider  potassium chloride SA (KLOR-CON M) 20 MEQ tablet Take 2 tablets today and 2 tablets tomorrow and then stop 09/01/23   Antoine Poche, MD  Ascorbic Acid (VITAMIN C PO) Take 500 mg by mouth in the morning and at bedtime.    [provider]  denosumab (PROLIA) 60 MG/ML SOSY injection Inject 60 mg into the skin every 6 (six) months.    [provider]  Flaxseed, Linseed, (FLAXSEED OIL) 1200 MG CAPS Take 1,200 mg by mouth 2 (two)  times daily.    [provider]  metoprolol tartrate (LOPRESSOR) 25 MG tablet Take 1 tablet (25 mg total) by mouth 2 (two) times daily as needed (Palpitations and elevated heart rate). 02/06/23   Sabas Sous, MD  Multiple Vitamins-Minerals (MULTIVITAMIN WITH MINERALS) tablet Take 1 tablet by mouth daily.    [provider]  Propylene Glycol (SYSTANE BALANCE) 0.6 % SOLN Place 2 drops into both eyes in the morning and at bedtime. Into both eyes    [provider]  sertraline (ZOLOFT) 50 MG tablet 1 by mouth PO QD 01/27/23   Campbell Riches, NP  XARELTO 15 MG TABS tablet TAKE ONE (1) TABLET BY MOUTH EVERY DAY 05/22/23   Antoine Poche, MD      Allergies    Patient has no known allergies.    Review of Systems   Review of Systems  Constitutional:  Negative for fever.  Respiratory:  Positive for shortness of breath.   Cardiovascular:  Positive for palpitations and near-syncope. Negative for chest pain and syncope.  Gastrointestinal:  Negative for nausea and vomiting.  Neurological:  Positive for dizziness.    Physical Exam Updated Vital Signs BP (!) 140/95   Pulse (!) 102   Resp 15   Ht 5\' 4"  (1.626 m)   Wt 47.6 kg   SpO2 98%   BMI 18.02 kg/m  Physical Exam Vitals and nursing  note reviewed.  Constitutional:      General: She is not in acute distress.    Appearance: Normal appearance. She is well-developed.  HENT:     Head: Normocephalic and atraumatic.  Eyes:     Conjunctiva/sclera: Conjunctivae normal.  Cardiovascular:     Rate and Rhythm: Regular rhythm. Tachycardia present.     Heart sounds: No murmur heard. Pulmonary:     Effort: Pulmonary effort is normal. No respiratory distress.     Breath sounds: Normal breath sounds.  Abdominal:     Palpations: Abdomen is soft.     Tenderness: There is no abdominal tenderness. There is no guarding or rebound.  Musculoskeletal:        General: No swelling.     Cervical back: Neck supple.  Skin:     General: Skin is warm and dry.     Capillary Refill: Capillary refill takes less than 2 seconds.  Neurological:     General: No focal deficit present.     Mental Status: She is alert.     ED Results / Procedures / Treatments   Labs (all labs ordered are listed, but only abnormal results are displayed) Labs Reviewed  CBC WITH DIFFERENTIAL/PLATELET - Abnormal; Notable for the following components:      Result Value   RBC 5.18 (*)    All other components within normal limits  COMPREHENSIVE METABOLIC PANEL - Abnormal; Notable for the following components:   CO2 21 (*)    Glucose, Bld 188 (*)    Creatinine, Ser 1.02 (*)    GFR, Estimated 58 (*)    All other components within normal limits  APTT  MAGNESIUM  TROPONIN I (HIGH SENSITIVITY)  TROPONIN I (HIGH SENSITIVITY)    EKG EKG Interpretation Date/Time:  Saturday September 02 2023 11:50:05 EDT Ventricular Rate:  102 PR Interval:  125 QRS Duration:  110 QT Interval:  349 QTC Calculation: 457 R Axis:   -80  Text Interpretation: Sinus tachycardia vs junctional LAD, consider left anterior fascicular block Probable anterior infarct, age indeterminate Confirmed by Meridee Score [phone]) on 09/02/2023 11:57:48 AM  Radiology DG Chest Port 1 View  Result Date: 08/31/2023 CLINICAL DATA:  Shortness of breath EXAM: PORTABLE CHEST 1 VIEW COMPARISON:  Chest x-ray 08/24/2023 FINDINGS: The lungs are hyperinflated, unchanged. The heart size and mediastinal contours are within normal limits. Both lungs are clear. The visualized skeletal structures are unremarkable. IMPRESSION: No active disease. The lungs are hyperinflated, unchanged. Electronically Signed   By: Darliss Cheney M.D.   On: 08/31/2023 21:32    Procedures Procedures    Medications Ordered in ED Medications - No data to display  ED Course/ Medical Decision Making/ A&P Clinical Course as of 09/02/23 1801  Sat Sep 02, 2023  1227 Chest x-ray interpreted by me as no acute  infiltrate no pneumothorax.  Awaiting radiology reading [MB]  1310 Discussed with cardiology Dr. Servando Salina at [facility].  She is excepting the patient for admission to her service telemetry [MB]    Clinical Course User Index [MB] Terrilee Files, MD                                 Medical Decision Making Amount and/or Complexity of Data Reviewed Labs: ordered. Radiology: ordered. ECG/medicine tests: ordered.  Risk Decision regarding hospitalization.   This patient complains of rapid heart rate lightheadedness dizziness presyncope; this involves an extensive number of treatment  Options and is a complaint that carries with it a high risk of complications and morbidity. The differential includes arrhythmia, dehydration, anemia, metabolic derangement  I ordered, reviewed and interpreted labs, which included CBC with normal hemoglobin normal white count, chemistries with mildly low bicarb elevated glucose, normal troponin normal magnesium I ordered imaging studies which included chest x-ray and I independently    visualized and interpreted imaging which showed no acute findings Previous records obtained and reviewed cardiology and recent 2 ED visits I consulted cardiology Dr. Servando Salina and discussed lab and imaging findings and discussed disposition.  Cardiac monitoring reviewed, junctional tachycardia Social determinants considered, no significant barriers Critical Interventions: None  After the interventions stated above, I reevaluated the patient and found patient to be feeling pretty well at rest Admission and further testing considered, she would benefit from admission to cardiology at [facility] for further management of her arrhythmia.  Patient in agreement we will plan for admission.         Final Clinical Impression(s) / ED Diagnoses Final diagnoses:  Junctional tachycardia  Near syncope    Rx / DC Orders ED Discharge Orders     None         Terrilee Files,  MD 09/02/23 [phone]

## 2023-09-03 ENCOUNTER — Other Ambulatory Visit (HOSPITAL_COMMUNITY): Payer: Medicare Other

## 2023-09-03 DIAGNOSIS — I4719 Other supraventricular tachycardia: Secondary | ICD-10-CM

## 2023-09-03 LAB — BASIC METABOLIC PANEL
Anion gap: 10 (ref 5–15)
Anion gap: 12 (ref 5–15)
BUN: 14 mg/dL (ref 8–23)
BUN: 19 mg/dL (ref 8–23)
CO2: 24 mmol/L (ref 22–32)
CO2: 24 mmol/L (ref 22–32)
Calcium: 9.3 mg/dL (ref 8.9–10.3)
Calcium: 9.6 mg/dL (ref 8.9–10.3)
Chloride: 104 mmol/L (ref 98–111)
Chloride: 100 mmol/L (ref 98–111)
Creatinine, Ser: 0.77 mg/dL (ref 0.44–1.00)
Creatinine, Ser: 0.89 mg/dL (ref 0.44–1.00)
GFR, Estimated: >60 mL/min (ref >60)
GFR, Estimated: >60 mL/min (ref >60)
Glucose, Bld: 90 mg/dL (ref 70–99)
Glucose, Bld: 141 mg/dL — ABNORMAL HIGH (ref 70–99)
Potassium: 3.6 mmol/L (ref 3.5–5.1)
Potassium: 4.6 mmol/L (ref 3.5–5.1)
Sodium: 138 mmol/L (ref 135–145)
Sodium: 136 mmol/L (ref 135–145)

## 2023-09-03 LAB — CBC
HCT: 39.2 % (ref 36.0–46.0)
Hemoglobin: 12.5 g/dL (ref 12.0–15.0)
MCH: 26.9 pg (ref 26.0–34.0)
MCHC: 31.9 g/dL (ref 30.0–36.0)
MCV: 84.3 fL (ref 80.0–100.0)
Platelets: 163 10*3/uL (ref 150–400)
RBC: 4.65 MIL/uL (ref 3.87–5.11)
RDW: 13.9 % (ref 11.5–15.5)
WBC: 4.9 10*3/uL (ref 4.0–10.5)
nRBC: 0 % (ref 0.0–0.2)

## 2023-09-03 LAB — MAGNESIUM: Magnesium: 2 mg/dL (ref 1.7–2.4)

## 2023-09-03 MED ORDER — SODIUM CHLORIDE 0.9% FLUSH
3.0000 mL | Freq: Two times a day (BID) | INTRAVENOUS | Status: DC
  Administered 2023-09-03 – 2023-09-05 (×6): 3 mL via INTRAVENOUS

## 2023-09-03 MED ORDER — POTASSIUM CHLORIDE CRYS ER 10 MEQ PO TBCR
10.0000 meq | EXTENDED_RELEASE_TABLET | Freq: Every day | ORAL | Status: DC
  Administered 2023-09-04 – 2023-09-06 (×3): 10 meq via ORAL
  Filled 2023-09-03 (×3): qty 1

## 2023-09-03 MED ORDER — SODIUM CHLORIDE 0.9 % IV SOLN: 250.0000 mL | INTRAVENOUS | Status: DC | PRN

## 2023-09-03 MED ORDER — POTASSIUM CHLORIDE CRYS ER 20 MEQ PO TBCR
60.0000 meq | EXTENDED_RELEASE_TABLET | Freq: Once | ORAL | Status: AC
  Administered 2023-09-03: 60 meq via ORAL
  Filled 2023-09-03: qty 3

## 2023-09-03 MED ORDER — DOFETILIDE 250 MCG PO CAPS
250.0000 ug | ORAL_CAPSULE | Freq: Two times a day (BID) | ORAL | Status: DC
  Administered 2023-09-03 – 2023-09-06 (×6): 250 ug via ORAL
  Filled 2023-09-03 (×8): qty 1

## 2023-09-03 MED ORDER — MAGNESIUM SULFATE 2 GM/50ML IV SOLN
2.0000 g | Freq: Once | INTRAVENOUS | Status: AC
  Administered 2023-09-03: 2 g via INTRAVENOUS
  Filled 2023-09-03: qty 50

## 2023-09-03 MED ORDER — METOPROLOL TARTRATE 5 MG/5ML IV SOLN: 2.5000 mg | INTRAVENOUS | Status: AC

## 2023-09-03 MED ORDER — MAGNESIUM SULFATE 2 GM/50ML IV SOLN: 2.0000 g | Freq: Once | INTRAVENOUS | Status: DC

## 2023-09-03 MED ORDER — SODIUM CHLORIDE 0.9% FLUSH: 3.0000 mL | INTRAVENOUS | Status: DC | PRN

## 2023-09-03 MED ORDER — RIVAROXABAN 15 MG PO TABS
15.0000 mg | ORAL_TABLET | Freq: Every day | ORAL | Status: DC
  Administered 2023-09-03 – 2023-09-05 (×3): 15 mg via ORAL
  Filled 2023-09-03 (×4): qty 1

## 2023-09-03 MED ORDER — METOPROLOL TARTRATE 5 MG/5ML IV SOLN
2.5000 mg | Freq: Four times a day (QID) | INTRAVENOUS | Status: DC | PRN
  Filled 2023-09-03: qty 5

## 2023-09-03 MED ORDER — DOFETILIDE 250 MCG PO CAPS: 250.0000 ug | ORAL_CAPSULE | Freq: Two times a day (BID) | ORAL | Status: DC

## 2023-09-03 MED ORDER — POTASSIUM CHLORIDE CRYS ER 20 MEQ PO TBCR: 20.0000 meq | EXTENDED_RELEASE_TABLET | ORAL | Status: DC

## 2023-09-03 NOTE — Plan of Care (Signed)
  Problem: Education: Goal: Understanding of disease, treatment, and recovery process will improve Outcome: Progressing   Problem: Activity: Goal: Ability to return to baseline activity level will improve Outcome: Progressing   Problem: Cardiac: Goal: Ability to maintain adequate cardiovascular perfusion will improve Outcome: Progressing Goal: Vascular access site(s) Level 0-1 will be maintained Outcome: Progressing   Problem: Health Behavior/ Discharge Planning: Goal: Ability to safely manage health related needs after discharge Outcome: Progressing   Problem: Education: Goal: Knowledge of General Education information will improve Description: Including pain rating scale, medication(s)/side effects and non-pharmacologic comfort measures Outcome: Progressing   Problem: Health Behavior/Discharge Planning: Goal: Ability to manage health-related needs will improve Outcome: Progressing   Problem: Clinical Measurements: Goal: Ability to maintain clinical measurements within normal limits will improve Outcome: Progressing Goal: Will remain free from infection Outcome: Progressing Goal: Diagnostic test results will improve Outcome: Progressing Goal: Respiratory complications will improve Outcome: Progressing Goal: Cardiovascular complication will be avoided Outcome: Progressing   Problem: Activity: Goal: Risk for activity intolerance will decrease Outcome: Progressing   Problem: Nutrition: Goal: Adequate nutrition will be maintained Outcome: Progressing   Problem: Coping: Goal: Level of anxiety will decrease Outcome: Progressing   Problem: Elimination: Goal: Will not experience complications related to bowel motility Outcome: Progressing Goal: Will not experience complications related to urinary retention Outcome: Progressing   Problem: Pain Managment: Goal: General experience of comfort will improve Outcome: Progressing   Problem: Safety: Goal: Ability to  remain free from injury will improve Outcome: Progressing   Problem: Skin Integrity: Goal: Risk for impaired skin integrity will decrease Outcome: Progressing

## 2023-09-03 NOTE — Progress Notes (Signed)
QTC 426 See EKG in epic

## 2023-09-03 NOTE — Progress Notes (Signed)
Pt was eating breakfast this morning in the bed. Noticed HR sustained in the 145s. C/p of SOB and palpitations. Then HR elevated in the 220s that lasted about 5 min. Asked pt to bear down to help slow down the HR. Metoprolol PRN given.  EKG performed. Rapid response called. Cardiology team notified.  See new orders, will continue to monitor pt.

## 2023-09-03 NOTE — Progress Notes (Signed)
Pharmacy: Dofetilide (Tikosyn) - Initial Consult Assessment and Electrolyte Replacement  Pharmacy consulted to assist in monitoring and replacing electrolytes in this 72 y.o. female admitted on 09/02/2023 undergoing dofetilide initiation. First dofetilide dose: 9/15@2000 .  Assessment:  Patient Exclusion Criteria: If any screening criteria checked as "Yes", then  patient  should NOT receive dofetilide until criteria item is corrected.  If "Yes" please indicate correction plan.  YES  NO Patient  Exclusion Criteria Correction Plan   []   [x]   Baseline QTc interval is greater than or equal to 440 msec. IF above YES box checked dofetilide contraindicated unless patient has ICD; then may proceed if QTc 500-550 msec or with known ventricular conduction abnormalities may proceed with QTc 550-600 msec. QTc = 398     []   [x]   Patient is known or suspected to have a digoxin level greater than 2 ng/ml: No results found for: "DIGOXIN"     []   [x]   Creatinine clearance less than 20 ml/min (calculated using Cockcroft-Gault, actual body weight and serum creatinine): Estimated Creatinine Clearance: 46.4 mL/min (by C-G formula based on SCr of 0.77 mg/dL).     [x]   []  Patient has received drugs known to prolong the QT intervals within the last 48 hours (phenothiazines, tricyclics or tetracyclic antidepressants, erythromycin, H-1 antihistamines, cisapride, fluoroquinolones, azithromycin, ondansetron).   Updated information on QT prolonging agents is available to be searched on the following database:QT prolonging agents  Sertraline has conditional risk for Qtc prolongation but one of lower risk options in drug class - will monitor Qtc.  Confirmed with patient no recent H1 antihistamine use.  Ondansetron prn ordered but never received - order removed.   []   [x]   Patient received a dose of hydrochlorothiazide (Oretic) alone or in any combination including triamterene (Dyazide, Maxzide) in the last  48 hours.    []   [x]  Patient received a medication known to increase dofetilide plasma concentrations prior to initial dofetilide dose:  Trimethoprim (Primsol, Proloprim) in the last 36 hours Verapamil (Calan, Verelan) in the last 36 hours or a sustained release dose in the last 72 hours Megestrol (Megace) in the last 5 days  Cimetidine (Tagamet) in the last 6 hours Ketoconazole (Nizoral) in the last 24 hours Itraconazole (Sporanox) in the last 48 hours  Prochlorperazine (Compazine) in the last 36 hours     []   [x]   Patient is known to have a history of torsades de pointes; congenital or acquired long QT syndromes.    []   [x]   Patient has received a Class 1 antiarrhythmic with less than 2 half-lives since last dose. (Disopyramide, Quinidine, Procainamide, Lidocaine, Mexiletine, Flecainide, Propafenone) Was on flecainide in past but confirmed stopped in 10/2022 due to junctional brady.   []   [x]   Patient has received amiodarone therapy in the past 3 months or amiodarone level is greater than 0.3 ng/ml.    Labs:    Component Value Date/Time   K 3.6 09/03/2023 0354   MG 2.2 09/02/2023 1149     Plan: Select One Calculated CrCl  Dose q12h  []  > 60 ml/min 500 mcg  [x]  40-60 ml/min - calculates at 47 mL/min 250 mcg  []  20-40 ml/min 125 mcg   [x]   Physician selected initial dose within range recommended for patients level of renal function - will monitor for response.  []   Physician selected initial dose outside of range recommended for patients level of renal function - will discuss if the dose should be altered at this time.  Patient has been appropriately anticoagulated with Xarelto - confirmed with patient no missed doses in last 3 weeks.  Potassium: K 3.5-3.7:  Hold Tikosyn initiation and give KCl 60 mEq po x1 and repeat BMET 2hr after dose - repeat appropriate dose if K < 4    Magnesium: Mg 1.8-2: Give Mg 2 gm IV x1 to prevent Mg from dropping below 1.8 - do not need to  recheck Mg. Appropriate to initiate Tikosyn   Thank you for allowing pharmacy to participate in this patient's care,  Sherron Monday, PharmD, BCCCP Clinical Pharmacist  Phone: [phone] 09/03/2023 11:27 AM  Please check AMION for all [facility] Pharmacy phone numbers After 10:00 PM, call Main Pharmacy [phone]

## 2023-09-03 NOTE — Consult Note (Addendum)
ELECTROPHYSIOLOGY CONSULT NOTE  Patient ID: Pamela Yang, MRN: [id number], DOB/AGE: 03/20/1951 72 y.o. Admit date: 09/02/2023 Date of Consult: 09/03/2023  Primary Physician: Babs Sciara, MD Primary Cardiologist: Pamela Yang is a 72 y.o. female who is being seen today for the evaluation of atrial fib at the request of Cardiolog.   Chief Complaint: atrial fib  a HPI Pamela Yang is a 72 y.o. female admitted with intermittent tachypalps assoc with SOB    9/5 >> ER atrial fib((left side of ECG looks like pwave morphology distinct from middle and right, and cardioverted to sinus bradycardia>> flutter( or fib) CL about 200 msec 9/12 recurrent SVT spontaneous reversion (CL 300 msec (See Below) )   EMS note>> ECG  atypical flutter (flutter cycle length 300 msec (As above)    Afib dx 6/21  Rx with  flecainide.  6/23 developed atrial flutter and then junctional rhythm.  Flec discontinued w Bradycardia and underwent flutter ablation (GM)  Patient has been largely asymptomatic March until about 10 days ago.  No interval illnesses no new medication changes Date Cr K Hgb TSH  9/24 0.77 3.6 12.5 1.3           Thromboembolic risk factors ( age -63 , Gender-1) for a CHADSVASc Score of >=2    Past Medical History:  Diagnosis Date   Atrial fibrillation/flutter    s/p RF ablation for AFlutter 02/2023   GAD (generalized anxiety disorder)    Gastroparesis 04/2007   Dr. Cira Servant   GERD (gastroesophageal reflux disease)    Hearing loss    Junctional rhythm    Osteoporosis    SVT (supraventricular tachycardia) 08/31/2023       Surgical History:  Past Surgical History:  Procedure Laterality Date   A-FLUTTER ABLATION N/A 03/15/2023   Procedure: A-FLUTTER ABLATION;  Surgeon: Maurice Small, MD;  Location: [facility] INVASIVE CV LAB;  Service: Cardiovascular;  Laterality: N/A;   COLONOSCOPY  05/20/2011   Dr Karilyn Cota   COLONOSCOPY N/A 07/08/2021   Procedure: COLONOSCOPY;  Surgeon:  Malissa Hippo, MD;  Location: [facility];  Service: Endoscopy;  Laterality: N/A;  10:55   TONSILLECTOMY       Home Meds: Prior to Admission medications   Medication Sig Start Date End Date Taking? Authorizing Provider  Ascorbic Acid (VITAMIN C PO) Take 500 mg by mouth in the morning and at bedtime.   Yes [provider]  denosumab (PROLIA) 60 MG/ML SOSY injection Inject 60 mg into the skin every 6 (six) months.   Yes [provider]  Flaxseed, Linseed, (FLAXSEED OIL) 1200 MG CAPS Take 1,200 mg by mouth 2 (two) times daily.   Yes [provider]  metoprolol tartrate (LOPRESSOR) 25 MG tablet Take 1 tablet (25 mg total) by mouth 2 (two) times daily as needed (Palpitations and elevated heart rate). 02/06/23  Yes Sabas Sous, MD  Multiple Vitamins-Minerals (MULTIVITAMIN WITH MINERALS) tablet Take 1 tablet by mouth daily.   Yes [provider]  potassium chloride SA (KLOR-CON M) 20 MEQ tablet Take 2 tablets today and 2 tablets tomorrow and then stop Patient taking differently: Take 20 mEq by mouth daily. Take 2 tablets today and 2 tablets tomorrow and then stop 09/01/23  Yes Branch, Dorothe Pea, MD  Propylene Glycol (SYSTANE BALANCE) 0.6 % SOLN Place 2 drops into both eyes in the morning and at bedtime. Into both eyes   Yes [provider]  sertraline (ZOLOFT) 50  MG tablet 1 by mouth PO QD Patient taking differently: Take 50 mg by mouth daily. 01/27/23  Yes Hoskins, Presley Raddle, NP  XARELTO 15 MG TABS tablet TAKE ONE (1) TABLET BY MOUTH EVERY DAY Patient taking differently: Take 15 mg by mouth daily. 05/22/23  Yes Antoine Poche, MD    Inpatient Medications:   metoprolol tartrate  2.5 mg Intravenous NOW   Rivaroxaban  15 mg Oral Q supper   sertraline  50 mg Oral Daily   sodium chloride flush  3 mL Intravenous Q12H     Allergies: No Known Allergies  Social History   Socioeconomic History   Marital status: Widowed    Spouse name: Not on file    Number of children: 2   Years of education: Not on file   Highest education level: Not on file  Occupational History   Not on file  Tobacco Use   Smoking status: Former    Current packs/day: 0.00    Types: Cigarettes    Quit date: 12/20/1979    Years since quitting: 43.7   Smokeless tobacco: Never  Vaping Use   Vaping status: Never Used  Substance and Sexual Activity   Alcohol use: Yes    Alcohol/week: 7.0 standard drinks of alcohol    Types: 7 Glasses of wine per week    Comment: occ   Drug use: Never   Sexual activity: Yes    Birth control/protection: Post-menopausal  Other Topics Concern   Not on file  Social History Narrative   1 daughter living. 1 daughter died at age 45.   No grandchildren.   Husband passed 06/2021. Married x 24 years.   Social Determinants of Health   Financial Resource Strain: Low Risk  (12/30/2022)   Overall Financial Resource Strain (CARDIA)    Difficulty of Paying Living Expenses: Not hard at all  Food Insecurity: No Food Insecurity (12/30/2022)   Hunger Vital Sign    Worried About Running Out of Food in the Last Year: Never true    Ran Out of Food in the Last Year: Never true  Transportation Needs: No Transportation Needs (12/30/2022)   PRAPARE - Administrator, Civil Service (Medical): No    Lack of Transportation (Non-Medical): No  Physical Activity: Sufficiently Active (12/30/2022)   Exercise Vital Sign    Days of Exercise per Week: 7 days    Minutes of Exercise per Session: 150+ min  Stress: No Stress Concern Present (12/30/2022)   [employer] of Occupational Health - Occupational Stress Questionnaire    Feeling of Stress : Only a little  Social Connections: Moderately Integrated (12/30/2022)   Social Connection and Isolation Panel [NHANES]    Frequency of Communication with Friends and Family: More than three times a week    Frequency of Social Gatherings with Friends and Family: Three times a week    Attends Religious  Services: More than 4 times per year    Active Member of Clubs or Organizations: Yes    Attends Banker Meetings: More than 4 times per year    Marital Status: Widowed  Intimate Partner Violence: Not At Risk (12/30/2022)   Humiliation, Afraid, Rape, and Kick questionnaire    Fear of Current or Ex-Partner: No    Emotionally Abused: No    Physically Abused: No    Sexually Abused: No     Family History  Problem Relation Age of Onset   Heart attack Mother    Ovarian cancer  Sister    Hypertension Sister    Hypertension Sister    Osteoporosis Sister    Coronary artery disease Brother      ROS:  Please see the history of present illness.     All other systems reviewed and negative.    Physical Exam: Blood pressure 103/86, pulse (!) 109, temperature 97.6 F (36.4 C), temperature source Oral, resp. rate 14, height 5\' 4"  (1.626 m), weight 47.6 kg, SpO2 99%. General: Well developed, well nourished female in no acute distress. Head: Normocephalic, atraumatic, sclera non-icteric, no xanthomas, nares are without discharge. EENT: normal Lymph Nodes:  none Back: without scoliosis/kyphosis, no CVA tendersness Neck: Negative for carotid bruits. JVD 7 cm Lungs: Clear bilaterally to auscultation without wheezes, rales, or rhonchi. Breathing is unlabored. Heart: RRR with S1 S2. No murmur , rubs, or gallops appreciated. Abdomen: Soft, non-tender, non-distended with normoactive bowel sounds. No hepatomegaly. No rebound/guarding. No obvious abdominal masses. Msk:  Strength and tone appear normal for age. Extremities: No clubbing or cyanosis. No edema.  Distal pedal pulses are 2+ and equal bilaterally. Skin: Warm and Dry Neuro: Alert and oriented X 3. CN III-XII intact Grossly normal sensory and motor function . Psych:  Responds to questions appropriately with a normal affect.      Labs: Cardiac Enzymes No results for input(s): "CKTOTAL", "CKMB", "TROPONINI" in the last 72  hours. CBC Lab Results  Component Value Date   WBC 4.9 09/03/2023   HGB 12.5 09/03/2023   HCT 39.2 09/03/2023   MCV 84.3 09/03/2023   PLT 163 09/03/2023   PROTIME: No results for input(s): "LABPROT", "INR" in the last 72 hours. Chemistry  Recent Labs  Lab 09/02/23 1149 09/03/23 0354  NA 136 138  K 3.9 3.6  CL 103 104  CO2 21* 24  BUN 19 14  CREATININE 1.02* 0.77  CALCIUM 9.4 9.3  PROT 7.6  --   BILITOT 0.9  --   ALKPHOS 64  --   ALT 31  --   AST 36  --   GLUCOSE 188* 90   Lipids Lab Results  Component Value Date   CHOL 184 01/24/2023   HDL 69 01/24/2023   LDLCALC 104 (H) 01/24/2023   TRIG 56 01/24/2023   BNP No results found for: "PROBNP" Thyroid Function Tests: Recent Labs    09/02/23 1841  TSH 1.296           EKG: (As above)   Notably ECG 08/24/2023 sinus rhythm had a QT interval of about 400 ms Assessment and Plan:  Atrial flutter with 2: 1 and 1: 1 conduction  Atrial fibrillation  Prior CTI ablation  Sinus bradycardia  Renal insufficiency grade 3 (GFR calculated today 47 with a creatinine of 0.78   Patient had recurrent flurries/storm of atrial arrhythmia flutter as well as fibrillation with rapid rates and significant symptoms.  Her sinus bradycardia makes rate control a preferred strategy.  She did not tolerate 1C therapy previously with sinus node dysfunction, not common, so in the short-term have elected to begin her on dofetilide and have reviewed with her its purpose as well as potential for proarrhythmia. Based on her creatinine clearance the appropriate dose is 250 mcg twice daily.  I think as a intermediate target of therapy, however, she should have another ablation.  Dr. Nelly Laurence will see her again in the morning and they can discuss this further  Will need potassium repletion.  Will give 40 mEq recheck a metabolic profile and anticipate the  initiation of dofetilide this evening.  Magnesium 2.2   Sherryl Manges

## 2023-09-04 ENCOUNTER — Inpatient Hospital Stay (HOSPITAL_COMMUNITY): Payer: Medicare Other

## 2023-09-04 ENCOUNTER — Other Ambulatory Visit (HOSPITAL_COMMUNITY): Payer: Self-pay

## 2023-09-04 DIAGNOSIS — I4891 Unspecified atrial fibrillation: Secondary | ICD-10-CM

## 2023-09-04 DIAGNOSIS — I4892 Unspecified atrial flutter: Secondary | ICD-10-CM | POA: Diagnosis not present

## 2023-09-04 LAB — BASIC METABOLIC PANEL
Anion gap: 7 (ref 5–15)
BUN: 20 mg/dL (ref 8–23)
CO2: 25 mmol/L (ref 22–32)
Calcium: 9 mg/dL (ref 8.9–10.3)
Chloride: 106 mmol/L (ref 98–111)
Creatinine, Ser: 0.83 mg/dL (ref 0.44–1.00)
GFR, Estimated: >60 mL/min (ref >60)
Glucose, Bld: 97 mg/dL (ref 70–99)
Potassium: 4.2 mmol/L (ref 3.5–5.1)
Sodium: 138 mmol/L (ref 135–145)

## 2023-09-04 LAB — ECHOCARDIOGRAM COMPLETE
Area-P 1/2: 3.2 cm2
Height: 64 in
S' Lateral: 2.3 cm
Weight: 1680 [oz_av]

## 2023-09-04 LAB — MAGNESIUM: Magnesium: 2.3 mg/dL (ref 1.7–2.4)

## 2023-09-04 NOTE — TOC Benefit Eligibility Note (Signed)
Patient Product/process development scientist completed.    The patient is insured through [facility]. Patient has [employer], may use a copay card, and/or apply for patient assistance if available.    Ran test claim for dofetilide (Tikosyn) 250 mcdg and the current 30 day co-pay is $200.00.   This test claim was processed through [facility]- copay amounts may vary at other pharmacies due to pharmacy/plan contracts, or as the patient moves through the different stages of their insurance plan.     Roland Earl, CPHT Pharmacy Technician III Certified Patient Advocate [facility] Patient Advocate Team Direct Number: [phone]  Fax: [phone]

## 2023-09-04 NOTE — Progress Notes (Signed)
Rounding Note    Patient Name: Pamela Yang Date of Encounter: 09/04/2023  [facility] Cardiologist: Dina Rich, MD   Subjective   Doing OK, not as symptomatic with better rate control  Inpatient Medications    Scheduled Meds:  dofetilide  250 mcg Oral BID   metoprolol tartrate  2.5 mg Intravenous NOW   potassium chloride  10 mEq Oral Daily   rivaroxaban  15 mg Oral Q supper   sertraline  50 mg Oral Daily   sodium chloride flush  3 mL Intravenous Q12H   sodium chloride flush  3 mL Intravenous Q12H   Continuous Infusions:  sodium chloride     PRN Meds: sodium chloride, acetaminophen **OR** acetaminophen, metoprolol tartrate, metoprolol tartrate, senna-docusate, sodium chloride flush   Vital Signs    Vitals:   09/03/23 2011 09/03/23 2325 09/04/23 0444 09/04/23 0734  BP: 106/86 121/85 102/84   Pulse:  97 100 100  Resp: 18 16 18    Temp: 97.7 F (36.5 C) 97.8 F (36.6 C) 97.6 F (36.4 C) (!) 97.3 F (36.3 C)  TempSrc: Oral Oral Oral Oral  SpO2: 98% 95% 99% 97%  Weight:      Height:        Intake/Output Summary (Last 24 hours) at 09/04/2023 0832 Last data filed at 09/04/2023 0100 Gross per 24 hour  Intake 50 ml  Output 0 ml  Net 50 ml      09/02/2023    4:38 PM 09/02/2023   11:47 AM 08/31/2023    7:32 PM  Last 3 Weights  Weight (lbs) 105 lb 105 lb 105 lb  Weight (kg) 47.628 kg 47.628 kg 47.628 kg      Telemetry    AFlutter 90's-100's - Personally Reviewed  ECG    AFlutter 99, QTc is difficult to assess well, by ECG read - Personally Reviewed with Dr. Nelly Laurence  Physical Exam   GEN: No acute distress.   Neck: No JVD Cardiac: irreg-irreg no murmurs, rubs, or gallops.  Respiratory: CTA b/l. GI: Soft, nontender, non-distended  MS: No edema; No deformity. Neuro:  Nonfocal  Psych: Normal affect   Labs    High Sensitivity Troponin:   Recent Labs  Lab 08/31/23 1945 08/31/23 2153 09/02/23 1149 09/02/23 1337   TROPONINIHS 6 13 11 10      Chemistry Recent Labs  Lab 08/31/23 1945 09/02/23 1149 09/03/23 0354 09/03/23 1737 09/04/23 0216  NA 137 136 138 136 138  K 3.6 3.9 3.6 4.6 4.2  CL 101 103 104 100 106  CO2 22 21* 24 24 25   GLUCOSE 131* 188* 90 141* 97  BUN 27* 19 14 19 20   CREATININE 0.92 1.02* 0.77 0.89 0.83  CALCIUM 10.2 9.4 9.3 9.6 9.0  MG  --  2.2 2.0  --  2.3  PROT 7.7 7.6  --   --   --   ALBUMIN 4.4 4.5  --   --   --   AST 44* 36  --   --   --   ALT 30 31  --   --   --   ALKPHOS 69 64  --   --   --   BILITOT 0.6 0.9  --   --   --   GFRNONAA >60 58* >60 >60 >60  ANIONGAP 14 12 10 12 7     Lipids No results for input(s): "CHOL", "TRIG", "HDL", "LABVLDL", "LDLCALC", "CHOLHDL" in the last 168 hours.  Hematology Recent Labs  Lab 08/31/23 1945 09/02/23 1149 09/03/23 0354  WBC 8.8 8.1 4.9  RBC 4.77 5.18* 4.65  HGB 13.3 14.2 12.5  HCT 40.9 45.0 39.2  MCV 85.7 86.9 84.3  MCH 27.9 27.4 26.9  MCHC 32.5 31.6 31.9  RDW 14.2 14.3 13.9  PLT 208 207 163   Thyroid  Recent Labs  Lab 09/02/23 1841  TSH 1.296    BNPNo results for input(s): "BNP", "PROBNP" in the last 168 hours.  DDimer No results for input(s): "DDIMER" in the last 168 hours.   Radiology    DG Chest Port 1 View  Result Date: 09/02/2023 CLINICAL DATA:  Chest pain and tachycardia EXAM: PORTABLE CHEST 1 VIEW COMPARISON:  2 days ago FINDINGS: Normal heart size and mediastinal contours. No acute infiltrate or edema. Chronic biapical pleural based thickening. No convincing nodule when compared with recent priors that were better penetrated. Bilateral nipple shadow. No effusion or pneumothorax. No acute osseous findings. IMPRESSION: Stable exam.  No evidence of acute disease. Electronically Signed   By: Tiburcio Pea M.D.   On: 09/02/2023 12:22    Cardiac Studies    03/15/23: EPS/ablation CONCLUSIONS:   1. Isthmus-dependent counter clockwise right atrial flutter.   2. Successful radiofrequency ablation of  atrial flutter along the cavotricuspid isthmus with complete bidirectional isthmus block achieved.   3. No inducible arrhythmias following ablation.   4. No early apparent complications.   Patient Profile     72 y.o. female w/PMHx GERD, gastroparesis, AFib (? Unclear), and AFlutter  Hx of flecainide >> stopped with SB and junctional rhythm CTI ablation March 2024  Assessment & Plan   SVT > AFib/AFlutter CHA2DS2Vasc is 2 for age/gender on Xarelto appropriately dosed Echo is pending Tikosyn load is in progress K+  4,2 Mag 2.3 Creat 0.83 QTc appears stable  DCCV tomorrow if not in SR and able to get her on the schedule Informed Consent   Shared Decision Making/Informed Consent The risks (stroke, cardiac arrhythmias rarely resulting in the need for a temporary or permanent pacemaker, skin irritation or burns and complications associated with conscious sedation including aspiration, arrhythmia, respiratory failure and death), benefits (restoration of normal sinus rhythm) and alternatives of a direct current cardioversion were explained in detail to Pamela Yang and she agrees to proceed.           For questions or updates, please contact [facility] Please consult [email or web address] for contact info under        Signed, Sheilah Pigeon, PA-C  09/04/2023, 8:32 AM

## 2023-09-04 NOTE — Plan of Care (Signed)
  Problem: Education: Goal: Understanding of disease, treatment, and recovery process will improve Outcome: Progressing   Problem: Activity: Goal: Ability to return to baseline activity level will improve Outcome: Progressing   Problem: Cardiac: Goal: Ability to maintain adequate cardiovascular perfusion will improve Outcome: Progressing Goal: Vascular access site(s) Level 0-1 will be maintained Outcome: Progressing   Problem: Health Behavior/ Discharge Planning: Goal: Ability to safely manage health related needs after discharge Outcome: Progressing   Problem: Education: Goal: Knowledge of General Education information will improve Description: Including pain rating scale, medication(s)/side effects and non-pharmacologic comfort measures Outcome: Progressing   Problem: Health Behavior/Discharge Planning: Goal: Ability to manage health-related needs will improve Outcome: Progressing   Problem: Clinical Measurements: Goal: Ability to maintain clinical measurements within normal limits will improve Outcome: Progressing Goal: Will remain free from infection Outcome: Progressing Goal: Diagnostic test results will improve Outcome: Progressing Goal: Respiratory complications will improve Outcome: Progressing Goal: Cardiovascular complication will be avoided Outcome: Progressing   Problem: Activity: Goal: Risk for activity intolerance will decrease Outcome: Progressing   Problem: Nutrition: Goal: Adequate nutrition will be maintained Outcome: Progressing   Problem: Coping: Goal: Level of anxiety will decrease Outcome: Progressing   Problem: Elimination: Goal: Will not experience complications related to bowel motility Outcome: Progressing Goal: Will not experience complications related to urinary retention Outcome: Progressing   Problem: Pain Managment: Goal: General experience of comfort will improve Outcome: Progressing   Problem: Safety: Goal: Ability to  remain free from injury will improve Outcome: Progressing   Problem: Skin Integrity: Goal: Risk for impaired skin integrity will decrease Outcome: Progressing

## 2023-09-04 NOTE — Progress Notes (Signed)
Pharmacy: Dofetilide (Tikosyn) - Follow Up Assessment and Electrolyte Replacement  Pharmacy consulted to assist in monitoring and replacing electrolytes in this 72 y.o. female admitted on 09/02/2023 undergoing dofetilide initiation. First dofetilide dose: 09/03/23  Labs:    Component Value Date/Time   K 4.2 09/04/2023 0216   MG 2.3 09/04/2023 0216     Plan: Potassium: K >/= 4: No additional supplementation needed  Magnesium: Mg > 2: No additional supplementation needed   Fredonia Highland, PharmD, BCPS, [facility] Clinical Pharmacist [phone] Please check AMION for all [facility] Pharmacy numbers 09/04/2023

## 2023-09-04 NOTE — Progress Notes (Signed)
Echocardiogram 2D Echocardiogram has been performed.  Warren Lacy Jaysten Essner RDCS 09/04/2023, 1:35 PM

## 2023-09-04 NOTE — Progress Notes (Signed)
Mobility Specialist Progress Note:   09/04/23 1600  Mobility  Activity Ambulated with assistance in hallway  Level of Assistance Contact guard assist, steadying assist  Assistive Device None  Distance Ambulated (ft) 425 ft  Activity Response Tolerated well  Mobility Referral Yes  $Mobility charge 1 Mobility  Mobility Specialist Start Time (ACUTE ONLY) 1550  Mobility Specialist Stop Time (ACUTE ONLY) 1600  Mobility Specialist Time Calculation (min) (ACUTE ONLY) 10 min    Pre Mobility: 60 HR,  97% SpO2 During Mobility: 88 HR Post Mobility:  80 HR  Pt received in bed, eager to mobility. Asymptomatic throughout w/ no complaints. Pt left ambulating in room w/ call bell near and all needs met.  [place] Earlene Plater Mobility Specialist Please contact via Special educational needs teacher or Rehab office at [phone]

## 2023-09-04 NOTE — Progress Notes (Signed)
Post dose EKG reviewed SB 54bpm, short PR QTc Continue Tikosyn load  Francis Dowse, PA-C

## 2023-09-05 ENCOUNTER — Encounter (HOSPITAL_COMMUNITY)
Admission: EM | Disposition: A | Discharge status: Home / Self Care | Payer: Self-pay | Source: Home / Self Care | Attending: Internal Medicine

## 2023-09-05 DIAGNOSIS — I4719 Other supraventricular tachycardia: Secondary | ICD-10-CM | POA: Diagnosis not present

## 2023-09-05 LAB — BASIC METABOLIC PANEL
Anion gap: 11 (ref 5–15)
BUN: 23 mg/dL (ref 8–23)
CO2: 25 mmol/L (ref 22–32)
Calcium: 9.2 mg/dL (ref 8.9–10.3)
Chloride: 101 mmol/L (ref 98–111)
Creatinine, Ser: 0.9 mg/dL (ref 0.44–1.00)
GFR, Estimated: >60 mL/min (ref >60)
Glucose, Bld: 94 mg/dL (ref 70–99)
Potassium: 4.6 mmol/L (ref 3.5–5.1)
Sodium: 137 mmol/L (ref 135–145)

## 2023-09-05 LAB — MAGNESIUM: Magnesium: 2.1 mg/dL (ref 1.7–2.4)

## 2023-09-05 SURGERY — CARDIOVERSION
Anesthesia: General

## 2023-09-05 NOTE — Care Management Important Message (Signed)
Important Message  Patient Details  Name: Pamela Yang MRN: [id number] Date of Birth: 1951-04-13   Medicare Important Message Given:  Yes     Imagene Boss Stefan Church 09/05/2023, 2:50 PM

## 2023-09-05 NOTE — Progress Notes (Signed)
Pharmacy: Dofetilide (Tikosyn) - Follow Up Assessment and Electrolyte Replacement  Pharmacy consulted to assist in monitoring and replacing electrolytes in this 72 y.o. female admitted on 09/02/2023 undergoing dofetilide initiation. First dofetilide dose: 09/03/23  Labs:    Component Value Date/Time   K 4.6 09/05/2023 0211   MG 2.1 09/05/2023 0211     Plan: Potassium: K >/= 4: No additional supplementation needed  Magnesium: Mg > 2: No additional supplementation needed   Fredonia Highland, PharmD, BCPS, [facility] Clinical Pharmacist [phone] Please check AMION for all [facility] Pharmacy numbers 09/05/2023

## 2023-09-05 NOTE — Progress Notes (Signed)
Overnight patient's heart rate sustaining in low 40s and dropping down to as low as 38 (non sustained). Patient asymptomatic. On call cardiologist made aware. Received verbal order to not give the next dose of tikosyn until med adjusted.

## 2023-09-05 NOTE — Progress Notes (Signed)
Post dose EKG reviewed SB/junctional beats, all narroe QRS QTc remains acceptable to continue. Pt with known baseline bradycardia Do not hold tikosyn for bradycardia please  Francis Dowse, PA-C

## 2023-09-05 NOTE — Progress Notes (Signed)
Mobility Specialist Progress Note:   09/05/23 1000  Mobility  Activity Ambulated independently in hallway  Level of Assistance Independent  Assistive Device None  Distance Ambulated (ft) 950 ft (475 x2)  Activity Response Tolerated well  Mobility Referral Yes  $Mobility charge 1 Mobility  Mobility Specialist Start Time (ACUTE ONLY) 0947  Mobility Specialist Stop Time (ACUTE ONLY) 0959  Mobility Specialist Time Calculation (min) (ACUTE ONLY) 12 min    Pre Mobility: 69 HR During Mobility: 82 HR Post Mobility:  76 HR  Pt received in bed, eager to mobility. Asymptomatic throughout w/ no complaints. Pt left ambulating in room with call bell near and all needs met.  Pamela Yang Mobility Specialist Please contact via Special educational needs teacher or Rehab office at [phone]

## 2023-09-05 NOTE — Progress Notes (Addendum)
Rounding Note    Patient Name: Pamela Yang Date of Encounter: 09/05/2023  [facility] Cardiologist: Dina Rich, MD   Subjective   Feels well  Inpatient Medications    Scheduled Meds:  dofetilide  250 mcg Oral BID   potassium chloride  10 mEq Oral Daily   rivaroxaban  15 mg Oral Q supper   sertraline  50 mg Oral Daily   sodium chloride flush  3 mL Intravenous Q12H   sodium chloride flush  3 mL Intravenous Q12H   Continuous Infusions:  sodium chloride     PRN Meds: sodium chloride, acetaminophen **OR** acetaminophen, metoprolol tartrate, metoprolol tartrate, senna-docusate, sodium chloride flush   Vital Signs    Vitals:   09/05/23 0528 09/05/23 0544 09/05/23 0721 09/05/23 0725  BP:    124/65  Pulse: (!) 44 (!) 41  (!) 46  Resp: 16 12  19   Temp:    97.6 F (36.4 C)  TempSrc:   Oral Oral  SpO2:  97% 97% 99%  Weight:      Height:        Intake/Output Summary (Last 24 hours) at 09/05/2023 0845 Last data filed at 09/04/2023 1933 Gross per 24 hour  Intake 120 ml  Output --  Net 120 ml      09/02/2023    4:38 PM 09/02/2023   11:47 AM 08/31/2023    7:32 PM  Last 3 Weights  Weight (lbs) 105 lb 105 lb 105 lb  Weight (kg) 47.628 kg 47.628 kg 47.628 kg      Telemetry    SB 40's-50's mostly, tranient 30's overnight, known for her, has had a few very brief PATs - Personally Reviewed  ECG    SB 46bpm, QTc - Personally Reviewed with Dr. Nelly Laurence  Physical Exam   GEN: No acute distress.   Neck: No JVD Cardiac: RRR, bradycardic, no murmurs, rubs, or gallops.  Respiratory: CTA b/l. GI: Soft, nontender, non-distended  MS: No edema; No deformity. Neuro:  Nonfocal  Psych: Normal affect   Labs    High Sensitivity Troponin:   Recent Labs  Lab 08/31/23 1945 08/31/23 2153 09/02/23 1149 09/02/23 1337  TROPONINIHS 6 13 11 10      Chemistry Recent Labs  Lab 08/31/23 1945 08/31/23 1945 09/02/23 1149 09/03/23 0354 09/03/23 1737  09/04/23 0216 09/05/23 0211  NA 137  --  136 138 136 138 137  K 3.6  --  3.9 3.6 4.6 4.2 4.6  CL 101  --  103 104 100 106 101  CO2 22  --  21* 24 24 25 25   GLUCOSE 131*  --  188* 90 141* 97 94  BUN 27*  --  19 14 19 20 23   CREATININE 0.92  --  1.02* 0.77 0.89 0.83 0.90  CALCIUM 10.2  --  9.4 9.3 9.6 9.0 9.2  MG  --    < > 2.2 2.0  --  2.3 2.1  PROT 7.7  --  7.6  --   --   --   --   ALBUMIN 4.4  --  4.5  --   --   --   --   AST 44*  --  36  --   --   --   --   ALT 30  --  31  --   --   --   --   ALKPHOS 69  --  64  --   --   --   --  BILITOT 0.6  --  0.9  --   --   --   --   GFRNONAA >60  --  58* >60 >60 >60 >60  ANIONGAP 14  --  12 10 12 7 11    < > = values in this interval not displayed.    Lipids No results for input(s): "CHOL", "TRIG", "HDL", "LABVLDL", "LDLCALC", "CHOLHDL" in the last 168 hours.  Hematology Recent Labs  Lab 08/31/23 1945 09/02/23 1149 09/03/23 0354  WBC 8.8 8.1 4.9  RBC 4.77 5.18* 4.65  HGB 13.3 14.2 12.5  HCT 40.9 45.0 39.2  MCV 85.7 86.9 84.3  MCH 27.9 27.4 26.9  MCHC 32.5 31.6 31.9  RDW 14.2 14.3 13.9  PLT 208 207 163   Thyroid  Recent Labs  Lab 09/02/23 1841  TSH 1.296    BNPNo results for input(s): "BNP", "PROBNP" in the last 168 hours.  DDimer No results for input(s): "DDIMER" in the last 168 hours.   Radiology      Cardiac Studies   09/04/23: TTE 1. Intermittent junctional rhythm.   2. Left ventricular ejection fraction, by estimation, is 60 to 65%. The  left ventricle has normal function. The left ventricle has no regional  wall motion abnormalities. Left ventricular diastolic parameters are  indeterminate.   3. Right ventricular systolic function is normal. The right ventricular  size is normal. There is normal pulmonary artery systolic pressure.   4. A small pericardial effusion is present.   5. The mitral valve is normal in structure. Trivial mitral valve  regurgitation. No evidence of mitral stenosis.   6. Tricuspid  valve regurgitation is moderate.   7. The aortic valve is tricuspid. Aortic valve regurgitation is not  visualized. Aortic valve sclerosis/calcification is present, without any  evidence of aortic stenosis.   8. The inferior vena cava is dilated in size with >50% respiratory  variability, suggesting right atrial pressure of 8 mmHg.   03/15/23: EPS/ablation CONCLUSIONS:   1. Isthmus-dependent counter clockwise right atrial flutter.   2. Successful radiofrequency ablation of atrial flutter along the cavotricuspid isthmus with complete bidirectional isthmus block achieved.   3. No inducible arrhythmias following ablation.   4. No early apparent complications.   Patient Profile     72 y.o. female w/PMHx GERD, gastroparesis, AFib (? Unclear), and AFlutter  Hx of flecainide >> stopped with SB and junctional rhythm CTI ablation March 2024  Assessment & Plan   SVT > AFib/AFlutter CHA2DS2Vasc is 2 for age/gender on Xarelto appropriately dosed Echo is pending Tikosyn load is in progress K+  4.6 Mag 2.1 Creat 0.90 QTc remains stable  D/w RN this morning, do NOT hold tikosyn for bradycardia  Converted with drug Anticipate discharge tomorrow           For questions or updates, please contact [facility] Please consult [email or web address] for contact info under        Signed, Sheilah Pigeon, PA-C  09/05/2023, 8:45 AM

## 2023-09-05 NOTE — TOC Initial Note (Addendum)
Transition of Care [facility]) - Initial/Assessment Note    Patient Details  Name: Pamela Yang MRN: [id number] Date of Birth: 03/24/1951  Transition [facility] Care [facility]) CM/SW Contact:    Leone Haven, RN Phone Number: 09/05/2023, 4:45 PM  Clinical Narrative:                 From home alone, has PCP and insurance on file, states has no HH services in place at this time or DME at home.  States her neighbor, Samul Dada will transport her home at dc and neighbor is support system, states gets medications from [facility] in [place].  Pta self ambulatory. On Tikosyn. NCM will assist patient with good RX information. Patient will get a 30 day supply that will cost 30.00 from TOC.  Please escribe  90 day prescription refills to [facility] [place] for Tikosyn.  Expected Discharge Plan: Home/Self Care Barriers to Discharge: Continued Medical Work up   Patient Goals and CMS Choice Patient states their goals for this hospitalization and ongoing recovery are:: return home   Choice offered to / list presented to : NA      Expected Discharge Plan and Services In-house Referral: NA Discharge Planning Services: CM Consult Post Acute Care Choice: NA Living arrangements for the past 2 months: Single Family Home                 DME Arranged: N/A DME Agency: NA       HH Arranged: NA          Prior Living Arrangements/Services Living arrangements for the past 2 months: Single Family Home Lives with:: Self Patient language and need for interpreter reviewed:: Yes Do you feel safe going back to the place where you live?: Yes      Need for Family Participation in Patient Care: No (Comment) Care giver support system in place?: No (comment)   Criminal Activity/Legal Involvement Pertinent to Current Situation/Hospitalization: No - Comment as needed  Activities of Daily Living Home Assistive Devices/Equipment: None ADL Screening (condition at time of  admission) Patient's cognitive ability adequate to safely complete daily activities?: Yes Is the patient deaf or have difficulty hearing?: No Does the patient have difficulty seeing, even when wearing glasses/contacts?: No Does the patient have difficulty concentrating, remembering, or making decisions?: No Patient able to express need for assistance with ADLs?: Yes Does the patient have difficulty dressing or bathing?: No Independently performs ADLs?: Yes (appropriate for developmental age) Does the patient have difficulty walking or climbing stairs?: No Weakness of Legs: None Weakness of Arms/Hands: None  Permission Sought/Granted Permission sought to share information with : Case Manager Permission granted to share information with : Yes, Verbal Permission Granted              Emotional Assessment   Attitude/Demeanor/Rapport: Engaged Affect (typically observed): Appropriate Orientation: : Oriented to Self, Oriented to Place, Oriented to  Time, Oriented to Situation Alcohol / Substance Use: Not Applicable Psych Involvement: No (comment)  Admission diagnosis:  Junctional tachycardia [I47.19] Tachycardia [R00.0] Patient Active Problem List   Diagnosis Date Noted   Junctional tachycardia 09/02/2023   Tachycardia 09/02/2023   Atrial flutter (HCC) 02/27/2023   Age-related osteoporosis without current pathological fracture 12/21/2021   Elevated LFTs 10/07/2021   Body mass index (BMI) 19.9 or less, adult 08/13/2020   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   SVT (supraventricular tachycardia) 05/22/2020   Pain in joint of right ankle 12/08/2018   Depression with anxiety  02/28/2018   Osteoporosis 08/13/2014   DRY MOUTH 03/31/2009   GASTROPARESIS 03/31/2009   PCP:  Babs Sciara, MD Pharmacy:   [facility] [place], [place] - [address] [place] [address] Phone: [phone] Fax: [phone]     Social Determinants of Health  (SDOH) Social History: SDOH Screenings   Food Insecurity: No Food Insecurity (09/03/2023)  Housing: Low Risk  (09/03/2023)  Transportation Needs: No Transportation Needs (09/03/2023)  Utilities: Not At Risk (09/03/2023)  Alcohol Screen: Low Risk  (12/30/2022)  Depression (PHQ2-9): Low Risk  (01/27/2023)  Financial Resource Strain: Low Risk  (12/30/2022)  Physical Activity: Sufficiently Active (12/30/2022)  Social Connections: Moderately Integrated (12/30/2022)  Stress: No Stress Concern Present (12/30/2022)  Tobacco Use: Medium Risk (09/02/2023)   SDOH Interventions:     Readmission Risk Interventions     No data to display

## 2023-09-06 ENCOUNTER — Other Ambulatory Visit (HOSPITAL_COMMUNITY): Payer: Self-pay

## 2023-09-06 DIAGNOSIS — I4719 Other supraventricular tachycardia: Secondary | ICD-10-CM | POA: Diagnosis not present

## 2023-09-06 MED ORDER — DOFETILIDE 250 MCG PO CAPS
250.0000 ug | ORAL_CAPSULE | Freq: Two times a day (BID) | ORAL | 5 refills | Status: DC
Start: 2023-09-06 — End: 2023-09-13
  Filled 2023-09-06: qty 60, 30d supply, fill #0

## 2023-09-06 MED ORDER — POTASSIUM CHLORIDE CRYS ER 10 MEQ PO TBCR
10.0000 meq | EXTENDED_RELEASE_TABLET | Freq: Every day | ORAL | 5 refills | Status: DC
  Filled 2023-09-06: qty 30, 30d supply, fill #0

## 2023-09-06 MED ORDER — DOFETILIDE 250 MCG PO CAPS: 250.0000 ug | ORAL_CAPSULE | Freq: Two times a day (BID) | ORAL | 1 refills | Status: DC

## 2023-09-06 MED ORDER — MAGNESIUM SULFATE 2 GM/50ML IV SOLN
2.0000 g | Freq: Once | INTRAVENOUS | Status: AC
  Administered 2023-09-06: 2 g via INTRAVENOUS
  Filled 2023-09-06: qty 50

## 2023-09-06 NOTE — TOC Transition Note (Signed)
Transition of Care [facility]) - CM/SW Discharge Note   Patient Details  Name: Pamela Yang MRN: [id number] Date of Birth: October 01, 1951  Transition of [facility]) CM/SW Contact:  Leone Haven, RN Phone Number: 09/06/2023, 11:22 AM   Clinical Narrative:    For possible dc today, she has transport home.  Will be on tikosyn.  TOC to fill meds.  Will get 30 days for 30.00. NCM notified Renee to  Please escribe  90 day prescription refills to [facility] [place] for Tikosyn.    Final next level of care: Home/Self Care Barriers to Discharge: Continued Medical Work up   Patient Goals and CMS Choice   Choice offered to / list presented to : NA  Discharge Placement                         Discharge Plan and Services Additional resources added to the After Visit Summary for   In-house Referral: NA Discharge Planning Services: CM Consult Post Acute Care Choice: NA          DME Arranged: N/A DME Agency: NA       HH Arranged: NA          Social Determinants of Health (SDOH) Interventions SDOH Screenings   Food Insecurity: No Food Insecurity (09/03/2023)  Housing: Low Risk  (09/03/2023)  Transportation Needs: No Transportation Needs (09/03/2023)  Utilities: Not At Risk (09/03/2023)  Alcohol Screen: Low Risk  (12/30/2022)  Depression (PHQ2-9): Low Risk  (01/27/2023)  Financial Resource Strain: Low Risk  (12/30/2022)  Physical Activity: Sufficiently Active (12/30/2022)  Social Connections: Moderately Integrated (12/30/2022)  Stress: No Stress Concern Present (12/30/2022)  Tobacco Use: Medium Risk (09/02/2023)     Readmission Risk Interventions     No data to display

## 2023-09-06 NOTE — Discharge Summary (Addendum)
ELECTROPHYSIOLOGY PROCEDURE DISCHARGE SUMMARY    Patient ID: Pamela Yang,  MRN: [id number], DOB/AGE: Apr 04, 1951 72 y.o.  Admit date: 09/02/2023 Discharge date: 09/06/2023  Primary Care Physician: Babs Sciara, MD  Primary Cardiologist: Dr. Wyline Mood Electrophysiologist: Dr. Nelly Laurence  Primary Discharge Diagnosis:  1.   AFlutter Unclear hx of AFib CHA2DS2Vasc is 2, on Xarelto 2.   SVT 3.   Asymptomatic bradycardia  Secondary Discharge Diagnosis:  GERD gastroparesis  No Known Allergies   Procedures This Admission:  1.  Tikosyn loading   Brief HPI: Pamela Yang is a 72 y.o. female with a past medical history as noted above, admitted with  recurrent episodes of tachycardia and SOB, found in an ATach vs AFlutter and admitted  Hospital Course:  The patient was admitted, EP consulted given baseline bradycardia for rhythm control strategies, and started on Tikosyn was initiated.  Renal function and electrolytes were followed during the hospitalization.  She converted with drug, her QTc remained stable.  The patient was monitored until discharge on telemetrypost conversion maintaining SB 40's-50s with some intermittent junctional rhythm, her junctional rates reaching 60's-70's, all with same narrow QRS morphology.  This is known for her and asymptomatic.  On the day of discharge, she feels well, was examined by Dr Nelly Laurence who considered the patient stable for discharge to home.  Follow-up has been arranged with the AFib clinic in 1 week and EP team in a month  Tikosyn teaching was completed K+ daily electrolyte replacement for home TOC for 1st fill , [facility] for 90day supply refills with plans for Good rx Potassium refills will go to her usual [place] pharmacy  I have advised patient to hold off on 90 day supply/picking up refills until seen next week at her AFib clinic visit, if stable at that visit then maker sure the [facility] [place]  has her prescription and getting it ordered/in stock for her  Physical Exam: Vitals:   09/05/23 2312 09/06/23 0405 09/06/23 0740 09/06/23 1100  BP: (!) 105/59 121/73 123/66 105/65  Pulse: (!) 43 (!) 49 (!) 49 (!) 45  Resp: 13 15 13 16   Temp: 98 F (36.7 C) 97.7 F (36.5 C) 97.7 F (36.5 C) 97.6 F (36.4 C)  TempSrc: Oral Oral Oral Oral  SpO2: 97% 98% 99% 100%  Weight:      Height:         GEN- The patient is well appearing, alert and oriented x 3 today.   HEENT: normocephalic, atraumatic; sclera clear, conjunctiva pink; hearing intact; oropharynx clear; neck supple, no JVP Lymph- no cervical lymphadenopathy Lungs- CTA b/l, normal work of breathing.  No wheezes, rales, rhonchi Heart- RRR, no murmurs, rubs or gallops, PMI not laterally displaced GI- soft, non-tender, non-distended Extremities- no clubbing, cyanosis, or edema MS- no significant deformity or atrophy Skin- warm and dry, no rash or lesion Psych- euthymic mood, full affect Neuro- strength and sensation are intact   Labs:   Lab Results  Component Value Date   WBC 4.9 09/03/2023   HGB 12.5 09/03/2023   HCT 39.2 09/03/2023   MCV 84.3 09/03/2023   PLT 163 09/03/2023    Recent Labs  Lab 09/02/23 1149 09/03/23 0354 09/06/23 0232  NA 136   < > 138  K 3.9   < > 4.3  CL 103   < > 106  CO2 21*   < > 26  BUN 19   < > 22  CREATININE 1.02*   < > 0.84  CALCIUM 9.4   < > 9.2  PROT 7.6  --   --   BILITOT 0.9  --   --   ALKPHOS 64  --   --   ALT 31  --   --   AST 36  --   --   GLUCOSE 188*   < > 100*   < > = values in this interval not displayed.     Discharge Medications:  Allergies as of 09/06/2023   No Known Allergies      Medication List     TAKE these medications    denosumab 60 MG/ML Sosy injection Commonly known as: PROLIA Inject 60 mg into the skin every 6 (six) months.   dofetilide 250 MCG capsule Commonly known as: TIKOSYN Take 1 capsule (250 mcg total) by mouth 2 (two) times  daily.   Flaxseed Oil 1200 MG Caps Take 1,200 mg by mouth 2 (two) times daily.   metoprolol tartrate 25 MG tablet Commonly known as: LOPRESSOR Take 1 tablet (25 mg total) by mouth 2 (two) times daily as needed (Palpitations and elevated heart rate).   multivitamin with minerals tablet Take 1 tablet by mouth daily.   potassium chloride 10 MEQ tablet Commonly known as: KLOR-CON M Take 1 tablet (10 mEq total) by mouth daily. Start taking on: September 07, 2023 What changed:  medication strength how much to take how to take this when to take this additional instructions   sertraline 50 MG tablet Commonly known as: ZOLOFT 1 by mouth PO QD What changed:  how much to take how to take this when to take this additional instructions   Systane Balance 0.6 % Soln Generic drug: Propylene Glycol Place 2 drops into both eyes in the morning and at bedtime. Into both eyes   VITAMIN C PO Take 500 mg by mouth in the morning and at bedtime.   Xarelto 15 MG Tabs tablet Generic drug: Rivaroxaban TAKE ONE (1) TABLET BY MOUTH EVERY DAY What changed: how much to take        Disposition: Home Discharge Instructions     Diet - low sodium heart healthy   Complete by: As directed    Increase activity slowly   Complete by: As directed         Duration of Discharge Encounter: Greater than 30 minutes including physician time.  Norma Fredrickson, PA-C 09/06/2023 1:09 PM

## 2023-09-06 NOTE — Progress Notes (Signed)
Pt has been seen by Dr. Nelly Laurence EKG/telemetry reviewed Stable QTc Continue tikosyn Load will finish with this AM dose Anticipate discharge later today  Francis Dowse, PA-C

## 2023-09-06 NOTE — Progress Notes (Addendum)
Pharmacy: Dofetilide (Tikosyn) - Follow Up Assessment and Electrolyte Replacement  Pharmacy consulted to assist in monitoring and replacing electrolytes in this 72 y.o. female admitted on 09/02/2023 undergoing dofetilide initiation. First dofetilide dose: 09/03/23  Labs:    Component Value Date/Time   K 4.3 09/06/2023 0232   MG 2.0 09/06/2023 0232     Plan: Potassium: K >/= 4: No additional supplementation needed - on 10 mEq daily  Magnesium: Mg 1.8-2: Give Mg 2 gm IV x1   Pamela Yang, PharmD Clinical Pharmacist 09/06/2023  7:47 AM

## 2023-09-06 NOTE — Progress Notes (Signed)
Went over discharge paperwork with patient and all questions answered. PIV and telemetry removed. All belongings at bedside.

## 2023-09-06 NOTE — Progress Notes (Signed)
Mobility Specialist Progress Note:   09/06/23 1000  Mobility  Activity Ambulated independently in hallway  Level of Assistance Independent after set-up  Assistive Device None  Distance Ambulated (ft) 1425 ft  Activity Response Tolerated well  Mobility Referral Yes  $Mobility charge 1 Mobility  Mobility Specialist Start Time (ACUTE ONLY) [phone]  Mobility Specialist Stop Time (ACUTE ONLY) 0943  Mobility Specialist Time Calculation (min) (ACUTE ONLY) 16 min    Received pt in bed having no complaints and agreeable to mobility. Pt was asymptomatic throughout ambulation and returned to room w/o fault. Left in bed w/ call bell in reach and all needs met.   D'Vante Earlene Plater Mobility Specialist Please contact via Special educational needs teacher or Rehab office at [phone]

## 2023-09-11 ENCOUNTER — Telehealth: Payer: Self-pay

## 2023-09-11 NOTE — Transitions of Care (Post Inpatient/ED Visit) (Signed)
09/11/2023  Name: Pamela Yang MRN: [id number] DOB: Jun 10, 1951  Today's TOC FU Call Status: Today's TOC FU Call Status:: Successful TOC FU Call Completed TOC FU Call Complete Date: 09/11/23 Patient's Name and Date of Birth confirmed.  Transition Care Management Follow-up Telephone Call Date of Discharge: 09/06/23 Discharge Facility: [facility]) Type of Discharge: Inpatient Admission Primary Inpatient Discharge Diagnosis:: "junctional tachycardia" How have you been since you were released from the hospital?: Better (Pt voices she is 'getting a little stronger eevry day." She is up & walking around with no issues. Appetite good. LBM yest. She is resting/sleeping well. Denies any acute sxs.) Any questions or concerns?: No  Items Reviewed: Did you receive and understand the discharge instructions provided?: Yes Medications obtained,verified, and reconciled?: Yes (Medications Reviewed) Any new allergies since your discharge?: No Dietary orders reviewed?: Yes Type of Diet Ordered:: low salt/heart healthy Do you have support at home?: Yes People in Home: alone Name of Support/Comfort Primary Source: pt voices she has friends/neighbors who assist her as needed  Medications Reviewed Today: Medications Reviewed Today     Reviewed by Charlyn Minerva, RN (Registered Nurse) on 09/11/23 at 1233  Med List Status: <None>   Medication Order Taking? Sig Documenting Provider Last Dose Status Informant  Ascorbic Acid (VITAMIN C PO) [id number] Yes Take 500 mg by mouth in the morning and at bedtime. [provider] Taking Active Self  denosumab (PROLIA) 60 MG/ML SOSY injection [id number] Yes Inject 60 mg into the skin every 6 (six) months. [provider] Taking Active Self  dofetilide (TIKOSYN) 250 MCG capsule [id number] Yes Take 1 capsule (250 mcg total) by mouth 2 (two) times daily. Sheilah Pigeon, PA-C Taking Active   dofetilide [facility]) 250 MCG capsule  [id number]  Take 1 capsule (250 mcg total) by mouth 2 (two) times daily. Mayl, Delton See, MD  Active   Flaxseed, Linseed, (FLAXSEED OIL) 1200 MG CAPS [id number] Yes Take 1,200 mg by mouth 2 (two) times daily. [provider] Taking Active Self  metoprolol tartrate (LOPRESSOR) 25 MG tablet [id number] Yes Take 1 tablet (25 mg total) by mouth 2 (two) times daily as needed (Palpitations and elevated heart rate). Sabas Sous, MD Taking Active Self  Multiple Vitamins-Minerals (MULTIVITAMIN WITH MINERALS) tablet [id number] Yes Take 1 tablet by mouth daily. [provider] Taking Active Self  potassium chloride (KLOR-CON M) 10 MEQ tablet [id number] Yes Take 1 tablet (10 mEq total) by mouth daily. Sheilah Pigeon, PA-C Taking Active   Propylene Glycol (SYSTANE BALANCE) 0.6 % SOLN [id number] Yes Place 2 drops into both eyes in the morning and at bedtime. Into both eyes [provider] Taking Active Self  sertraline (ZOLOFT) 50 MG tablet [id number] Yes 1 by mouth PO QD  Patient taking differently: Take 50 mg by mouth daily.   Campbell Riches, NP Taking Active Self  XARELTO 15 MG TABS tablet [id number] Yes TAKE ONE (1) TABLET BY MOUTH EVERY DAY  Patient taking differently: Take 15 mg by mouth daily.   Antoine Poche, MD Taking Active Self            Home Care and Equipment/Supplies: Were Home Health Services Ordered?: NA Any new equipment or medical supplies ordered?: NA  Functional Questionnaire: Do you need assistance with bathing/showering or dressing?: No Do you need assistance with meal preparation?: No Do you need assistance with eating?: No Do you have difficulty maintaining continence: No Do you need assistance with getting out of  bed/getting out of a chair/moving?: No Do you have difficulty managing or taking your medications?: No  Follow up appointments reviewed: PCP Follow-up appointment confirmed?: No MD Provider Line Number:[phone] Given:  No Specialist Hospital Follow-up appointment confirmed?: Yes Date of Specialist follow-up appointment?: 09/13/23 Follow-Up Specialty Provider:: [facility] Do you need transportation to your follow-up appointment?: No Do you understand care options if your condition(s) worsen?: Yes-patient verbalized understanding  SDOH Interventions Today    Flowsheet Row Most Recent Value  SDOH Interventions   Food Insecurity Interventions Intervention Not Indicated  Transportation Interventions Intervention Not Indicated      TOC Interventions Today    Flowsheet Row Most Recent Value  TOC Interventions   TOC Interventions Discussed/Reviewed TOC Interventions Discussed      Interventions Today    Flowsheet Row Most Recent Value  Chronic Disease   Chronic disease during today's visit Atrial Fibrillation (AFib)  General Interventions   General Interventions Discussed/Reviewed General Interventions Discussed, Durable Medical Equipment (DME), Doctor Visits, Referral to Nurse  [pt declined need for ongoing TOC calls-states she is doing well]  Doctor Visits Discussed/Reviewed PCP, Specialist  Durable Medical Equipment (DME) BP Cuff  [pt has BP/HR monitor in the home-checks readings occassionally-states she can tell when "she is out of rhythm"]  PCP/Specialist Visits Compliance with follow-up visit  Education Interventions   Education Provided Provided Education  Provided Verbal Education On Nutrition, When to see the doctor, Medication, Other  [sx mgmt]  Nutrition Interventions   Nutrition Discussed/Reviewed Nutrition Discussed, Fluid intake, Decreasing salt, Adding fruits and vegetables  Pharmacy Interventions   Pharmacy Dicussed/Reviewed Pharmacy Topics Discussed, Medications and their functions  Safety Interventions   Safety Discussed/Reviewed Safety Discussed        Alessandra Grout [facility] Care Management Care Management Community Coordinator Direct  Phone: [phone] Toll Free: [phone] Fax: [phone]

## 2023-09-13 ENCOUNTER — Ambulatory Visit (HOSPITAL_COMMUNITY)
Admit: 2023-09-13 | Discharge: 2023-09-13 | Disposition: A | Discharge status: Home / Self Care | Payer: Medicare Other | Attending: Internal Medicine | Admitting: Internal Medicine

## 2023-09-13 VITALS — BP 106/62 | HR 46 | Ht 64.0 in | Wt 107.2 lb

## 2023-09-13 DIAGNOSIS — I4892 Unspecified atrial flutter: Secondary | ICD-10-CM | POA: Insufficient documentation

## 2023-09-13 DIAGNOSIS — I480 Paroxysmal atrial fibrillation: Secondary | ICD-10-CM

## 2023-09-13 DIAGNOSIS — Z79899 Other long term (current) drug therapy: Secondary | ICD-10-CM | POA: Insufficient documentation

## 2023-09-13 DIAGNOSIS — I484 Atypical atrial flutter: Secondary | ICD-10-CM

## 2023-09-13 DIAGNOSIS — I4891 Unspecified atrial fibrillation: Secondary | ICD-10-CM | POA: Insufficient documentation

## 2023-09-13 DIAGNOSIS — Z5181 Encounter for therapeutic drug level monitoring: Secondary | ICD-10-CM | POA: Diagnosis not present

## 2023-09-13 DIAGNOSIS — K219 Gastro-esophageal reflux disease without esophagitis: Secondary | ICD-10-CM | POA: Diagnosis not present

## 2023-09-13 LAB — BASIC METABOLIC PANEL
Anion gap: 10 (ref 5–15)
BUN: 18 mg/dL (ref 8–23)
CO2: 25 mmol/L (ref 22–32)
Calcium: 9.6 mg/dL (ref 8.9–10.3)
Chloride: 104 mmol/L (ref 98–111)
Creatinine, Ser: 0.78 mg/dL (ref 0.44–1.00)
GFR, Estimated: >60 mL/min (ref >60)
Glucose, Bld: 79 mg/dL (ref 70–99)
Potassium: 4.9 mmol/L (ref 3.5–5.1)
Sodium: 139 mmol/L (ref 135–145)

## 2023-09-13 LAB — MAGNESIUM: Magnesium: 2.2 mg/dL (ref 1.7–2.4)

## 2023-09-13 MED ORDER — POTASSIUM CHLORIDE CRYS ER 10 MEQ PO TBCR: 10.0000 meq | EXTENDED_RELEASE_TABLET | Freq: Every day | ORAL | 5 refills | Status: DC

## 2023-09-13 MED ORDER — DOFETILIDE 250 MCG PO CAPS: 250.0000 ug | ORAL_CAPSULE | Freq: Two times a day (BID) | ORAL | 1 refills | Status: DC

## 2023-09-13 NOTE — Progress Notes (Signed)
Primary Care Physician: Babs Sciara, MD Primary Cardiologist: Dina Rich, MD Electrophysiologist: Maurice Small, MD     Referring Physician: Dr. Odie Sera Pamela Yang is a 72 y.o. female with a history of GERD, SVT, junctional tachycardia, and atrial flutter who presents for consultation in the [facility].  History of junctional rhythm resolved off of flecainide. Recent ED visits for atrial flutter with RVR and possibly SVT; seen again for same on 9/14 and admitted for Tikosyn load. Discharged on Tikosyn 250 mcg BID. Patient is on Xarelto 15 mg daily for a CHADS2VASC score of 2.  On evaluation today, patient is currently in NSR. She did not require cardioversion during Tikosyn load admission. The patient was monitored until discharge on telemetry post conversion maintaining SB 40's-50s with some intermittent junctional rhythm, her junctional rates reaching 60's-70's, all with same narrow QRS morphology. This is known for her and asymptomatic. She has not missed any doses of Tikosyn or Xarelto. She feels well overall.   Today, she denies symptoms of palpitations, chest pain, shortness of breath, orthopnea, PND, lower extremity edema, dizziness, presyncope, syncope, snoring, daytime somnolence, bleeding, or neurologic sequela. The patient is tolerating medications without difficulties and is otherwise without complaint today.    she has a BMI of Body mass index is 18.4 kg/m.Marland Kitchen Filed Weights   09/13/23 0914  Weight: 48.6 kg    Current Outpatient Medications  Medication Sig Dispense Refill   Ascorbic Acid (VITAMIN C PO) Take 500 mg by mouth in the morning and at bedtime.     denosumab (PROLIA) 60 MG/ML SOSY injection Inject 60 mg into the skin every 6 (six) months.     Flaxseed, Linseed, (FLAXSEED OIL) 1200 MG CAPS Take 1,200 mg by mouth 2 (two) times daily.     metoprolol tartrate (LOPRESSOR) 25 MG tablet Take 1 tablet (25 mg total) by  mouth 2 (two) times daily as needed (Palpitations and elevated heart rate). 30 tablet 0   Multiple Vitamins-Minerals (MULTIVITAMIN WITH MINERALS) tablet Take 1 tablet by mouth daily.     Propylene Glycol (SYSTANE BALANCE) 0.6 % SOLN Place 2 drops into both eyes in the morning and at bedtime. Into both eyes     sertraline (ZOLOFT) 50 MG tablet 1 by mouth PO QD (Patient taking differently: Take 50 mg by mouth daily.) 90 tablet 3   XARELTO 15 MG TABS tablet TAKE ONE (1) TABLET BY MOUTH EVERY DAY (Patient taking differently: Take 15 mg by mouth daily.) 90 tablet 1   dofetilide (TIKOSYN) 250 MCG capsule Take 1 capsule (250 mcg total) by mouth 2 (two) times daily. 180 capsule 1   potassium chloride (KLOR-CON M) 10 MEQ tablet Take 1 tablet (10 mEq total) by mouth daily. 30 tablet 5   No current facility-administered medications for this encounter.    Atrial Fibrillation Management history:  Previous antiarrhythmic drugs: flecainide Previous cardioversions: none Previous ablations: 03/15/23 Anticoagulation history: Xarelto 15   ROS- All systems are reviewed and negative except as per the HPI above.  Physical Exam: BP 106/62   Pulse (!) 46   Ht 5\' 4"  (1.626 m)   Wt 48.6 kg   BMI 18.40 kg/m   GEN: Well nourished, well developed in no acute distress NECK: No JVD; No carotid bruits CARDIAC: Regular bradycardic rate and rhythm, no murmurs, rubs, gallops RESPIRATORY:  Clear to auscultation without rales, wheezing or rhonchi  ABDOMEN: Soft, non-tender, non-distended EXTREMITIES:  No  edema; No deformity   EKG today demonstrates  Vent. rate 46 BPM PR interval * ms QRS duration 88 ms QT/QTcB 490/428 ms P-R-T axes * 81 65 Undetermined rhythm Anteroseptal infarct , age undetermined Abnormal ECG When compared with ECG of 06-Sep-2023 10:08, PREVIOUS ECG IS PRESENT  Echo 09/04/23 demonstrated   1. Intermittent junctional rhythm.   2. Left ventricular ejection fraction, by estimation, is 60  to 65%. The  left ventricle has normal function. The left ventricle has no regional  wall motion abnormalities. Left ventricular diastolic parameters are  indeterminate.   3. Right ventricular systolic function is normal. The right ventricular  size is normal. There is normal pulmonary artery systolic pressure.   4. A small pericardial effusion is present.   5. The mitral valve is normal in structure. Trivial mitral valve  regurgitation. No evidence of mitral stenosis.   6. Tricuspid valve regurgitation is moderate.   7. The aortic valve is tricuspid. Aortic valve regurgitation is not  visualized. Aortic valve sclerosis/calcification is present, without any  evidence of aortic stenosis.   8. The inferior vena cava is dilated in size with >50% respiratory  variability, suggesting right atrial pressure of 8 mmHg.    ASSESSMENT & PLAN CHA2DS2-VASc Score = 2  The patient's score is based upon: CHF History: 0 HTN History: 0 Diabetes History: 0 Stroke History: 0 Vascular Disease History: 0 Age Score: 1 Gender Score: 1       ASSESSMENT AND PLAN: Paroxysmal Atrial Flutter The patient's CHA2DS2-VASc score is 2, indicating a 2.2% annual risk of stroke.   S/p Atrial flutter ablation on 03/15/23.  S/p Tikosyn admission 9/14-18/24.  She is currently in sinus bradycardia vs junctional rhythm. She feels well overall. Qtc stable. Continue Tikosyn 250 mcg BID. Bmet and mag drawn today.   Follow up 1 month for Tikosyn surveillance.    Lake Bells, PA-C  [facility] [address] [place], [place] [address] [phone]

## 2023-09-21 ENCOUNTER — Telehealth: Payer: Self-pay

## 2023-09-21 DIAGNOSIS — M810 Age-related osteoporosis without current pathological fracture: Secondary | ICD-10-CM | POA: Diagnosis not present

## 2023-09-21 DIAGNOSIS — M8589 Other specified disorders of bone density and structure, multiple sites: Secondary | ICD-10-CM | POA: Diagnosis not present

## 2023-09-21 NOTE — Telephone Encounter (Signed)
Transition Care Management Unsuccessful Follow-up Telephone Call  Date of discharge and from where:  Pamela Yang 9/4  Attempts:  2nd Attempt  Reason for unsuccessful TCM follow-up call:  No answer/busy   Pamela Yang   [facility], [facility] Guide, Phone: [phone] Website: [name]

## 2023-09-22 ENCOUNTER — Ambulatory Visit: Payer: Medicare Other | Admitting: Cardiovascular Disease

## 2023-10-13 ENCOUNTER — Ambulatory Visit: Payer: Medicare Other | Admitting: Physician Assistant

## 2023-10-16 ENCOUNTER — Ambulatory Visit (HOSPITAL_COMMUNITY): Payer: Medicare Other | Admitting: Internal Medicine

## 2023-10-18 DIAGNOSIS — M5451 Vertebrogenic low back pain: Secondary | ICD-10-CM | POA: Diagnosis not present

## 2023-10-18 DIAGNOSIS — M25562 Pain in left knee: Secondary | ICD-10-CM | POA: Diagnosis not present

## 2023-10-23 ENCOUNTER — Ambulatory Visit: Payer: Medicare Other | Admitting: Physician Assistant

## 2023-10-23 ENCOUNTER — Ambulatory Visit (HOSPITAL_COMMUNITY)
Admission: RE | Admit: 2023-10-23 | Discharge: 2023-10-23 | Disposition: A | Discharge status: Home / Self Care | Payer: Medicare Other | Source: Ambulatory Visit | Attending: Internal Medicine | Admitting: Internal Medicine

## 2023-10-23 VITALS — BP 158/76 | HR 43 | Ht 64.0 in | Wt 108.0 lb

## 2023-10-23 DIAGNOSIS — I4892 Unspecified atrial flutter: Secondary | ICD-10-CM | POA: Diagnosis not present

## 2023-10-23 DIAGNOSIS — I4891 Unspecified atrial fibrillation: Secondary | ICD-10-CM | POA: Diagnosis not present

## 2023-10-23 DIAGNOSIS — I480 Paroxysmal atrial fibrillation: Secondary | ICD-10-CM | POA: Diagnosis not present

## 2023-10-23 DIAGNOSIS — R001 Bradycardia, unspecified: Secondary | ICD-10-CM | POA: Insufficient documentation

## 2023-10-23 DIAGNOSIS — Z79899 Other long term (current) drug therapy: Secondary | ICD-10-CM | POA: Diagnosis not present

## 2023-10-23 LAB — BASIC METABOLIC PANEL
Anion gap: 10 (ref 5–15)
BUN: 21 mg/dL (ref 8–23)
CO2: 28 mmol/L (ref 22–32)
Calcium: 10.6 mg/dL — ABNORMAL HIGH (ref 8.9–10.3)
Chloride: 101 mmol/L (ref 98–111)
Creatinine, Ser: 0.84 mg/dL (ref 0.44–1.00)
GFR, Estimated: >60 mL/min (ref >60)
Glucose, Bld: 120 mg/dL — ABNORMAL HIGH (ref 70–99)
Potassium: 4.4 mmol/L (ref 3.5–5.1)
Sodium: 139 mmol/L (ref 135–145)

## 2023-10-23 LAB — MAGNESIUM: Magnesium: 2.3 mg/dL (ref 1.7–2.4)

## 2023-10-23 NOTE — Progress Notes (Signed)
Primary Care Physician: Babs Sciara, MD Primary Cardiologist: Dina Rich, MD Electrophysiologist: Maurice Small, MD     Referring Physician: Dr. Odie Sera ADALYNN CORNE is a 72 y.o. female with a history of GERD, SVT, junctional tachycardia, and atrial flutter who presents for consultation in the [facility].  History of junctional rhythm resolved off of flecainide. Recent ED visits for atrial flutter with RVR and possibly SVT; seen again for same on 9/14 and admitted for Tikosyn load. Discharged on Tikosyn 250 mcg BID. Patient is on Xarelto 15 mg daily for a CHADS2VASC score of 2.  On evaluation today, patient is currently in NSR. She did not require cardioversion during Tikosyn load admission. The patient was monitored until discharge on telemetry post conversion maintaining SB 40's-50s with some intermittent junctional rhythm, her junctional rates reaching 60's-70's, all with same narrow QRS morphology. This is known for her and asymptomatic. She has not missed any doses of Tikosyn or Xarelto. She feels well overall.   On follow up 10/23/23, she is currently in NSR. She feels well overall and has had no episodes of Afib since last office visit. No missed doses of Tikosyn or Xarelto.   Today, she denies symptoms of palpitations, chest pain, shortness of breath, orthopnea, PND, lower extremity edema, dizziness, presyncope, syncope, snoring, daytime somnolence, bleeding, or neurologic sequela. The patient is tolerating medications without difficulties and is otherwise without complaint today.    she has a BMI of Body mass index is 18.54 kg/m.Marland Kitchen Filed Weights   10/23/23 1324  Weight: 49 kg     Current Outpatient Medications  Medication Sig Dispense Refill   Ascorbic Acid (VITAMIN C PO) Take 500 mg by mouth in the morning and at bedtime.     denosumab (PROLIA) 60 MG/ML SOSY injection Inject 60 mg into the skin every 6 (six) months.      dofetilide (TIKOSYN) 250 MCG capsule Take 1 capsule (250 mcg total) by mouth 2 (two) times daily. 180 capsule 1   Flaxseed, Linseed, (FLAXSEED OIL) 1200 MG CAPS Take 1,200 mg by mouth 2 (two) times daily.     metoprolol tartrate (LOPRESSOR) 25 MG tablet Take 1 tablet (25 mg total) by mouth 2 (two) times daily as needed (Palpitations and elevated heart rate). 30 tablet 0   Multiple Vitamins-Minerals (MULTIVITAMIN WITH MINERALS) tablet Take 1 tablet by mouth daily.     potassium chloride (KLOR-CON M) 10 MEQ tablet Take 1 tablet (10 mEq total) by mouth daily. 30 tablet 5   predniSONE (DELTASONE) 10 MG tablet 1 tab tid for 2 days, then 1 tab bid for 5 days, then 1 a day until complete     Propylene Glycol (SYSTANE BALANCE) 0.6 % SOLN Place 2 drops into both eyes in the morning and at bedtime. Into both eyes     sertraline (ZOLOFT) 50 MG tablet 1 by mouth PO QD (Patient taking differently: Take 50 mg by mouth daily.) 90 tablet 3   XARELTO 15 MG TABS tablet TAKE ONE (1) TABLET BY MOUTH EVERY DAY (Patient taking differently: Take 15 mg by mouth daily.) 90 tablet 1   No current facility-administered medications for this encounter.    Atrial Fibrillation Management history:  Previous antiarrhythmic drugs: flecainide, tikosyn Previous cardioversions: none Previous ablations: 03/15/23 Anticoagulation history: Xarelto 15   ROS- All systems are reviewed and negative except as per the HPI above.  Physical Exam: BP (!) 158/76   Pulse Marland Kitchen)  43   Ht 5\' 4"  (1.626 m)   Wt 49 kg   BMI 18.54 kg/m   GEN- The patient is well appearing, alert and oriented x 3 today.   Neck - no JVD or carotid bruit noted Lungs- Clear to ausculation bilaterally, normal work of breathing Heart- Regular bradycardic rate and rhythm, no murmurs, rubs or gallops, PMI not laterally displaced Extremities- no clubbing, cyanosis, or edema Skin - no rash or ecchymosis noted   EKG today demonstrates  Vent. rate 43 BPM PR interval  134 ms QRS duration 90 ms QT/QTcB 504/425 ms P-R-T axes 90 66 62 Marked sinus bradycardia Anteroseptal infarct , age undetermined Abnormal ECG When compared with ECG of 13-Sep-2023 09:18, PREVIOUS ECG IS PRESENT  Echo 09/04/23 demonstrated   1. Intermittent junctional rhythm.   2. Left ventricular ejection fraction, by estimation, is 60 to 65%. The  left ventricle has normal function. The left ventricle has no regional  wall motion abnormalities. Left ventricular diastolic parameters are  indeterminate.   3. Right ventricular systolic function is normal. The right ventricular  size is normal. There is normal pulmonary artery systolic pressure.   4. A small pericardial effusion is present.   5. The mitral valve is normal in structure. Trivial mitral valve  regurgitation. No evidence of mitral stenosis.   6. Tricuspid valve regurgitation is moderate.   7. The aortic valve is tricuspid. Aortic valve regurgitation is not  visualized. Aortic valve sclerosis/calcification is present, without any  evidence of aortic stenosis.   8. The inferior vena cava is dilated in size with >50% respiratory  variability, suggesting right atrial pressure of 8 mmHg.    ASSESSMENT & PLAN CHA2DS2-VASc Score = 2  The patient's score is based upon: CHF History: 0 HTN History: 0 Diabetes History: 0 Stroke History: 0 Vascular Disease History: 0 Age Score: 1 Gender Score: 1       ASSESSMENT AND PLAN: Paroxysmal Atrial Flutter The patient's CHA2DS2-VASc score is 2, indicating a 2.2% annual risk of stroke.   S/p Atrial flutter ablation on 03/15/23.  S/p Tikosyn admission 9/14-18/24.  She is currently in sinus bradycardia and she feels well overall. Noted low HR apparently similar to previous for patient.  Qtc stable. Continue Tikosyn 250 mcg BID. Bmet and mag drawn today.   Follow up 3 months for Tikosyn surveillance.    Lake Bells, PA-C  [facility] [address] [place], [place] [address] [phone]

## 2023-11-06 ENCOUNTER — Ambulatory Visit: Payer: Medicare Other | Attending: Cardiovascular Disease | Admitting: Cardiovascular Disease

## 2023-11-06 ENCOUNTER — Encounter: Payer: Self-pay | Admitting: Cardiovascular Disease

## 2023-11-06 VITALS — BP 144/80 | HR 48 | Ht 64.0 in | Wt 106.6 lb

## 2023-11-06 DIAGNOSIS — I4891 Unspecified atrial fibrillation: Secondary | ICD-10-CM

## 2023-11-06 DIAGNOSIS — M25562 Pain in left knee: Secondary | ICD-10-CM | POA: Diagnosis not present

## 2023-11-06 NOTE — Progress Notes (Signed)
Electrophysiology Office Note:    Date:  11/06/2023   ID:  Pamela Yang, DOB 11-07-1951, MRN [id number]  PCP:  Babs Sciara, MD   [facility] Providers Cardiologist:  Dina Rich, MD Electrophysiologist:  Maurice Small, MD     Referring MD: Babs Sciara, MD   History of Present Illness:    Pamela Yang is a 72 y.o. female with a hx listed below, significant for atrial fibrillation, referred for arrhythmia management.  She was diagnosed with AF in June, 2021 and managed with flecainide. I personally reviewed the ECGs from this episode (05/22/2022), and it appears to be atrial flutter to me. She was very symptomatic with palpitations during this episode.  She is recently seen by [place] and noted to be in a junctional rhythm.  Flecainide was discontinued and a monitor placed.  The monitor showed that she did continue to have rhythms that it interpreted as junctional, though all but one strip is uninterpretable due to artifact. These episodes were associated with a pounding sensation in her chest.  she did have a recurrence of atrial flutter in February resulting in tachycardia with 2-1 conduction.  Metoprolol was prescribed as needed.  She underwent ablation of typical atrial flutter in March 2024.  She presented in sinus rhythm but did convert to a typical atrial flutter during the case and it was successfully ablated  She has since had atrial fibrillation and atrial flutter. Tikosyn was started and she has since been doing great and not had any symptoms of recurrence.   EKGs/Labs/Other Studies Reviewed Today:     Zio patch strips: independently reviewed by me:  Sinus rhythm, HR 39-112 bpm, avg 50 Symptoms of fluttering, skipped beats, lightheadedness were associated with accelerated junctional rhythm -- difficult to determine due to artifact and poor quality. There were episodes of SVT with long-RP, likely atrial tachycardia  EKG:  Last EKG  results: today - sinus bradycardia, anteroseptal MI   Recent Labs: 09/02/2023: ALT 31; TSH 1.296 09/03/2023: Hemoglobin 12.5; Platelets 163 10/23/2023: BUN 21; Creatinine, Ser 0.84; Magnesium 2.3; Potassium 4.4; Sodium 139     Physical Exam:    VS:  BP (!) 144/80   Pulse (!) 48   Ht 5\' 4"  (1.626 m)   Wt 106 lb 9.6 oz (48.4 kg)   BMI 18.30 kg/m     Wt Readings from Last 3 Encounters:  11/06/23 106 lb 9.6 oz (48.4 kg)  10/23/23 108 lb (49 kg)  09/13/23 107 lb 3.2 oz (48.6 kg)     GEN:  Well nourished, well developed in no acute distress CARDIAC: brady, regular, no murmurs, rubs, gallops RESPIRATORY:  Normal work of breathing MUSCULOSKELETAL: no edema    ASSESSMENT & PLAN:    Atrial flutter and fibrillation:  She had recurrence of atrial flutter after flecainide was discontinued Status post ablation of flutter March 15, 2023 Tikosyn was started for fibrillation and SVT Recent labs reviewed, EKG reviewed.  Will continue Tikosyn  Secondary hypercoagulable state Will continue to monitor for atrial fibrillation and recurrence of flutter over the next 6 months. If she returns in follow-up without have any evidence of recurrence, I think the risk/benefit favors discontinuation of Xarelto  Junctional rhythms:  Asymptomatic  She has a history of sinus bradycardia; was a former distance runner         Medication Adjustments/Labs and Tests Ordered: Current medicines are reviewed at length with the patient today.  Concerns regarding medicines are outlined  above.  Orders Placed This Encounter  Procedures   EKG 12-Lead   EKG 12-Lead   No orders of the defined types were placed in this encounter.    Signed, Maurice Small, MD  11/06/2023 12:39 PM    [facility]

## 2023-11-06 NOTE — Patient Instructions (Signed)

## 2023-11-09 DIAGNOSIS — K080 Exfoliation of teeth due to systemic causes: Secondary | ICD-10-CM | POA: Diagnosis not present

## 2023-11-21 IMAGING — MG MM DIGITAL SCREENING BILAT W/ TOMO AND CAD
6 of 10 series · 6 of 30 positions shown · non-contrast
Comparison: Previous exam(s).

CLINICAL DATA: Screening.

EXAM:
DIGITAL SCREENING BILATERAL MAMMOGRAM WITH TOMOSYNTHESIS AND CAD
TECHNIQUE: Bilateral screening digital craniocaudal and mediolateral oblique
mammograms were obtained. Bilateral screening digital breast
tomosynthesis was performed. The images were evaluated with
computer-aided detection.

[R CC synth-2D (1 of 2)]
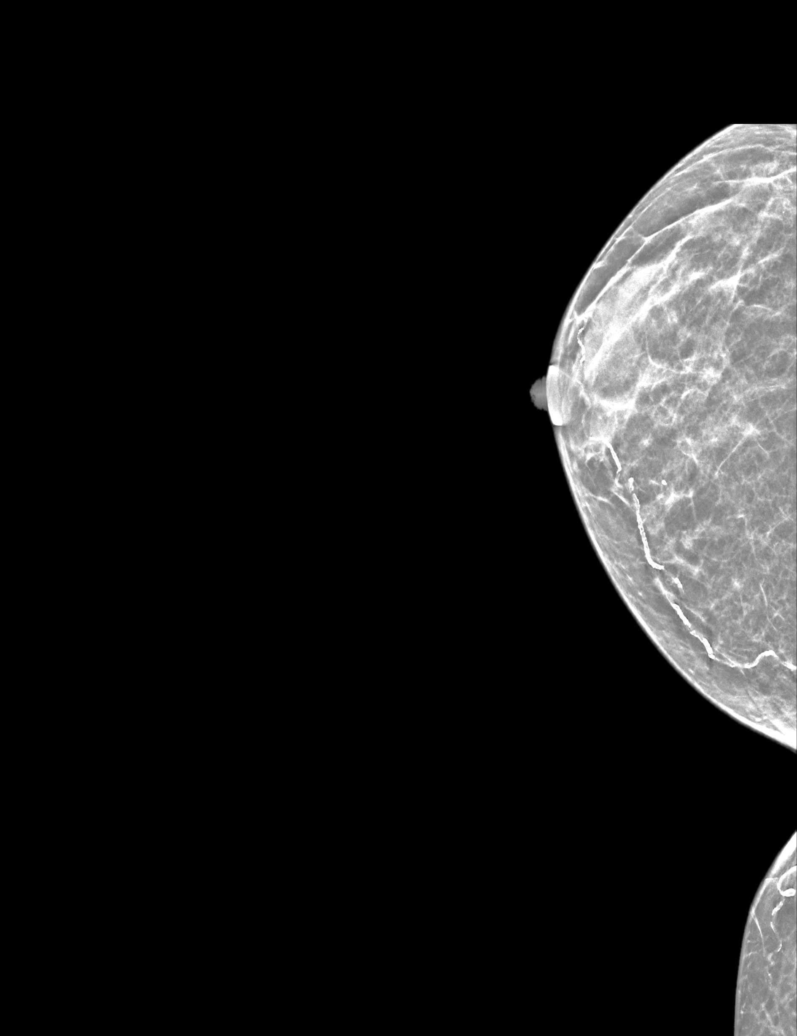

[R MLO synth-2D]
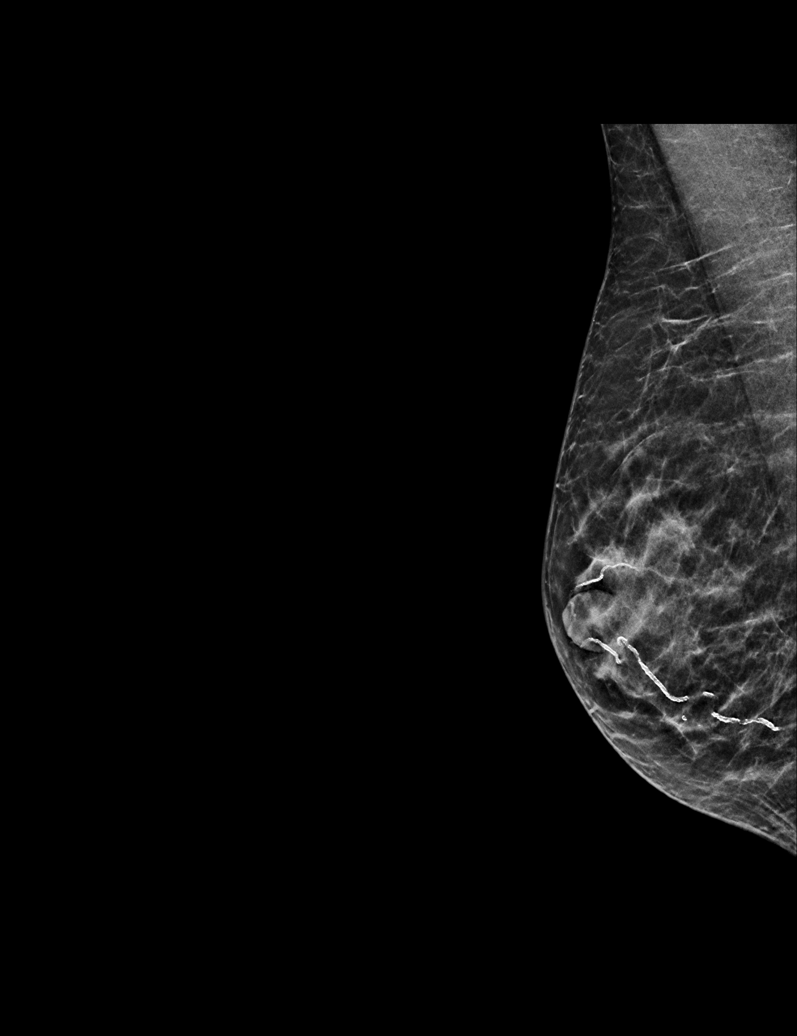

[L CC synth-2D]
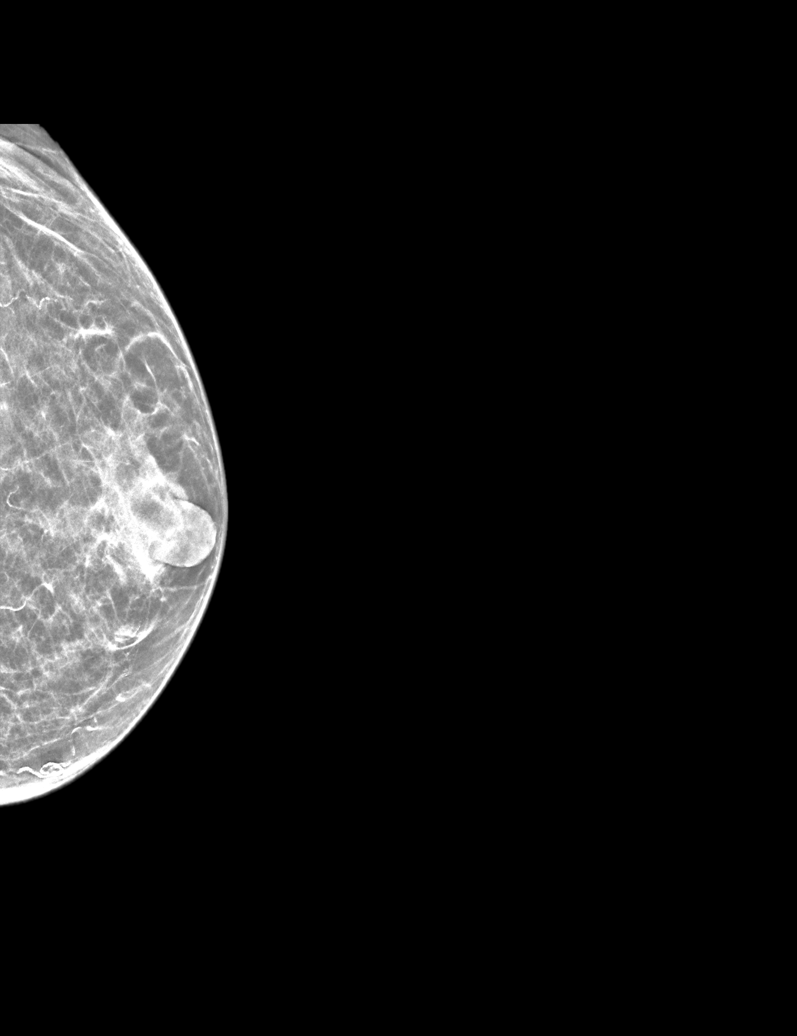

[L MLO synth-2D]
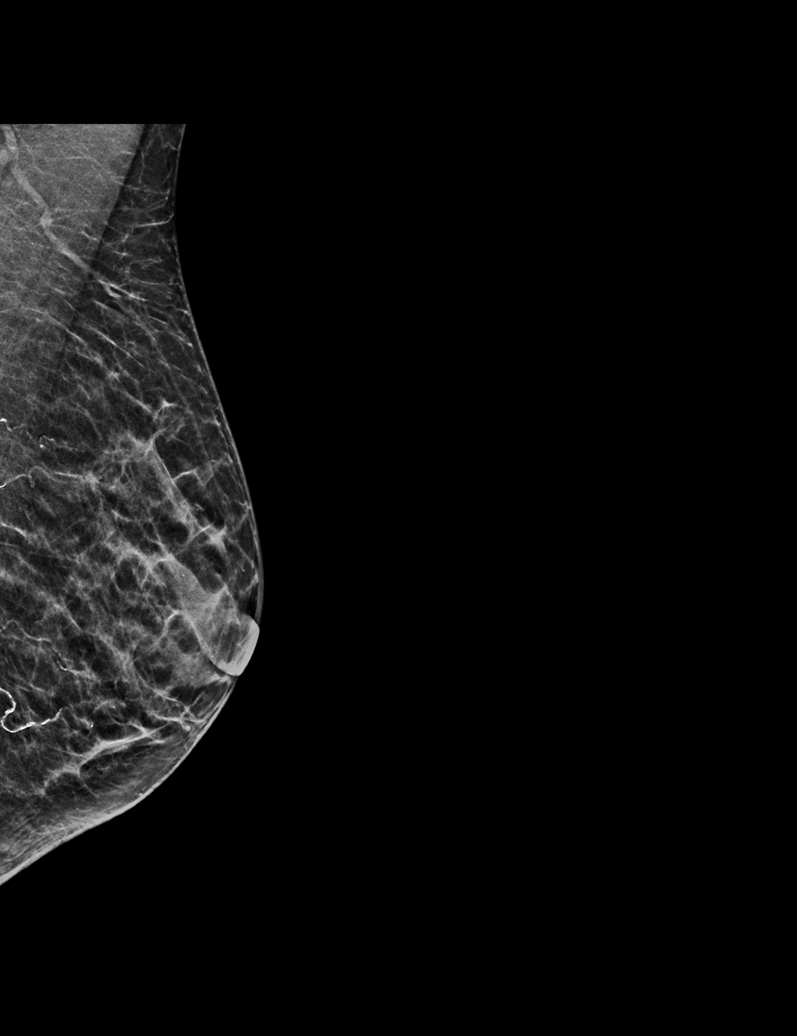

[R CC synth-2D (2 of 2)]
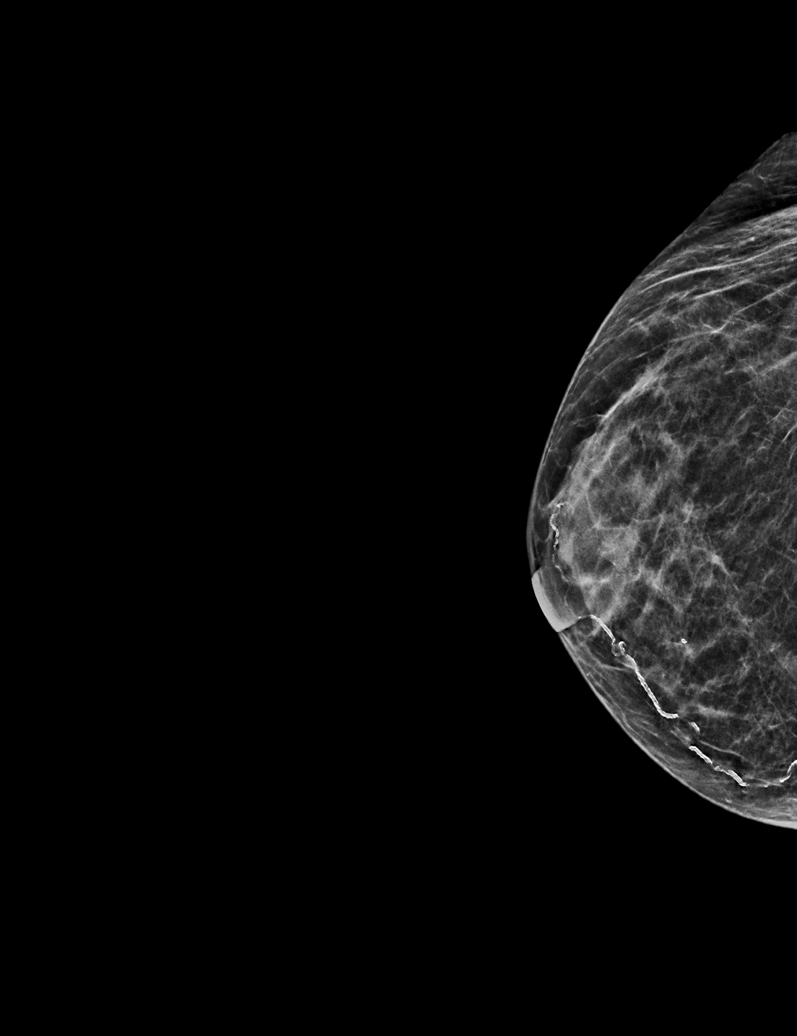

[R CC tomo · tomo slice 19/36.0]
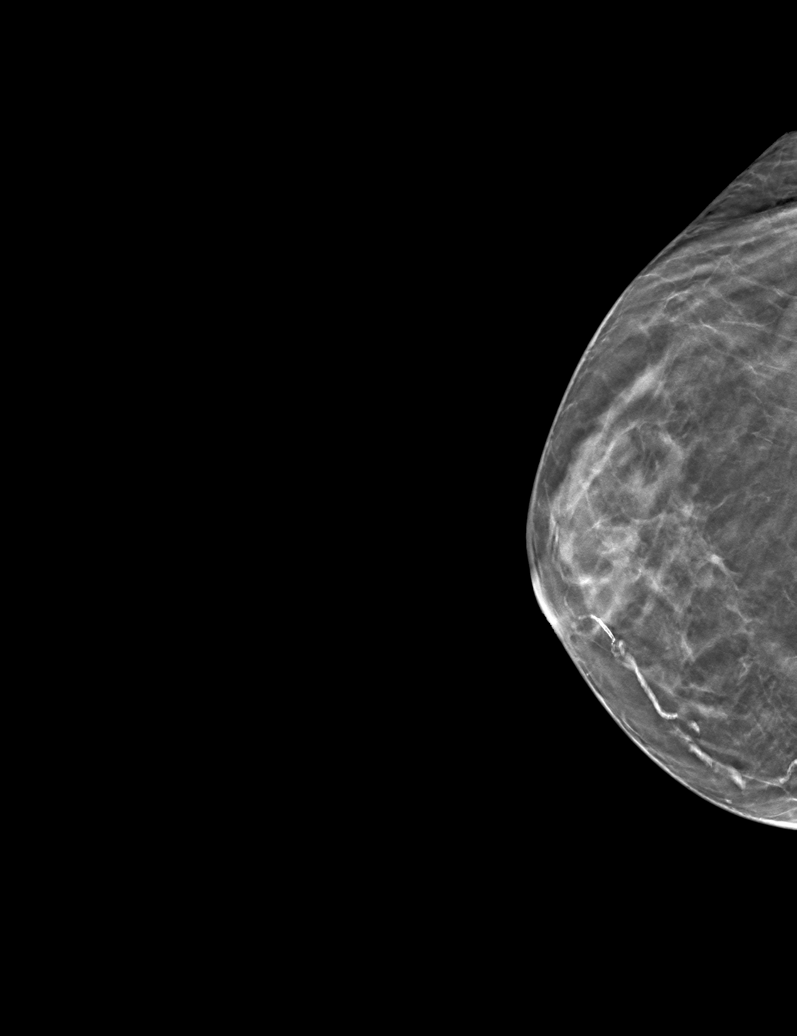

[6 of 30 positions shown; findings below may reference images not displayed]

ACR Breast Density Category c: The breast tissue is heterogeneously
dense, which may obscure small masses.
FINDINGS: There are no findings suspicious for malignancy.
IMPRESSION: No mammographic evidence of malignancy. A result letter of this
screening mammogram will be mailed directly to the patient.

RECOMMENDATION:
Screening mammogram in one year. (Code:Q3-W-BC3)

BI-RADS CATEGORY  1: Negative.

## 2023-11-23 DIAGNOSIS — M25562 Pain in left knee: Secondary | ICD-10-CM | POA: Diagnosis not present

## 2023-11-23 DIAGNOSIS — M79605 Pain in left leg: Secondary | ICD-10-CM | POA: Diagnosis not present

## 2023-11-28 ENCOUNTER — Other Ambulatory Visit: Payer: Self-pay | Admitting: Cardiology

## 2023-11-28 DIAGNOSIS — I483 Typical atrial flutter: Secondary | ICD-10-CM

## 2023-11-28 DIAGNOSIS — I480 Paroxysmal atrial fibrillation: Secondary | ICD-10-CM

## 2023-11-29 NOTE — Telephone Encounter (Signed)
Prescription refill request for Xarelto received.  Indication: AF Last office visit: 11/06/23  A Mealor MD Weight: 48.4kg Age: 72 Scr: 0.84 on 10/23/23  Epic CrCl: 44.89  Based on above findings Xarelto 15mg  daily is the appropriate dose.  Refill approved.

## 2023-12-28 DIAGNOSIS — M25562 Pain in left knee: Secondary | ICD-10-CM | POA: Diagnosis not present

## 2024-01-05 ENCOUNTER — Ambulatory Visit (INDEPENDENT_AMBULATORY_CARE_PROVIDER_SITE_OTHER): Payer: Medicare Other

## 2024-01-05 VITALS — Ht 64.0 in | Wt 106.0 lb

## 2024-01-05 DIAGNOSIS — Z0000 Encounter for general adult medical examination without abnormal findings: Secondary | ICD-10-CM

## 2024-01-05 NOTE — Progress Notes (Signed)
Please attest and cosign this visit due to patients primary care provider not being in the office at the time the visit was completed.  Because this visit was a virtual/telehealth visit,  certain criteria was not obtained, such a blood pressure, CBG if applicable, and timed get up and go. Any medications not marked as "taking" were not mentioned during the medication reconciliation part of the visit. Any vitals not documented were not able to be obtained due to this being a telehealth visit or patient was unable to self-report a recent blood pressure reading due to a lack of equipment at home via telehealth. Vitals that have been documented are verbally provided by the patient.  Interactive audio and video telecommunications were attempted between this provider and patient, however failed, due to patient having technical difficulties OR patient did not have access to video capability.  We continued and completed visit with audio only.  Subjective:   Pamela Yang is a 73 y.o. female who presents for Medicare Annual (Subsequent) preventive examination.  Visit Complete: Virtual I connected with  Pamela Yang on 01/05/24 by a audio enabled telemedicine application and verified that I am speaking with the correct person using two identifiers.  Patient Location: Home  Provider Location: Office/Clinic  I discussed the limitations of evaluation and management by telemedicine. The patient expressed understanding and agreed to proceed.  Vital Signs: Because this visit was a virtual/telehealth visit, some criteria may be missing or patient reported. Any vitals not documented were not able to be obtained and vitals that have been documented are patient reported.  Patient Medicare AWV questionnaire was completed by the patient on 01/01/2024; I have confirmed that all information answered by patient is correct and no changes since this date.  Cardiac Risk Factors include: advanced age (>29men, >37  women);Other (see comment), Risk factor comments: AFib and SVT     Objective:    Today's Vitals   01/01/24 1216 01/05/24 1358  Weight:  106 lb (48.1 kg)  Height:  5\' 4"  (1.626 m)  PainSc: 6     Body mass index is 18.19 kg/m.     01/05/2024    1:58 PM 09/03/2023    4:00 PM 09/02/2023   11:48 AM 08/31/2023    7:33 PM 08/24/2023    7:53 PM 02/06/2023    1:11 AM 12/30/2022   10:41 AM  Advanced Directives  Does Patient Have a Medical Advance Directive? No No No No No No Yes  Type of Advance Directive       Living will;Healthcare Power of Attorney  Does patient want to make changes to medical advance directive?       No - Patient declined  Copy of Healthcare Power of Attorney in Chart?       No - copy requested  Would patient like information on creating a medical advance directive? No - Patient declined No - Patient declined   No - Patient declined      Current Medications (verified) Outpatient Encounter Medications as of 01/05/2024  Medication Sig   Ascorbic Acid (VITAMIN C PO) Take 500 mg by mouth in the morning and at bedtime.   denosumab (PROLIA) 60 MG/ML SOSY injection Inject 60 mg into the skin every 6 (six) months.   dofetilide (TIKOSYN) 250 MCG capsule Take 1 capsule (250 mcg total) by mouth 2 (two) times daily.   Flaxseed, Linseed, (FLAXSEED OIL) 1200 MG CAPS Take 1,200 mg by mouth 2 (two) times daily.   metoprolol tartrate (  LOPRESSOR) 25 MG tablet Take 1 tablet (25 mg total) by mouth 2 (two) times daily as needed (Palpitations and elevated heart rate).   Multiple Vitamins-Minerals (MULTIVITAMIN WITH MINERALS) tablet Take 1 tablet by mouth daily.   potassium chloride (KLOR-CON M) 10 MEQ tablet Take 1 tablet (10 mEq total) by mouth daily.   Propylene Glycol (SYSTANE BALANCE) 0.6 % SOLN Place 2 drops into both eyes in the morning and at bedtime. Into both eyes   sertraline (ZOLOFT) 50 MG tablet 1 by mouth PO QD (Patient taking differently: Take 50 mg by mouth daily.)   XARELTO 15  MG TABS tablet TAKE ONE (1) TABLET BY MOUTH EVERY DAY   No facility-administered encounter medications on file as of 01/05/2024.    Allergies (verified) Patient has no known allergies.   History: Past Medical History:  Diagnosis Date   Atrial fibrillation/flutter    s/p RF ablation for AFlutter 02/2023   Depression 2008   GAD (generalized anxiety disorder)    Gastroparesis 04/2007   Dr. Cira Servant   GERD (gastroesophageal reflux disease)    Hearing loss    Junctional rhythm    Osteoporosis    SVT (supraventricular tachycardia) (HCC) 08/31/2023   Past Surgical History:  Procedure Laterality Date   A-FLUTTER ABLATION N/A 03/15/2023   Procedure: A-FLUTTER ABLATION;  Surgeon: Maurice Small, MD;  Location: [facility];  Service: Cardiovascular;  Laterality: N/A;   COLONOSCOPY  05/20/2011   Dr Karilyn Cota   COLONOSCOPY N/A 07/08/2021   Procedure: COLONOSCOPY;  Surgeon: Malissa Hippo, MD;  Location: [facility];  Service: Endoscopy;  Laterality: N/A;  10:55   EYE SURGERY  2015   Cataract removal   TONSILLECTOMY     Family History  Problem Relation Age of Onset   Heart attack Mother    Arthritis Mother    Heart disease Mother    Heart disease Father    Ovarian cancer Sister    Cancer Sister    Hypertension Sister    Kidney disease Sister    Hypertension Sister    Hypertension Sister    Osteoporosis Sister    Coronary artery disease Brother    Depression Sister    Hypertension Sister    Heart disease Brother    Hypertension Brother    Social History   Socioeconomic History   Marital status: Widowed    Spouse name: Not on file   Number of children: 2   Years of education: Not on file   Highest education level: Not on file  Occupational History   Not on file  Tobacco Use   Smoking status: Former    Current packs/day: 0.00    Average packs/day: 0.3 packs/day for 5.0 years (1.3 ttl pk-yrs)    Types: Cigarettes    Quit date: 12/20/1979    Years since  quitting: 44.0   Smokeless tobacco: Never  Vaping Use   Vaping status: Never Used  Substance and Sexual Activity   Alcohol use: Yes    Alcohol/week: 7.0 standard drinks of alcohol    Comment: occ   Drug use: Never   Sexual activity: Not Currently    Birth control/protection: Post-menopausal  Other Topics Concern   Not on file  Social History Narrative   1 daughter living. 1 daughter died at age 36.   No grandchildren.   Husband passed 06/2021. Married x 24 years.   Social Drivers of Health   Financial Resource Strain: Low Risk  (01/05/2024)  Overall Financial Resource Strain (CARDIA)    Difficulty of Paying Living Expenses: Not hard at all  Food Insecurity: No Food Insecurity (01/05/2024)   Hunger Vital Sign    Worried About Running Out of Food in the Last Year: Never true    Ran Out of Food in the Last Year: Never true  Transportation Needs: No Transportation Needs (01/05/2024)   PRAPARE - Administrator, Civil Service (Medical): No    Lack of Transportation (Non-Medical): No  Physical Activity: Sufficiently Active (01/05/2024)   Exercise Vital Sign    Days of Exercise per Week: 7 days    Minutes of Exercise per Session: 150+ min  Stress: No Stress Concern Present (01/05/2024)   [employer] of Occupational Health - Occupational Stress Questionnaire    Feeling of Stress : Not at all  Social Connections: Moderately Integrated (01/05/2024)   Social Connection and Isolation Panel [NHANES]    Frequency of Communication with Friends and Family: More than three times a week    Frequency of Social Gatherings with Friends and Family: More than three times a week    Attends Religious Services: More than 4 times per year    Active Member of [employer] or Organizations: Yes    Attends Banker Meetings: More than 4 times per year    Marital Status: Widowed    Tobacco Counseling Counseling given: Yes   Clinical Intake:  Pre-visit preparation completed:  Yes  Pain : 0-10 Pain Score: 6  Pain Type: Chronic pain Pain Location: Knee Pain Orientation: Left Pain Descriptors / Indicators: Dull, Aching Pain Onset: More than a month ago Pain Frequency: Intermittent     BMI - recorded: 18.19 Nutritional Status: BMI <19  Underweight Nutritional Risks: None Diabetes: No  How often do you need to have someone help you when you read instructions, pamphlets, or other written materials from your doctor or pharmacy?: 1 - Never  Interpreter Needed?: No  Information entered by :: Deloria Lair W CMA   Activities of Daily Living    01/01/2024   12:16 PM 09/03/2023    4:00 PM  In your present state of health, do you have any difficulty performing the following activities:  Hearing? 1 0  Comment wears hearing aids   Vision? 0 0  Difficulty concentrating or making decisions? 0 0  Walking or climbing stairs? 0 0  Dressing or bathing? 0 0  Doing errands, shopping? 0 0  Preparing Food and eating ? N   Using the Toilet? N   In the past six months, have you accidently leaked urine? N   Do you have problems with loss of bowel control? N   Managing your Medications? N   Managing your Finances? N   Housekeeping or managing your Housekeeping? N     Patient Care Team: Babs Sciara, MD as PCP - General (Family Medicine) Branch, Dorothe Pea, MD as PCP - Cardiology (Cardiology) Mealor, Roberts Gaudy, MD as PCP - Electrophysiology (Cardiology) Babs Sciara, MD as Consulting Physician (Family Medicine) Dorisann Frames, MD as Referring Physician (Endocrinology)  Indicate any recent Medical Services you may have received from other than [facility] providers in the past year (date may be approximate).     Assessment:   This is a routine wellness examination for Charizma.  Hearing/Vision screen Hearing Screening - Comments:: Patient wears hearing aids and she is utd on yearly exams with provider.   Vision Screening - Comments:: Wears rx glasses - up to  date with  routine eye exams  Sees Dr. Marchelle Gearing in [place]   Goals Addressed             This Visit's Progress    Patient Stated       I [facility] to [facility]       Depression Screen    01/05/2024    2:02 PM 01/27/2023    9:59 AM 12/30/2022   10:47 AM 03/28/2022    2:09 PM 01/21/2022    2:21 PM 12/21/2021   10:34 AM 05/03/2021    3:35 PM  PHQ 2/9 Scores  PHQ - 2 Score 0 0 0 0 0 1 0  PHQ- 9 Score 0    0  2    Fall Risk    01/01/2024   12:16 PM 01/27/2023    9:58 AM 12/30/2022   10:47 AM 12/26/2022    9:44 AM 03/28/2022    2:09 PM  Fall Risk   Falls in the past year? 0 0 0 0 0  Number falls in past yr: 0 0 0 0 0  Injury with Fall? 0 0 0 0 0  Risk for fall due to : No Fall Risks  No Fall Risks  No Fall Risks  Follow up Falls prevention discussed  Falls evaluation completed;Education provided;Falls prevention discussed  Falls evaluation completed    MEDICARE RISK AT HOME: Medicare Risk at Home Any stairs in or around the home?: (Patient-Rptd) Yes If so, are there any without handrails?: (Patient-Rptd) No Home free of loose throw rugs in walkways, pet beds, electrical cords, etc?: (Patient-Rptd) No Adequate lighting in your home to reduce risk of falls?: (Patient-Rptd) Yes Life alert?: (Patient-Rptd) No Use of a cane, walker or w/c?: (Patient-Rptd) No Grab bars in the bathroom?: (Patient-Rptd) Yes Shower chair or bench in shower?: (Patient-Rptd) Yes Elevated toilet seat or a handicapped toilet?: (Patient-Rptd) Yes  TIMED UP AND GO:  Was the test performed?  No    Cognitive Function:        01/05/2024    2:01 PM 12/30/2022   10:48 AM 12/21/2021   10:42 AM  6CIT Screen  What Year? 0 points 0 points 0 points  What month? 0 points 0 points 0 points  What time? 0 points 0 points 0 points  Count back from 20 0 points 0 points 0 points  Months in reverse 0 points 0 points 0 points  Repeat phrase 0 points 0 points 0 points  Total Score 0 points 0 points  0 points    Immunizations Immunization History  Administered Date(s) Administered   Fluad Quad(high Dose 65+) 09/04/2020   Influenza,inj,Quad PF,6+ Mos 08/25/2016, 08/30/2017   Influenza,trivalent, recombinat, inj, PF 09/29/2015   Influenza-Unspecified 08/28/2018, 09/25/2019, 09/18/2021, 09/27/2022   Moderna Covid-19 Fall Seasonal Vaccine 25yrs & older 09/15/2023   Moderna SARS-COV2 Booster Vaccination 10/02/2021   Moderna Sars-Covid-2 Vaccination 01/03/2020, 01/31/2020, 08/20/2020   Pneumococcal Conjugate-13 08/21/2015   Pneumococcal Polysaccharide-23 08/13/2014   Tdap 01/27/2023   Unspecified SARS-COV-2 Vaccination 09/27/2022   Zoster Recombinant(Shingrix) 09/09/2019, 02/17/2020   Zoster, Live 08/26/2014    TDAP status: Up to date  Flu Vaccine status: Up to date  Pneumococcal vaccine status: Up to date  Covid-19 vaccine status: Information provided on how to obtain vaccines.   Qualifies for Shingles Vaccine? No   Zostavax completed Yes   Shingrix Completed?: Yes  Screening Tests Health Maintenance  Topic Date Due   INFLUENZA  VACCINE  07/20/2023   DEXA SCAN  08/27/2023   COVID-19 Vaccine (6 - 2024-25 season) 11/10/2023   Medicare Annual Wellness (AWV)  12/31/2023   MAMMOGRAM  02/24/2024   DTaP/Tdap/Td (2 - Td or Tdap) 01/27/2033   Pneumonia Vaccine 69+ Years old  Completed   Hepatitis C Screening  Completed   Zoster Vaccines- Shingrix  Completed   HPV VACCINES  Aged Out   Colonoscopy  Discontinued    Health Maintenance  Health Maintenance Due  Topic Date Due   INFLUENZA VACCINE  07/20/2023   DEXA SCAN  08/27/2023   COVID-19 Vaccine (6 - 2024-25 season) 11/10/2023   Medicare Annual Wellness (AWV)  12/31/2023    Colorectal cancer screening: No longer required.   Mammogram status: Completed 02/24/2023. Repeat every year  Bone Density Status: Will request most recent dexa scan results from Dr. Talmage Nap. Patient is on Prolia  Lung Cancer Screening: (Low Dose CT  Chest recommended if Age 82-80 years, 20 pack-year currently smoking OR have quit w/in 15years.) does not qualify.   Lung Cancer Screening Referral: na  Additional Screening:  Hepatitis C Screening: does not qualify; Completed   Vision Screening: Recommended annual ophthalmology exams for early detection of glaucoma and other disorders of the eye. Is the patient up to date with their annual eye exam?  Yes  Who is the provider or what is the name of the office in which the patient attends annual eye exams? Dr. Sallye Lat If pt is not established with a provider, would they like to be referred to a provider to establish care? No .   Dental Screening: Recommended annual dental exams for proper oral hygiene  Diabetic Foot Exam: na  Community Resource Referral / Chronic Care Management: CRR required this visit?  No   CCM required this visit?  No     Plan:     I have personally reviewed and noted the following in the patient's chart:   Medical and social history Use of alcohol, tobacco or illicit drugs  Current medications and supplements including opioid prescriptions. Patient is not currently taking opioid prescriptions. Functional ability and status Nutritional status Physical activity Advanced directives List of other physicians Hospitalizations, surgeries, and ER visits in previous 12 months Vitals Screenings to include cognitive, depression, and falls Referrals and appointments  In addition, I have reviewed and discussed with patient certain preventive protocols, quality metrics, and best practice recommendations. A written personalized care plan for preventive services as well as general preventive health recommendations were provided to patient.     Jordan Hawks Mona Ayars, CMA   01/05/2024   After Visit Summary: (MyChart) Due to this being a telephonic visit, the after visit summary with patients personalized plan was offered to patient via MyChart

## 2024-01-05 NOTE — Patient Instructions (Signed)
Pamela Yang , Thank you for taking time to come for your Medicare Wellness Visit. I appreciate your ongoing commitment to your health goals. Please review the following plan we discussed and let me know if I can assist you in the future.   Referrals/Orders/Follow-Ups/Clinician Recommendations:  Next Medicare Annual Wellness Visit: January 10, 2025 at 2:20 pm virtual  I will request your bone density results from Dr. Talmage Nap     This is a list of the screening recommended for you and due dates:  Health Maintenance  Topic Date Due   DEXA scan (bone density measurement)  08/27/2023   COVID-19 Vaccine (6 - 2024-25 season) 11/10/2023   Mammogram  02/24/2024   Medicare Annual Wellness Visit  01/04/2025   DTaP/Tdap/Td vaccine (2 - Td or Tdap) 01/27/2033   Pneumonia Vaccine  Completed   Flu Shot  Completed   Hepatitis C Screening  Completed   Zoster (Shingles) Vaccine  Completed   HPV Vaccine  Aged Out   Colon Cancer Screening  Discontinued    Advanced directives: (Declined) Advance directive discussed with you today. Even though you declined this today, please call our office should you change your mind, and we can give you the proper paperwork for you to fill out.  Next Medicare Annual Wellness Visit scheduled for next year: yes  Preventive Care 49 Years and Older, Female Preventive care refers to lifestyle choices and visits with your health care provider that can promote health and wellness. Preventive care visits are also called wellness exams. What can I expect for my preventive care visit? Counseling Your health care provider may ask you questions about your: Medical history, including: Past medical problems. Family medical history. Pregnancy and menstrual history. History of falls. Current health, including: Memory and ability to understand (cognition). Emotional well-being. Home life and relationship well-being. Sexual activity and sexual health. Lifestyle,  including: Alcohol, nicotine or tobacco, and drug use. Access to firearms. Diet, exercise, and sleep habits. Work and work Astronomer. Sunscreen use. Safety issues such as seatbelt and bike helmet use. Physical exam Your health care provider will check your: Height and weight. These may be used to calculate your BMI (body mass index). BMI is a measurement that tells if you are at a healthy weight. Waist circumference. This measures the distance around your waistline. This measurement also tells if you are at a healthy weight and may help predict your risk of certain diseases, such as type 2 diabetes and high blood pressure. Heart rate and blood pressure. Body temperature. Skin for abnormal spots. What immunizations do I need?  Vaccines are usually given at various ages, according to a schedule. Your health care provider will recommend vaccines for you based on your age, medical history, and lifestyle or other factors, such as travel or where you work. What tests do I need? Screening Your health care provider may recommend screening tests for certain conditions. This may include: Lipid and cholesterol levels. Hepatitis C test. Hepatitis B test. HIV (human immunodeficiency virus) test. STI (sexually transmitted infection) testing, if you are at risk. Lung cancer screening. Colorectal cancer screening. Diabetes screening. This is done by checking your blood sugar (glucose) after you have not eaten for a while (fasting). Mammogram. Talk with your health care provider about how often you should have regular mammograms. BRCA-related cancer screening. This may be done if you have a family history of breast, ovarian, tubal, or peritoneal cancers. Bone density scan. This is done to screen for osteoporosis. Talk with your  health care provider about your test results, treatment options, and if necessary, the need for more tests. Follow these instructions at home: Eating and drinking  Eat a  diet that includes fresh fruits and vegetables, whole grains, lean protein, and low-fat dairy products. Limit your intake of foods with high amounts of sugar, saturated fats, and salt. Take vitamin and mineral supplements as recommended by your health care provider. Do not drink alcohol if your health care provider tells you not to drink. If you drink alcohol: Limit how much you have to 0-1 drink a day. Know how much alcohol is in your drink. In the [place] one drink equals one 12 oz bottle of beer (355 mL), one 5 oz glass of wine (148 mL), or one 1 oz glass of hard liquor (44 mL). Lifestyle Brush your teeth every morning and night with fluoride toothpaste. Floss one time each day. Exercise for at least 30 minutes 5 or more days each week. Do not use any products that contain nicotine or tobacco. These products include cigarettes, chewing tobacco, and vaping devices, such as e-cigarettes. If you need help quitting, ask your health care provider. Do not use drugs. If you are sexually active, practice safe sex. Use a condom or other form of protection in order to prevent STIs. Take aspirin only as told by your health care provider. Make sure that you understand how much to take and what form to take. Work with your health care provider to find out whether it is safe and beneficial for you to take aspirin daily. Ask your health care provider if you need to take a cholesterol-lowering medicine (statin). Find healthy ways to manage stress, such as: Meditation, yoga, or listening to music. Journaling. Talking to a trusted person. Spending time with friends and family. Minimize exposure to UV radiation to reduce your risk of skin cancer. Safety Always wear your seat belt while driving or riding in a vehicle. Do not drive: If you have been drinking alcohol. Do not ride with someone who has been drinking. When you are tired or distracted. While texting. If you have been using any mind-altering  substances or drugs. Wear a helmet and other protective equipment during sports activities. If you have firearms in your house, make sure you follow all gun safety procedures. What's next? Visit your health care provider once a year for an annual wellness visit. Ask your health care provider how often you should have your eyes and teeth checked. Stay up to date on all vaccines. This information is not intended to replace advice given to you by your health care provider. Make sure you discuss any questions you have with your health care provider. Document Revised: 06/02/2021 Document Reviewed: 06/02/2021 Elsevier Patient Education  2024 [employer].  Understanding Your Risk for Falls Millions of people have serious injuries from falls each year. It is important to understand your risk of falling. Talk with your health care provider about your risk and what you can do to lower it. If you do have a serious fall, make sure to tell your provider. Falling once raises your risk of falling again. How can falls affect me? Serious injuries from falls are common. These include: Broken bones, such as hip fractures. Head injuries, such as traumatic brain injuries (TBI) or concussions. A fear of falling can cause you to avoid activities and stay at home. This can make your muscles weaker and raise your risk for a fall. What can increase my risk? There are  a number of risk factors that increase your risk for falling. The more risk factors you have, the higher your risk of falling. Serious injuries from a fall happen most often to people who are older than 75 years old. Teenagers and young adults ages 10-29 are also at higher risk. Common risk factors include: Weakness in the lower body. Being generally weak or confused due to long-term (chronic) illness. Dizziness or balance problems. Poor vision. Medicines that cause dizziness or drowsiness. These may include: Medicines for your blood pressure, heart,  anxiety, insomnia, or swelling (edema). Pain medicines. Muscle relaxants. Other risk factors include: Drinking alcohol. Having had a fall in the past. Having foot pain or wearing improper footwear. Working at a dangerous job. Having any of the following in your home: Tripping hazards, such as floor clutter or loose rugs. Poor lighting. Pets. Having dementia or memory loss. What actions can I take to lower my risk of falling?     Physical activity Stay physically fit. Do strength and balance exercises. Consider taking a regular class to build strength and balance. Yoga and tai chi are good options. Vision Have your eyes checked every year and your prescription for glasses or contacts updated as needed. Shoes and walking aids Wear non-skid shoes. Wear shoes that have rubber soles and low heels. Do not wear high heels. Do not walk around the house in socks or slippers. Use a cane or walker as told by your provider. Home safety Attach secure railings on both sides of your stairs. Install grab bars for your bathtub, shower, and toilet. Use a non-skid mat in your bathtub or shower. Attach bath mats securely with double-sided, non-slip rug tape. Use good lighting in all rooms. Keep a flashlight near your bed. Make sure there is a clear path from your bed to the bathroom. Use night-lights. Do not use throw rugs. Make sure all carpeting is taped or tacked down securely. Remove all clutter from walkways and stairways, including extension cords. Repair uneven or broken steps and floors. Avoid walking on icy or slippery surfaces. Walk on the grass instead of on icy or slick sidewalks. Use ice melter to get rid of ice on walkways in the winter. Use a cordless phone. Questions to ask your health care provider Can you help me check my risk for a fall? Do any of my medicines make me more likely to fall? Should I take a vitamin D supplement? What exercises can I do to improve my strength and  balance? Should I make an appointment to have my vision checked? Do I need a bone density test to check for weak bones (osteoporosis)? Would it help to use a cane or a walker? Where to find more information Centers for Disease Control and Prevention, STEADI: TonerPromos.no Community-Based Fall Prevention Programs: TonerPromos.no [employer] on Aging: BaseRingTones.pl Contact a health care provider if: You fall at home. You are afraid of falling at home. You feel weak, drowsy, or dizzy. This information is not intended to replace advice given to you by your health care provider. Make sure you discuss any questions you have with your health care provider. Document Revised: 08/08/2022 Document Reviewed: 08/08/2022 Elsevier Patient Education  2024 [employer].

## 2024-01-11 ENCOUNTER — Encounter: Payer: Self-pay | Admitting: Nurse Practitioner

## 2024-01-12 ENCOUNTER — Other Ambulatory Visit: Payer: Self-pay | Admitting: Nurse Practitioner

## 2024-01-12 DIAGNOSIS — I4891 Unspecified atrial fibrillation: Secondary | ICD-10-CM

## 2024-01-12 DIAGNOSIS — Z79899 Other long term (current) drug therapy: Secondary | ICD-10-CM

## 2024-01-18 DIAGNOSIS — M25562 Pain in left knee: Secondary | ICD-10-CM | POA: Diagnosis not present

## 2024-01-24 DIAGNOSIS — Z79899 Other long term (current) drug therapy: Secondary | ICD-10-CM | POA: Diagnosis not present

## 2024-01-24 DIAGNOSIS — I4891 Unspecified atrial fibrillation: Secondary | ICD-10-CM | POA: Diagnosis not present

## 2024-01-25 ENCOUNTER — Encounter: Payer: Self-pay | Admitting: Nurse Practitioner

## 2024-01-25 LAB — LIPID PANEL
Chol/HDL Ratio: 2.8 {ratio} (ref 0.0–4.4)
Cholesterol, Total: 201 mg/dL — ABNORMAL HIGH (ref 100–199)
HDL: 71 mg/dL (ref >39)
LDL Chol Calc (NIH): 113 mg/dL — ABNORMAL HIGH (ref 0–99)
Triglycerides: 94 mg/dL (ref 0–149)
VLDL Cholesterol Cal: 17 mg/dL (ref 5–40)

## 2024-01-25 LAB — COMPREHENSIVE METABOLIC PANEL
ALT: 19 [IU]/L (ref 0–32)
AST: 25 [IU]/L (ref 0–40)
Albumin: 4.5 g/dL (ref 3.8–4.8)
Alkaline Phosphatase: 53 [IU]/L (ref 44–121)
BUN/Creatinine Ratio: 19 (ref 12–28)
BUN: 17 mg/dL (ref 8–27)
Bilirubin Total: 0.6 mg/dL (ref 0.0–1.2)
CO2: 22 mmol/L (ref 20–29)
Calcium: 9.9 mg/dL (ref 8.7–10.3)
Chloride: 104 mmol/L (ref 96–106)
Creatinine, Ser: 0.89 mg/dL (ref 0.57–1.00)
Globulin, Total: 2.3 g/dL (ref 1.5–4.5)
Glucose: 91 mg/dL (ref 70–99)
Potassium: 4.2 mmol/L (ref 3.5–5.2)
Sodium: 143 mmol/L (ref 134–144)
Total Protein: 6.8 g/dL (ref 6.0–8.5)
eGFR: 68 mL/min/{1.73_m2} (ref >59)

## 2024-01-30 DIAGNOSIS — M5459 Other low back pain: Secondary | ICD-10-CM | POA: Diagnosis not present

## 2024-01-30 DIAGNOSIS — M25562 Pain in left knee: Secondary | ICD-10-CM | POA: Diagnosis not present

## 2024-02-02 ENCOUNTER — Ambulatory Visit: Payer: Medicare Other | Admitting: Nurse Practitioner

## 2024-02-02 ENCOUNTER — Encounter: Payer: Self-pay | Admitting: Nurse Practitioner

## 2024-02-02 VITALS — BP 110/60 | HR 70 | Temp 98.1°F | Ht 64.0 in | Wt 108.0 lb

## 2024-02-02 DIAGNOSIS — F418 Other specified anxiety disorders: Secondary | ICD-10-CM

## 2024-02-02 DIAGNOSIS — E785 Hyperlipidemia, unspecified: Secondary | ICD-10-CM | POA: Diagnosis not present

## 2024-02-02 DIAGNOSIS — Z79899 Other long term (current) drug therapy: Secondary | ICD-10-CM

## 2024-02-02 DIAGNOSIS — Z0001 Encounter for general adult medical examination with abnormal findings: Secondary | ICD-10-CM

## 2024-02-02 DIAGNOSIS — Z0000 Encounter for general adult medical examination without abnormal findings: Secondary | ICD-10-CM

## 2024-02-02 MED ORDER — ROSUVASTATIN CALCIUM 5 MG PO TABS
ORAL_TABLET | ORAL | 0 refills | Status: DC
Start: 2024-02-02 — End: 2024-03-28

## 2024-02-02 MED ORDER — SERTRALINE HCL 50 MG PO TABS: ORAL_TABLET | ORAL | 3 refills | Status: AC

## 2024-02-02 NOTE — Progress Notes (Signed)
Subjective:    Patient ID: Pamela Yang, female    DOB: 05-04-1951, 73 y.o.   MRN: [id number]  HPI The patient comes in today for a wellness visit.  A review of their health history was completed. A review of medications was also completed.  Any needed refills; Zoloft  Eating habits: Diet is good and eats a variety of foods.   Falls/  MVA accidents in past few months: No  Regular exercise: Yes, she walks five times per week with group exercise 3 times per week.   Specialist pt sees on regular basis: Endocrinologist, electrophysiologist, and ortho  Preventative health issues were discussed. Health maintenance up to date.   Additional concerns: Patient says that she becomes nauseous while she has a bowel movement only about once a month. No vomiting occurs. Bowel habits normal. No syncope. No new sexual partners.  Regular vision and dental exams.  Review of Systems  Constitutional:  Negative for activity change, appetite change, fatigue and fever.  HENT:  Negative for sore throat and trouble swallowing.   Respiratory:  Negative for cough, chest tightness, shortness of breath and wheezing.   Cardiovascular:  Negative for chest pain and palpitations.  Gastrointestinal:  Positive for nausea. Negative for abdominal distention, abdominal pain, blood in stool, constipation, diarrhea and vomiting.       Rare about once a month and resolves quickly.   Genitourinary:  Negative for difficulty urinating, dysuria, enuresis, frequency, genital sores, pelvic pain, urgency, vaginal bleeding, vaginal discharge and vaginal pain.  Neurological:  Negative for weakness and headaches.  Psychiatric/Behavioral:  Negative for sleep disturbance. The patient is not nervous/anxious.       Objective:   Physical Exam Vitals and nursing note reviewed. Exam conducted with a chaperone present.  Constitutional:      General: She is not in acute distress.    Appearance: Normal appearance. She is  well-developed and normal weight. She is not ill-appearing.  Neck:     Thyroid: No thyroid mass or thyromegaly.     Trachea: No tracheal deviation.     Comments: Thyroid non tender to palpation. No mass or goiter noted.  Cardiovascular:     Rate and Rhythm: Normal rate and regular rhythm.     Pulses: Normal pulses.     Heart sounds: Normal heart sounds, S1 normal and S2 normal. No murmur heard. Pulmonary:     Effort: Pulmonary effort is normal. No respiratory distress.     Breath sounds: Normal breath sounds. No wheezing.  Chest:  Breasts:    Right: No swelling, inverted nipple, mass, skin change or tenderness.     Left: No swelling, inverted nipple, mass, skin change or tenderness.  Abdominal:     General: Abdomen is flat. There is no distension.     Palpations: Abdomen is soft.     Tenderness: There is no abdominal tenderness.     Comments: No obvious masses or abnormalities noted.   Genitourinary:    Comments: Defers GU exam. Denies any problems.  Musculoskeletal:     Cervical back: Normal range of motion and neck supple.  Lymphadenopathy:     Cervical: No cervical adenopathy.     Upper Body:     Right upper body: No supraclavicular, axillary or pectoral adenopathy.     Left upper body: No supraclavicular, axillary or pectoral adenopathy.  Skin:    General: Skin is warm and dry.  Neurological:     Mental Status: She is alert and oriented  to person, place, and time.  Psychiatric:        Mood and Affect: Mood normal.        Behavior: Behavior normal.        Thought Content: Thought content normal.        Judgment: Judgment normal.    Vitals:   02/02/24 0905  BP: 110/60  Pulse: 70  Temp: 98.1 F (36.7 C)  Height: 5\' 4"  (1.626 m)  Weight: 108 lb (49 kg)  SpO2: 98%  BMI (Calculated): 18.53    Results for orders placed or performed in visit on 01/12/24  Comprehensive metabolic panel   Collection Time: 01/24/24  7:54 AM  Result Value Ref Range   Glucose 91 70 - 99  mg/dL   BUN 17 8 - 27 mg/dL   Creatinine, Ser 0.98 0.57 - 1.00 mg/dL   eGFR 68 >11 BJ/YNW/2.95   BUN/Creatinine Ratio 19 12 - 28   Sodium 143 134 - 144 mmol/L   Potassium 4.2 3.5 - 5.2 mmol/L   Chloride 104 96 - 106 mmol/L   CO2 22 20 - 29 mmol/L   Calcium 9.9 8.7 - 10.3 mg/dL   Total Protein 6.8 6.0 - 8.5 g/dL   Albumin 4.5 3.8 - 4.8 g/dL   Globulin, Total 2.3 1.5 - 4.5 g/dL   Bilirubin Total 0.6 0.0 - 1.2 mg/dL   Alkaline Phosphatase 53 44 - 121 IU/L   AST 25 0 - 40 IU/L   ALT 19 0 - 32 IU/L  Lipid panel   Collection Time: 01/24/24  7:54 AM  Result Value Ref Range   Cholesterol, Total 201 (H) 100 - 199 mg/dL   Triglycerides 94 0 - 149 mg/dL   HDL 71 >62 mg/dL   VLDL Cholesterol Cal 17 5 - 40 mg/dL   LDL Chol Calc (NIH) 130 (H) 0 - 99 mg/dL   Chol/HDL Ratio 2.8 0.0 - 4.4 ratio       01/05/2024    2:02 PM 01/27/2023    9:59 AM 12/30/2022   10:47 AM 03/28/2022    2:09 PM 01/21/2022    2:21 PM  Depression screen PHQ 2/9  Decreased Interest 0 0 0 0 0  Down, Depressed, Hopeless 0 0 0 0 0  PHQ - 2 Score 0 0 0 0 0  Altered sleeping 0    0  Tired, decreased energy 0    0  Change in appetite 0    0  Feeling bad or failure about yourself  0    0  Trouble concentrating 0    0  Moving slowly or fidgety/restless 0    0  Suicidal thoughts 0    0  PHQ-9 Score 0    0  Difficult doing work/chores Not difficult at all    Not difficult at all       01/27/2023    9:59 AM 01/21/2022   10:58 AM 12/10/2020    9:37 AM 03/09/2020    3:53 PM  GAD 7 : Generalized Anxiety Score  Nervous, Anxious, on Edge 0 0 3 1  Control/stop worrying 0 0 2 1  Worry too much - different things 0 0 2 1  Trouble relaxing 0 0 2 2  Restless 0 0 3 2  Easily annoyed or irritable 0 0 3 1  Afraid - awful might happen 0 0 0 0  Total GAD 7 Score 0 0 15 8  Anxiety Difficulty   Very difficult Somewhat difficult  Assessment & Plan:  1. Well woman exam (no gynecological exam) (Primary) -Encouraged patient to  continue to exercise and follow a healthy diet.   2. Hyperlipidemia LDL goal <100  - Lipid panel -Discussed options with patient between cardiac CT scan and medication. Patient chooses to take Crestor M-W-F. Repeat labs in 8 weeks.  Meds ordered this encounter  Medications   rosuvastatin (CRESTOR) 5 MG tablet    Sig: Take one tab PO on Monday, Wednesday and Friday each week.    Dispense:  36 tablet    Refill:  0    Supervising Provider:   Lilyan Punt A [[id number]]   sertraline (ZOLOFT) 50 MG tablet    Sig: 1 by mouth PO QD    Dispense:  90 tablet    Refill:  3    Supervising Provider:   Babs Sciara [phone]  -Educated patient about symptoms to monitor for while taking Crestor such as muscle aches.    3. High risk medication use - Hepatic function panel  4. Depression with anxiety -Refilled Zoloft prescription.  Return in about 1 year (around 02/01/2025) for physical.   I have seen and examined this patient alongside the NP student. I have reviewed and verified the student note and agree with the assessment and plan.  Sherie Don, FNP

## 2024-02-04 ENCOUNTER — Encounter: Payer: Self-pay | Admitting: Nurse Practitioner

## 2024-02-06 DIAGNOSIS — M810 Age-related osteoporosis without current pathological fracture: Secondary | ICD-10-CM | POA: Diagnosis not present

## 2024-02-07 ENCOUNTER — Ambulatory Visit (HOSPITAL_COMMUNITY): Payer: Medicare Other | Admitting: Internal Medicine

## 2024-02-14 ENCOUNTER — Ambulatory Visit (HOSPITAL_COMMUNITY)
Admission: RE | Admit: 2024-02-14 | Discharge: 2024-02-14 | Disposition: A | Discharge status: Home / Self Care | Payer: Medicare Other | Source: Ambulatory Visit | Attending: Internal Medicine | Admitting: Internal Medicine

## 2024-02-14 VITALS — BP 142/70 | HR 44 | Ht 64.0 in | Wt 108.6 lb

## 2024-02-14 DIAGNOSIS — Z5181 Encounter for therapeutic drug level monitoring: Secondary | ICD-10-CM | POA: Diagnosis not present

## 2024-02-14 DIAGNOSIS — Z79899 Other long term (current) drug therapy: Secondary | ICD-10-CM | POA: Insufficient documentation

## 2024-02-14 DIAGNOSIS — Z7901 Long term (current) use of anticoagulants: Secondary | ICD-10-CM | POA: Diagnosis not present

## 2024-02-14 DIAGNOSIS — M810 Age-related osteoporosis without current pathological fracture: Secondary | ICD-10-CM | POA: Diagnosis not present

## 2024-02-14 DIAGNOSIS — I4891 Unspecified atrial fibrillation: Secondary | ICD-10-CM | POA: Insufficient documentation

## 2024-02-14 DIAGNOSIS — I4892 Unspecified atrial flutter: Secondary | ICD-10-CM | POA: Insufficient documentation

## 2024-02-14 LAB — MAGNESIUM: Magnesium: 2.2 mg/dL (ref 1.7–2.4)

## 2024-02-14 MED ORDER — POTASSIUM CHLORIDE CRYS ER 10 MEQ PO TBCR: 10.0000 meq | EXTENDED_RELEASE_TABLET | Freq: Every day | ORAL | 2 refills | Status: DC

## 2024-02-14 NOTE — Progress Notes (Signed)
 Primary Care Physician: Babs Sciara, MD Primary Cardiologist: Dina Rich, MD Electrophysiologist: Maurice Small, MD     Referring Physician: Dr. Odie Sera Pamela Yang is a 73 y.o. female with a history of GERD, SVT, junctional tachycardia, and atrial flutter who presents for consultation in the [facility].  History of junctional rhythm resolved off of flecainide. Recent ED visits for atrial flutter with RVR and possibly SVT; seen again for same on 9/14 and admitted for Tikosyn load. Discharged on Tikosyn 250 mcg BID. Patient is on Xarelto 15 mg daily for a CHADS2VASC score of 2.  On evaluation today, patient is currently in NSR. She did not require cardioversion during Tikosyn load admission. The patient was monitored until discharge on telemetry post conversion maintaining SB 40's-50s with some intermittent junctional rhythm, her junctional rates reaching 60's-70's, all with same narrow QRS morphology. This is known for her and asymptomatic. She has not missed any doses of Tikosyn or Xarelto. She feels well overall.   On follow up 10/23/23, she is currently in NSR. She feels well overall and has had no episodes of Afib since last office visit. No missed doses of Tikosyn or Xarelto.   On follow up 02/14/24, she is here for Tikosyn surveillance. She is in NSR. She has had no episodes of Afib since last office visit. She stay active and goes to the [facility] 3-4 times per week and walks her dogs frequently. No missed doses of Tikosyn 250 mcg or Xarelto 15 mg.   Today, she denies symptoms of palpitations, chest pain, shortness of breath, orthopnea, PND, lower extremity edema, dizziness, presyncope, syncope, snoring, daytime somnolence, bleeding, or neurologic sequela. The patient is tolerating medications without difficulties and is otherwise without complaint today.    she has a BMI of Body mass index is 18.64 kg/m.Marland Kitchen Filed Weights   02/14/24 1015   Weight: 49.3 kg    Current Outpatient Medications  Medication Sig Dispense Refill   Ascorbic Acid (VITAMIN C PO) Take 500 mg by mouth in the morning and at bedtime.     denosumab (PROLIA) 60 MG/ML SOSY injection Inject 60 mg into the skin every 6 (six) months.     dofetilide (TIKOSYN) 250 MCG capsule Take 1 capsule (250 mcg total) by mouth 2 (two) times daily. 180 capsule 1   Flaxseed, Linseed, (FLAXSEED OIL) 1200 MG CAPS Take 1,200 mg by mouth 2 (two) times daily.     gabapentin (NEURONTIN) 300 MG capsule Take 1 capsule twice a day by oral route for 30 days.     metoprolol tartrate (LOPRESSOR) 25 MG tablet Take 1 tablet (25 mg total) by mouth 2 (two) times daily as needed (Palpitations and elevated heart rate). 30 tablet 0   Multiple Vitamins-Minerals (MULTIVITAMIN WITH MINERALS) tablet Take 1 tablet by mouth daily.     Propylene Glycol (SYSTANE BALANCE) 0.6 % SOLN Place 2 drops into both eyes in the morning and at bedtime. Into both eyes     rosuvastatin (CRESTOR) 5 MG tablet Take one tab PO on Monday, Wednesday and Friday each week. 36 tablet 0   sertraline (ZOLOFT) 50 MG tablet 1 by mouth PO QD 90 tablet 3   XARELTO 15 MG TABS tablet TAKE ONE (1) TABLET BY MOUTH EVERY DAY 90 tablet 1   potassium chloride (KLOR-CON M) 10 MEQ tablet Take 1 tablet (10 mEq total) by mouth daily. 90 tablet 2   No current facility-administered medications  for this encounter.    Atrial Fibrillation Management history:  Previous antiarrhythmic drugs: flecainide, tikosyn Previous cardioversions: none Previous ablations: 03/15/23 Anticoagulation history: Xarelto 15   ROS- All systems are reviewed and negative except as per the HPI above.  Physical Exam: BP (!) 142/70   Pulse (!) 44   Ht 5\' 4"  (1.626 m)   Wt 49.3 kg   BMI 18.64 kg/m   GEN- The patient is well appearing, alert and oriented x 3 today.   Neck - no JVD or carotid bruit noted Lungs- Clear to ausculation bilaterally, normal work of  breathing Heart- Regular bradycardic rate and rhythm, no murmurs, rubs or gallops, PMI not laterally displaced Extremities- no clubbing, cyanosis, or edema Skin - no rash or ecchymosis noted   EKG today demonstrates  Vent. rate 44 BPM PR interval 116 ms QRS duration 80 ms QT/QTcB 516/441 ms P-R-T axes -87 42 26 Unusual P axis and short PR, probable junctional bradycardia with Premature supraventricular complexes Anteroseptal infarct , age undetermined Abnormal ECG When compared with ECG of 06-Nov-2023 12:00, PREVIOUS ECG IS PRESENT  Echo 09/04/23 demonstrated   1. Intermittent junctional rhythm.   2. Left ventricular ejection fraction, by estimation, is 60 to 65%. The  left ventricle has normal function. The left ventricle has no regional  wall motion abnormalities. Left ventricular diastolic parameters are  indeterminate.   3. Right ventricular systolic function is normal. The right ventricular  size is normal. There is normal pulmonary artery systolic pressure.   4. A small pericardial effusion is present.   5. The mitral valve is normal in structure. Trivial mitral valve  regurgitation. No evidence of mitral stenosis.   6. Tricuspid valve regurgitation is moderate.   7. The aortic valve is tricuspid. Aortic valve regurgitation is not  visualized. Aortic valve sclerosis/calcification is present, without any  evidence of aortic stenosis.   8. The inferior vena cava is dilated in size with >50% respiratory  variability, suggesting right atrial pressure of 8 mmHg.    ASSESSMENT & PLAN CHA2DS2-VASc Score = 2  The patient's score is based upon: CHF History: 0 HTN History: 0 Diabetes History: 0 Stroke History: 0 Vascular Disease History: 0 Age Score: 1 Gender Score: 1      ASSESSMENT AND PLAN: Paroxysmal Atrial Flutter The patient's CHA2DS2-VASc score is 2, indicating a 2.2% annual risk of stroke.   S/p Atrial flutter ablation on 03/15/23.  S/p Tikosyn admission  9/14-18/24.  She is currently in sinus rhythm vs junctional.  High risk medication monitoring (ICD10: Z79.899) Patient requires ongoing monitoring for anti-arrhythmic medication which has the potential to cause life threatening arrhythmias or AV block. Qtc stable. Continue Tikosyn 250 mcg BID. Mag drawn today. Cmet from 01/24/24 is stable.  She wishes to remain on Xarelto 15 mg daily. She will turn 73 on 5[birth year]31 and this will change her Chadsvasc score to 3.     Follow up 3 months for Tikosyn surveillance.   Lake Bells, PA-C  [facility] [address] [place], [place] [address] [phone]

## 2024-02-23 DIAGNOSIS — M5451 Vertebrogenic low back pain: Secondary | ICD-10-CM | POA: Diagnosis not present

## 2024-03-01 DIAGNOSIS — M5459 Other low back pain: Secondary | ICD-10-CM | POA: Diagnosis not present

## 2024-03-04 ENCOUNTER — Telehealth: Payer: Self-pay | Admitting: Cardiology

## 2024-03-04 NOTE — Telephone Encounter (Signed)
   Pre-operative Risk Assessment    Patient Name: Pamela Yang  DOB: 1951/04/11 MRN: [id number]      Request for Surgical Clearance    Procedure:   lumbar selective nerve root block  Date of Surgery:  Clearance 03/13/24                                 Surgeon:  Dr. Drucie Ip  Surgeon's Group or Practice Name:  [facility]  Phone number:  [phone] Fax number:  [phone]   Type of Clearance Requested:   - Medical    Type of Anesthesia:  Not Indicated   Additional requests/questions:    Lajuana Matte   03/04/2024, 5:10 PM

## 2024-03-05 NOTE — Telephone Encounter (Signed)
 Patient with diagnosis of atrial fibrillation on Xarelto for anticoagulation.       CHA2DS2-VASc Score = 2   This indicates a 2.2% annual risk of stroke. The patient's score is based upon: CHF History: 0 HTN History: 0 Diabetes History: 0 Stroke History: 0 Vascular Disease History: 0 Age Score: 1 Gender Score: 1      CrCl 49 Platelet count 163  Per office protocol, patient can hold Xarelto for 3 days prior to procedure.   Patient will not need bridging with Lovenox (enoxaparin) around procedure.  **This guidance is not considered finalized until pre-operative APP has relayed final recommendations.**

## 2024-03-05 NOTE — Telephone Encounter (Signed)
     Primary Cardiologist: Dina Rich, MD  Chart reviewed as part of pre-operative protocol coverage. Given past medical history and time since last visit, based on ACC/AHA guidelines, FLORENE BRILL would be at acceptable risk for the planned procedure without further cardiovascular testing.   Per office protocol, patient can hold Xarelto for 3 days prior to procedure.   Patient will not need bridging with Lovenox (enoxaparin) around procedure.  I will route this recommendation to the requesting party via Epic fax function and remove from pre-op pool.  Please call with questions.  Thomasene Ripple. Faigy Stretch NP-C     03/05/2024, 1:48 PM [facility] [address] Office [phone] Fax [phone]

## 2024-03-06 ENCOUNTER — Other Ambulatory Visit (HOSPITAL_COMMUNITY): Payer: Self-pay | Admitting: Family Medicine

## 2024-03-06 DIAGNOSIS — Z1231 Encounter for screening mammogram for malignant neoplasm of breast: Secondary | ICD-10-CM

## 2024-03-08 ENCOUNTER — Ambulatory Visit: Payer: Self-pay

## 2024-03-08 NOTE — Telephone Encounter (Signed)
  Chief Complaint: shortness of breath Symptoms: mild shortness of breath Frequency: "the last few months" Pertinent Negatives: Patient denies chest pain, sob @ rest, cough Disposition: [] ED /[] Urgent Care (no appt availability in office) / [x] Appointment(In office/virtual)/ []  [facility]/ [] Home Care/ [] Refused Recommended Disposition /[] [facility]/ []  Follow-up with PCP Additional Notes: Patient reports that she has had mild shortness of breath over "the last few months". Patient reports she mentioned this at her appt in February and was told to follow up with cardiology, but states cardiology is not able to get her in until July. Patient reports her sob comes and goes with exertion. Patient reports being winded with exercise but denies all other symptoms. Per protocol, appt scheduled in office next available Monday 3/24. Patient reports unable to be seen sooner due to scheduling and only wants to see Dr. Gerda Diss. Patient advised to call back with worsening symptoms. Patient verbalized understanding.    Copied from CRM [id number]. Topic: Clinical - Red Word Triage >> Mar 08, 2024  9:39 AM Izetta Dakin wrote: [facility] that prompted transfer to Nurse Triage: SOB Reason for Disposition  [1] MILD longstanding difficulty breathing AND [2]  SAME as normal  Answer Assessment - Initial Assessment Questions 1. RESPIRATORY STATUS: "Describe your breathing?" (e.g., wheezing, shortness of breath, unable to speak, severe coughing)      Shortness of breath 2. ONSET: "When did this breathing problem begin?"      A couple months, worsening 3. PATTERN "Does the difficult breathing come and go, or has it been constant since it started?"      Comes and goes 4. SEVERITY: "How bad is your breathing?" (e.g., mild, moderate, severe)    - MILD: No SOB at rest, mild SOB with walking, speaks normally in sentences, can lie down, no retractions, pulse < 100.    - MODERATE: SOB at rest, SOB with  minimal exertion and prefers to sit, cannot lie down flat, speaks in phrases, mild retractions, audible wheezing, pulse 100-120.    - SEVERE: Very SOB at rest, speaks in single words, struggling to breathe, sitting hunched forward, retractions, pulse > 120      mild 5. RECURRENT SYMPTOM: "Have you had difficulty breathing before?" If Yes, ask: "When was the last time?" and "What happened that time?"      Comes and goes the past few months, worsening 6. CARDIAC HISTORY: "Do you have any history of heart disease?" (e.g., heart attack, angina, bypass surgery, angioplasty)      afib 7. LUNG HISTORY: "Do you have any history of lung disease?"  (e.g., pulmonary embolus, asthma, emphysema)     none 8. CAUSE: "What do you think is causing the breathing problem?"      unsure 9. OTHER SYMPTOMS: "Do you have any other symptoms? (e.g., dizziness, runny nose, cough, chest pain, fever)     none 10. O2 SATURATION MONITOR:  "Do you use an oxygen saturation monitor (pulse oximeter) at home?" If Yes, ask: "What is your reading (oxygen level) today?" "What is your usual oxygen saturation reading?" (e.g., 95%)       unsure  Protocols used: Breathing Difficulty-A-AH

## 2024-03-11 ENCOUNTER — Ambulatory Visit (HOSPITAL_COMMUNITY)
Admission: RE | Admit: 2024-03-11 | Discharge: 2024-03-11 | Disposition: A | Discharge status: Home / Self Care | Source: Ambulatory Visit | Attending: Family Medicine | Admitting: Family Medicine

## 2024-03-11 ENCOUNTER — Encounter: Payer: Self-pay | Admitting: Cardiology

## 2024-03-11 ENCOUNTER — Ambulatory Visit: Admitting: Family Medicine

## 2024-03-11 ENCOUNTER — Encounter: Payer: Self-pay | Admitting: Family Medicine

## 2024-03-11 VITALS — BP 130/70 | HR 58 | Temp 97.7°F | Ht 64.0 in | Wt 106.0 lb

## 2024-03-11 DIAGNOSIS — I4891 Unspecified atrial fibrillation: Secondary | ICD-10-CM

## 2024-03-11 DIAGNOSIS — R0609 Other forms of dyspnea: Secondary | ICD-10-CM | POA: Diagnosis not present

## 2024-03-11 DIAGNOSIS — R918 Other nonspecific abnormal finding of lung field: Secondary | ICD-10-CM | POA: Diagnosis not present

## 2024-03-11 NOTE — Progress Notes (Unsigned)
   Subjective:    Patient ID: Pamela Yang, female    DOB: [birth year], 73 y.o.   MRN: [id number]  HPI SOB when walking Discussed the use of AI scribe software for clinical note transcription with the patient, who gave verbal consent to proceed.  History of Present Illness   Pamela Yang is a 73 year old female with atrial fibrillation who presents with shortness of breath on exertion.  She has been experiencing shortness of breath over the past three months, particularly during activities such as walking her dogs, climbing stairs, and participating in cardio classes. An episode of being 'almost breathless' occurred while talking on the phone and walking her dogs. This is unusual for her given her previously active lifestyle. The shortness of breath is more pronounced with exertion, such as walking on an incline or climbing stairs quickly. Occasionally, she experiences shortness of breath without significant exertion, which she wonders might be related to anxiety.  No significant cough, fever, or swelling in her legs. She sleeps with one pillow and does not experience chest heaviness, although she had a brief episode of right-sided chest pain lasting one to two days, attributed to a possible muscle strain.  Her past medical history includes atrial fibrillation, which is currently well-controlled. She takes Xarelto regularly without bleeding issues and uses metoprolol as needed, being cautious due to its effect on her blood pressure.        Review of Systems     Objective:   Physical Exam  General-in no acute distress Eyes-no discharge Lungs-respiratory rate normal, CTA CV-no murmurs,RRR Extremities skin warm dry no edema Neuro grossly normal Behavior normal, alert       Assessment & Plan:  Assessment and Plan    Dyspnea on Exertion (DOE) Shortness of breath during exertion. Cardiac and pulmonary causes considered. Echocardiogram normal. Further testing required. - Order  chest x-ray. - Order BNP blood test. - Order pulmonary function test. - Coordinate with cardiologist. - Consider referral to pulmonologist based on test results. - Review chest x-ray images personally and follow up on radiologist's report. - Ensure follow-up if no contact from lung function test scheduling within 10 days.  Atrial Fibrillation Atrial fibrillation well-controlled on Xarelto and metoprolol. Unlikely cause of current dyspnea. Coordination with cardiologist planned. - Continue Xarelto. - Coordinate with cardiologist regarding atrial fibrillation management.     Depression and stress level stable  If pulmonary testing does not adequately explain her dyspnea on exertion then cardiology will need to do further testing Certainly if necessary consider coronary calcium testing as well  I have sent a message to her cardiologist awaiting their input regarding elevated BNP

## 2024-03-12 ENCOUNTER — Encounter: Payer: Self-pay | Admitting: Family Medicine

## 2024-03-12 LAB — BRAIN NATRIURETIC PEPTIDE: BNP: 227.9 pg/mL — ABNORMAL HIGH (ref 0.0–100.0)

## 2024-03-13 ENCOUNTER — Encounter: Payer: Self-pay | Admitting: Family Medicine

## 2024-03-13 ENCOUNTER — Telehealth: Payer: Self-pay | Admitting: Family Medicine

## 2024-03-13 DIAGNOSIS — M5416 Radiculopathy, lumbar region: Secondary | ICD-10-CM | POA: Diagnosis not present

## 2024-03-13 NOTE — Progress Notes (Unsigned)
 Cardiology Office Note    Date:  03/14/2024  ID:  Pamela, Yang Mar 12, 1951, MRN [id number] Cardiologist: Dina Rich, MD   EP: Dr. Nelly Laurence  History of Present Illness:    Pamela Yang is a 73 y.o. female with past medical history of paroxysmal atrial fibrillation,  osteoporosis and family history of CAD who presents to the office today for evaluation of dyspnea on exertion.  She was examined by myself in 10/2022 and had overall been feeling well at that time but was noted to have a junctional escape rhythm on her EKG with heart rate in the 50's. She had been taking Flecainide 50 mg twice daily and this was stopped. A 7-day Zio patch was recommended and she was referred to EP for further management. She ultimately underwent atrial flutter ablation in 02/2023 by Dr. Nelly Laurence. She did have recurrent episodes of atrial flutter with RVR and possible SVT following this and ultimately underwent Tikosyn loading in 08/2023.  She has since been followed closely by the [facility] and Dr. Nelly Laurence with most recent visit being with Pamela Bells, PA in 01/2024. Was feeling well at that time and denied any chest pain or palpitations. EKG at that time did show junctional bradycardia and no changes were made to her medications. Was continued on Tikosyn 250 mcg twice daily and Xarelto for anticoagulation.  In the interim, she did reach out to the office for evaluation of worsening shortness of breath with exertion for approximately 3 months which occurred with exercise or climbing steps. A follow-up visit was therefore arranged.  In talking the patient and her neighbor today, she reports having worsening dyspnea on exertion for the past 2 to 3 months. This occurs when walking her dog up hills or when climbing steps. Denies any associated exertional chest pain or pressure. Does report having one episode of chest pain within the past month and this lasted for a few hours and then resolved.  No specific orthopnea, PND or pitting edema. No recent lightheadedness, dizziness or presyncope. She is listed as taking Lopressor as needed but has not utilized this in over 6 months. She denies any symptoms resembling her prior atrial fibrillation. Remains on Xarelto for anticoagulation with no reports of active bleeding.  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 No acute changes. Confirmed by Randall An ([id number]) on 03/14/2024 1:57:11 PM       Echocardiogram: 08/2023 IMPRESSIONS     1. Intermittent junctional rhythm.   2. Left ventricular ejection fraction, by estimation, is 60 to 65%. The  left ventricle has normal function. The left ventricle has no regional  wall motion abnormalities. Left ventricular diastolic parameters are  indeterminate.   3. Right ventricular systolic function is normal. The right ventricular  size is normal. There is normal pulmonary artery systolic pressure.   4. A small pericardial effusion is present.   5. The mitral valve is normal in structure. Trivial mitral valve  regurgitation. No evidence of mitral stenosis.   6. Tricuspid valve regurgitation is moderate.   7. The aortic valve is tricuspid. Aortic valve regurgitation is not  visualized. Aortic valve sclerosis/calcification is present, without any  evidence of aortic stenosis.   8. The inferior vena cava is dilated in size with >50% respiratory  variability, suggesting right atrial pressure of 8 mmHg.   Comparison(s): No prior Echocardiogram.    Risk Assessment/Calculations:    CHA2DS2-VASc Score = 2   This indicates a 2.2% annual risk of stroke. The patient's score is based  upon: CHF History: 0 HTN History: 0 Diabetes History: 0 Stroke History: 0 Vascular Disease History: 0 Age Score: 1 Gender Score: 1     Physical Exam:   VS:  BP 122/64   Pulse (!) 47   Ht 5\' 4"  (1.626 m)   Wt 106 lb 9.6 oz (48.4 kg)   SpO2 96%   BMI 18.30 kg/m    Wt Readings from Last 3 Encounters:  03/14/24 106 lb 9.6 oz (48.4 kg)  03/11/24 106 lb (48.1 kg)  02/14/24 108 lb 9.6 oz (49.3 kg)     GEN: Pleasant female appearing in no acute distress NECK: No JVD; No carotid bruits CARDIAC: Regular rhythm, bradycardic rate. No significant murmurs. RESPIRATORY:  Clear to auscultation without rales, wheezing or rhonchi  ABDOMEN: Appears non-distended. No obvious abdominal masses. EXTREMITIES: No clubbing or cyanosis. No pitting edema.  Distal pedal pulses are 2+ bilaterally.   Assessment and Plan:   1. Dyspnea on Exertion - Reports progressive dyspnea on exertion for the past 2 to 3 months as discussed above and one episode of chest pain in the interim. While she does not have known CAD, she does have cardiac risk factors including HLD and family history of CAD in her mother and sister. Would also be concerned about chronotropic incompetence given junctional rhythm by prior EKG tracings. Reviewed options with the patient and will plan to obtain a Coronary CTA for ischemic evaluation and a 7-day Zio patch as previously recommend by EP to assess HR response and rhythm. Will also recheck a CBC today to rule-out anemia as an etiology of her symptoms.   2. Paroxysmal Atrial Fibrillation and Atrial Flutter - She has been followed closely by Dr. Nelly Laurence and the [facility] and underwent ablation in 02/2023 and had recurrence and is now on Tikosyn 250 mcg twice daily. Maintaining sinus rhythm by examination and EKG today. - No reports of active bleeding. Remains on Xarelto 15 mg daily for anticoagulation This is the appropriate dose given her calculated creatinine clearance of  42 mL/min based off most recent labs.  3. Junctional Rhythm - Noted on prior EKG tracings and she is in sinus bradycardia today. Will plan for a 1-week Zio patch as discussed above.  Signed, Ellsworth Lennox, PA-C

## 2024-03-13 NOTE — Telephone Encounter (Signed)
    Hi Dr. Gerda Diss,   We are trying to get her in with myself or Dr. Wyline Mood this week. When she saw the [facility] last month, her EKG appeared to have some junctional beats and she mentioned in the note she sent our office that her HR is staying in the 40's. Looks like she only takes Lopressor as needed. I am concerned she could be having chronotropic incompetence and will plan for a repeat EKG at the time of her visit. If significantly bradycardiac still, can arrange for a GXT for further assessment. If no bradycardia and her story is more concerning for an ischemic etiology, will likely arrange for a Coronary CTA.   Best, Ellsworth Lennox, PA-C 03/13/2024, 8:20 AM

## 2024-03-13 NOTE — Telephone Encounter (Signed)
 Thank you so much for reaching out to her.  Definitely sounds like a good plan.  Thanks again-Rutledge Selsor [employer]

## 2024-03-13 NOTE — Telephone Encounter (Signed)
 Hi [name]  Recently I saw Pamela Yang.  As you are aware she has underlying atrial fibrillation.  She has noticed increased shortness of breath with activity which is out of character for her.  She did not have any overt failure on exam.  Her BNP is slightly elevated.  I sent a message to her electrophysiologist as well.  They referred me to you all regarding this issue.  My question: Would she benefit from a up-to-date echo?  Also she is having some mild hyperlipidemia through the years would she need evaluation for possibility of underlying coronary artery disease because of DOE?  [name] is more than open to being seen in the near future by your group for further evaluation if you feel like that would be the better route.  I appreciate your input-below is the message that I sent to Dr. [name] family medicine  Hi Dr.[name] is a mutual patient.  She has underlying atrial fibrillation.  She is notice dyspnea on exertion.  She has been very faithful through the years going to the [facility] to do classes as well as swimming.  She is noticed increased shortness of breath with physical activity which is out of character for her.  On physical exam I did not find any evidence of congestive heart failure.  Chest x-ray films look good but awaiting radiologist reading.  We also ordered pulmonary function test which are pending.  Her BNP came back slightly elevated-her last echo was September 2024.  From your perspective as her cardiologist would you recommend any further testing.  A follow-up echo of any sort?  She stated that she would be more than open to being followed up by your group as well.  I appreciate your reply regarding this nice patient-Pamela Yang family medicine

## 2024-03-13 NOTE — Progress Notes (Deleted)
 Cardiology Office Note    Date:  03/13/2024  ID:  Pamela Yang, Pamela Yang 1950/12/24, MRN [id number] Cardiologist: Dina Rich, MD   EP: Dr. Nelly Laurence  History of Present Illness:    Pamela Yang is a 73 y.o. female with past medical history of paroxysmal atrial fibrillation osteoporosis who presents to the office today for evaluation of dyspnea on exertion.  She was last exam by myself in 10/2022 and overall been feeling well at that time it was noted to have a junctional escape rhythm on her EKG with heart rate in the 50s.  She had been taking flecainide 50 mg twice daily and this was stopped.  A 7-day Zio patch was recommended she was referred to EP for further management.  She ultimately underwent atrial flutter ablation in 02/2023 by Dr. Nelly Laurence.  She did have recurrent episodes of atrial flutter with RVR and possible SVT following this and ultimately underwent Tikosyn loading in 08/2023.  She has since been followed closely by the atrial fibrillation clinic and Dr. Nelly Laurence with most recent visit being with [place] Bells, PA in 01/2024.  Was feeling well at that time and denied any chest pain, palpitations or dyspnea on exertion.  EKG at that time did show junctional bradycardia and no changes were made to her medications.  Was continued on Tikosyn 250 mcg twice daily and Xarelto for anticoagulation.  In the interim, she did reach out to her office for evaluation of worsening shortness of breath with exertion for approximately 3 months which occurs with exercise or climbing steps.  A follow-up visit was therefore arranged.  - Coronary CTA? - 2 week Zio  ROS: ***  Studies Reviewed:   EKG: EKG is*** ordered today and demonstrates ***   EKG Interpretation Date/Time:    Ventricular Rate:    PR Interval:    QRS Duration:    QT Interval:    QTC Calculation:   R Axis:      Text Interpretation:         Echocardiogram: 08/2023 IMPRESSIONS     1. Intermittent junctional  rhythm.   2. Left ventricular ejection fraction, by estimation, is 60 to 65%. The  left ventricle has normal function. The left ventricle has no regional  wall motion abnormalities. Left ventricular diastolic parameters are  indeterminate.   3. Right ventricular systolic function is normal. The right ventricular  size is normal. There is normal pulmonary artery systolic pressure.   4. A small pericardial effusion is present.   5. The mitral valve is normal in structure. Trivial mitral valve  regurgitation. No evidence of mitral stenosis.   6. Tricuspid valve regurgitation is moderate.   7. The aortic valve is tricuspid. Aortic valve regurgitation is not  visualized. Aortic valve sclerosis/calcification is present, without any  evidence of aortic stenosis.   8. The inferior vena cava is dilated in size with >50% respiratory  variability, suggesting right atrial pressure of 8 mmHg.   Comparison(s): No prior Echocardiogram.    Risk Assessment/Calculations:   {Does this patient have ATRIAL FIBRILLATION?:[id number]} No BP recorded.  {Refresh Note OR Click here to enter BP  :1}***         Physical Exam:   VS:  There were no vitals taken for this visit.   Wt Readings from Last 3 Encounters:  03/11/24 106 lb (48.1 kg)  02/14/24 108 lb 9.6 oz (49.3 kg)  02/02/24 108 lb (49 kg)     GEN: Well nourished, well developed  in no acute distress NECK: No JVD; No carotid bruits CARDIAC: ***RRR, no murmurs, rubs, gallops RESPIRATORY:  Clear to auscultation without rales, wheezing or rhonchi  ABDOMEN: Appears non-distended. No obvious abdominal masses. EXTREMITIES: No clubbing or cyanosis. No edema.  Distal pedal pulses are 2+ bilaterally.   Assessment and Plan:   1. Dyspnea on Exertion - ***  2. Paroxysmal Atrial Fibrillation and Atrial Flutter - ***  3. Junctional Rhythm - ***  Signed, Ellsworth Lennox, PA-C

## 2024-03-14 ENCOUNTER — Ambulatory Visit: Attending: Student | Admitting: Student

## 2024-03-14 ENCOUNTER — Other Ambulatory Visit (HOSPITAL_COMMUNITY)
Admission: RE | Admit: 2024-03-14 | Discharge: 2024-03-14 | Disposition: A | Discharge status: Home / Self Care | Source: Ambulatory Visit | Attending: Student | Admitting: Student

## 2024-03-14 ENCOUNTER — Ambulatory Visit

## 2024-03-14 ENCOUNTER — Ambulatory Visit: Admitting: Student

## 2024-03-14 ENCOUNTER — Encounter: Payer: Self-pay | Admitting: Student

## 2024-03-14 VITALS — BP 122/64 | HR 47 | Ht 64.0 in | Wt 106.6 lb

## 2024-03-14 DIAGNOSIS — I480 Paroxysmal atrial fibrillation: Secondary | ICD-10-CM

## 2024-03-14 DIAGNOSIS — R0609 Other forms of dyspnea: Secondary | ICD-10-CM

## 2024-03-14 DIAGNOSIS — R001 Bradycardia, unspecified: Secondary | ICD-10-CM | POA: Insufficient documentation

## 2024-03-14 LAB — CBC
HCT: 38.8 % (ref 36.0–46.0)
Hemoglobin: 12.2 g/dL (ref 12.0–15.0)
MCH: 27.5 pg (ref 26.0–34.0)
MCHC: 31.4 g/dL (ref 30.0–36.0)
MCV: 87.4 fL (ref 80.0–100.0)
Platelets: 186 10*3/uL (ref 150–400)
RBC: 4.44 MIL/uL (ref 3.87–5.11)
RDW: 14.3 % (ref 11.5–15.5)
WBC: 11.5 10*3/uL — ABNORMAL HIGH (ref 4.0–10.5)
nRBC: 0 % (ref 0.0–0.2)

## 2024-03-14 LAB — BASIC METABOLIC PANEL WITH GFR
Anion gap: 9 (ref 5–15)
BUN: 19 mg/dL (ref 8–23)
CO2: 25 mmol/L (ref 22–32)
Calcium: 10.3 mg/dL (ref 8.9–10.3)
Chloride: 105 mmol/L (ref 98–111)
Creatinine, Ser: 0.76 mg/dL (ref 0.44–1.00)
GFR, Estimated: >60 mL/min (ref >60)
Glucose, Bld: 107 mg/dL — ABNORMAL HIGH (ref 70–99)
Potassium: 4.8 mmol/L (ref 3.5–5.1)
Sodium: 139 mmol/L (ref 135–145)

## 2024-03-14 NOTE — Patient Instructions (Signed)
 Medication Instructions:  Your physician recommends that you continue on your current medications as directed. Please refer to the Current Medication list given to you today.  *If you need a refill on your cardiac medications before your next appointment, please call your pharmacy*   Lab Work: BMET CBC  If you have labs (blood work) drawn today and your tests are completely normal, you will receive your results only by: MyChart Message (if you have MyChart) OR A paper copy in the mail If you have any lab test that is abnormal or we need to change your treatment, we will call you to review the results.   Testing/Procedures: Coronary CT Long Term Monitor    Follow-Up: At [facility], you and your health needs are our priority.  As part of our continuing mission to provide you with exceptional heart care, we have created designated Provider Care Teams.  These Care Teams include your primary Cardiologist (physician) and Advanced Practice Providers (APPs -  Physician Assistants and Nurse Practitioners) who all work together to provide you with the care you need, when you need it.  We recommend signing up for the patient portal called "MyChart".  Sign up information is provided on this After Visit Summary.  MyChart is used to connect with patients for Virtual Visits (Telemedicine).  Patients are able to view lab/test results, encounter notes, upcoming appointments, etc.  Non-urgent messages can be sent to your provider as well.   To learn more about what you can do with MyChart, go to [email or web address].    Your next appointment:   2-3 month(s)  Provider:   You may see Dina Rich, MD or one of the following Advanced Practice Providers on your designated Care Team:   Randall An, PA-C  Jacolyn Reedy, PA-C     Other Instructions      Christena Deem- Long Term Monitor Instructions   Your physician has requested you wear your ZIO patch monitor 7 days.   This is a  single patch monitor.  [employer] supplies one patch monitor per enrollment.  Additional stickers are not available.   Please do not apply patch if you will be having a Nuclear Stress Test, Echocardiogram, Cardiac CT, MRI, or Chest Xray during the time frame you would be wearing the monitor. The patch cannot be worn during these tests.  You cannot remove and re-apply the ZIO XT patch monitor.   Your ZIO patch monitor will be sent USPS Priority mail from [facility] directly to your home address. The monitor may also be mailed to a PO BOX if home delivery is not available.   It may take 3-5 days to receive your monitor after you have been enrolled.   Once you have received you monitor, please review enclosed instructions.  Your monitor has already been registered assigning a specific monitor serial # to you.   Applying the monitor   Shave hair from upper left chest.   Hold abrader disc by orange tab.  Rub abrader in 40 strokes over left upper chest as indicated in your monitor instructions.   Clean area with 4 enclosed alcohol pads .  Use all pads to assure are is cleaned thoroughly.  Let dry.   Apply patch as indicated in monitor instructions.  Patch will be place under collarbone on left side of chest with arrow pointing upward.   Rub patch adhesive wings for 2 minutes.Remove white label marked "1".  Remove white label marked "2".  Rub patch adhesive wings  for 2 additional minutes.   While looking in a mirror, press and release button in center of patch.  A small green light will flash 3-4 times .  This will be your only indicator the monitor has been turned on.     Do not shower for the first 24 hours.  You may shower after the first 24 hours.   Press button if you feel a symptom. You will hear a small click.  Record Date, Time and Symptom in the Patient Log Book.   When you are ready to remove patch, follow instructions on last 2 pages of Patient Log Book.  Stick patch monitor onto  last page of Patient Log Book.   Place Patient Log Book in [place] box.  Use locking tab on box and tape box closed securely.  The [employer] and [employer] has [employer] on it.  Please place in mailbox as soon as possible.  Your physician should have your test results approximately 7 days after the monitor has been mailed back to [facility].   Call [facility] Customer Care at [phone] if you have questions regarding your ZIO XT patch monitor.  Call them immediately if you see an orange light blinking on your monitor.   If your monitor falls off in less than 4 days contact our Monitor department at [phone].  If your monitor becomes loose or falls off after 4 days call [employer] at [phone] for suggestions on securing your monitor.         Your cardiac CT will be scheduled at one of the below locations:   [facility] [address] [place], [place] [address] [phone]  If scheduled at [facility], please arrive at the [facility] and Children's Entrance (Entrance C2) of [facility] 30 minutes prior to test start time. You can use the FREE valet parking offered at entrance C (encouraged to control the heart rate for the test)  Proceed to the [facility] Radiology Department (first floor) to check-in and test prep.  All radiology patients and guests should use entrance C2 at [facility], accessed from [facility], even though the hospital's physical address listed is [address]     Please follow these instructions carefully (unless otherwise directed):  On the Night Before the Test: Be sure to Drink plenty of water. Do not consume any caffeinated/decaffeinated beverages or chocolate 12 hours prior to your test. Do not take any antihistamines 12 hours prior to your test. If the patient has contrast allergy:  On the Day of the Test: Drink plenty of water until 1 hour prior to the test. Do not eat any  food 1 hour prior to test. You may take your regular medications prior to the test.  If you take Furosemide/Hydrochlorothiazide/Spironolactone/Chlorthalidone, please HOLD on the morning of the test. Patients who wear a continuous glucose monitor MUST remove the device prior to scanning. FEMALES- please wear underwire-free bra if available, avoid dresses & tight clothing     After the Test: Drink plenty of water. After receiving IV contrast, you may experience a mild flushed feeling. This is normal. On occasion, you may experience a mild rash up to 24 hours after the test. This is not dangerous. If this occurs, you can take Benadryl 25 mg, Zyrtec, Claritin, or Allegra and increase your fluid intake. (Patients taking Tikosyn should avoid Benadryl, and may take Zyrtec, Claritin, or Allegra) If you experience trouble breathing, this can be serious.  If it is severe call 911 IMMEDIATELY. If it is mild, please call our office.  We will call to schedule your test 2-4 weeks out understanding that some insurance companies will need an authorization prior to the service being performed.   For more information and frequently asked questions, please visit our website : [email or web address]  For non-scheduling related questions, please contact the cardiac imaging nurse navigator should you have any questions/concerns: Cardiac Imaging Nurse Navigators Direct Office Dial: [phone]   For scheduling needs, including cancellations and rescheduling, please call [name], [phone].

## 2024-03-18 ENCOUNTER — Ambulatory Visit (HOSPITAL_COMMUNITY)
Admission: RE | Admit: 2024-03-18 | Discharge: 2024-03-18 | Disposition: A | Discharge status: Home / Self Care | Source: Ambulatory Visit | Attending: Family Medicine | Admitting: Family Medicine

## 2024-03-18 ENCOUNTER — Encounter (HOSPITAL_COMMUNITY): Payer: Self-pay

## 2024-03-18 DIAGNOSIS — Z1231 Encounter for screening mammogram for malignant neoplasm of breast: Secondary | ICD-10-CM | POA: Insufficient documentation

## 2024-03-21 ENCOUNTER — Encounter: Payer: Self-pay | Admitting: Family Medicine

## 2024-03-22 ENCOUNTER — Ambulatory Visit (HOSPITAL_COMMUNITY)
Admission: RE | Admit: 2024-03-22 | Discharge: 2024-03-22 | Disposition: A | Discharge status: Home / Self Care | Source: Ambulatory Visit | Attending: Family Medicine | Admitting: Family Medicine

## 2024-03-22 DIAGNOSIS — Z1231 Encounter for screening mammogram for malignant neoplasm of breast: Secondary | ICD-10-CM | POA: Diagnosis not present

## 2024-03-26 ENCOUNTER — Encounter (HOSPITAL_COMMUNITY): Payer: Self-pay

## 2024-03-27 ENCOUNTER — Telehealth (HOSPITAL_COMMUNITY): Payer: Self-pay | Admitting: *Deleted

## 2024-03-27 NOTE — Telephone Encounter (Signed)
 Reaching out to patient to offer assistance regarding upcoming cardiac imaging study; pt verbalizes understanding of appt date/time, parking situation and where to check in, pre-test NPO status and medications ordered, and verified current allergies; name and call back number provided for further questions should they arise Pamela Frame RN Navigator Cardiac Imaging [facility] [phone] office [phone] cell

## 2024-03-28 ENCOUNTER — Telehealth: Payer: Self-pay | Admitting: *Deleted

## 2024-03-28 ENCOUNTER — Ambulatory Visit (HOSPITAL_COMMUNITY)
Admission: RE | Admit: 2024-03-28 | Discharge: 2024-03-28 | Disposition: A | Discharge status: Home / Self Care | Source: Ambulatory Visit | Attending: Student | Admitting: Student

## 2024-03-28 ENCOUNTER — Ambulatory Visit (HOSPITAL_BASED_OUTPATIENT_CLINIC_OR_DEPARTMENT_OTHER)
Admission: RE | Admit: 2024-03-28 | Discharge: 2024-03-28 | Disposition: A | Discharge status: Home / Self Care | Source: Ambulatory Visit | Attending: Cardiovascular Disease | Admitting: Cardiovascular Disease

## 2024-03-28 ENCOUNTER — Other Ambulatory Visit: Payer: Self-pay | Admitting: Cardiovascular Disease

## 2024-03-28 DIAGNOSIS — M25561 Pain in right knee: Secondary | ICD-10-CM | POA: Diagnosis not present

## 2024-03-28 DIAGNOSIS — R001 Bradycardia, unspecified: Secondary | ICD-10-CM | POA: Diagnosis not present

## 2024-03-28 DIAGNOSIS — R931 Abnormal findings on diagnostic imaging of heart and coronary circulation: Secondary | ICD-10-CM

## 2024-03-28 DIAGNOSIS — I2510 Atherosclerotic heart disease of native coronary artery without angina pectoris: Secondary | ICD-10-CM | POA: Insufficient documentation

## 2024-03-28 DIAGNOSIS — Z13220 Encounter for screening for lipoid disorders: Secondary | ICD-10-CM

## 2024-03-28 DIAGNOSIS — M5416 Radiculopathy, lumbar region: Secondary | ICD-10-CM | POA: Diagnosis not present

## 2024-03-28 MED ORDER — ROSUVASTATIN CALCIUM 5 MG PO TABS: 5.0000 mg | ORAL_TABLET | Freq: Every day | ORAL | 3 refills | Status: AC

## 2024-03-28 MED ORDER — NITROGLYCERIN 0.4 MG SL SUBL
SUBLINGUAL_TABLET | SUBLINGUAL | Status: AC
  Filled 2024-03-28: qty 2

## 2024-03-28 MED ORDER — IOHEXOL 350 MG/ML SOLN
95.0000 mL | Freq: Once | INTRAVENOUS | Status: AC | PRN
  Administered 2024-03-28: 95 mL via INTRAVENOUS

## 2024-03-28 MED ORDER — NITROGLYCERIN 0.4 MG SL SUBL
0.8000 mg | SUBLINGUAL_TABLET | Freq: Once | SUBLINGUAL | Status: AC
  Administered 2024-03-28: 0.8 mg via SUBLINGUAL

## 2024-03-28 NOTE — Telephone Encounter (Signed)
 Pt notified and orders placed for labs and Crestor.

## 2024-03-28 NOTE — Telephone Encounter (Signed)
-----   Message from [place] sent at 03/28/2024  1:27 PM EDT ----- Please let the patient know that her Coronary CT scan showed some plaque buildup along her coronary vessels but the maximum was at 25-49%. Usually not causing issues unless greater than 80%. They also performed an additional analysis called FFR which showed the plaque was not limiting blood flow through her coronary arteries.  This would not cause her shortness of breath but we do want to make sure that plaque does not further progress.  Her LDL was at 113 on most recent check and we prefer for this to be less than 70 to prevent further plaque buildup. She is listed as taking Crestor 5 mg MWF. Would try increasing to 5mg  daily and recheck FLP and LFT's in 2-3 months. If intolerant of this due to muscle aches, please make [place] aware and we can consider an alternative.

## 2024-03-29 DIAGNOSIS — R001 Bradycardia, unspecified: Secondary | ICD-10-CM | POA: Diagnosis not present

## 2024-04-04 ENCOUNTER — Ambulatory Visit (HOSPITAL_COMMUNITY)
Admission: RE | Admit: 2024-04-04 | Discharge: 2024-04-04 | Disposition: A | Discharge status: Home / Self Care | Source: Ambulatory Visit | Attending: Family Medicine | Admitting: Family Medicine

## 2024-04-04 DIAGNOSIS — R0609 Other forms of dyspnea: Secondary | ICD-10-CM | POA: Diagnosis not present

## 2024-04-04 LAB — PULMONARY FUNCTION TEST
DL/VA % pred: 89 %
DL/VA: 3.69 ml/min/mmHg/L
DLCO unc % pred: 75 %
DLCO unc: 14.43 ml/min/mmHg
FEF 25-75 Post: 1.47 L/s
FEF 25-75 Pre: 0.75 L/s
FEF2575-%Change-Post: 95 %
FEF2575-%Pred-Post: 85 %
FEF2575-%Pred-Pre: 43 %
FEV1-%Change-Post: 21 %
FEV1-%Pred-Post: 92 %
FEV1-%Pred-Pre: 76 %
FEV1-Post: 1.99 L
FEV1-Pre: 1.63 L
FEV1FVC-%Change-Post: 22 %
FEV1FVC-%Pred-Pre: 78 %
FEV6-%Change-Post: 0 %
FEV6-%Pred-Post: 100 %
FEV6-%Pred-Pre: 101 %
FEV6-Post: 2.73 L
FEV6-Pre: 2.76 L
FEV6FVC-%Pred-Post: 105 %
FEV6FVC-%Pred-Pre: 105 %
FVC-%Change-Post: 0 %
FVC-%Pred-Post: 95 %
FVC-%Pred-Pre: 96 %
FVC-Post: 2.73 L
FVC-Pre: 2.76 L
Post FEV1/FVC ratio: 73 %
Post FEV6/FVC ratio: 100 %
Pre FEV1/FVC ratio: 59 %
Pre FEV6/FVC Ratio: 100 %
RV % pred: 143 %
RV: 3.25 L
TLC % pred: 113 %
TLC: 5.74 L

## 2024-04-04 MED ORDER — ALBUTEROL SULFATE (2.5 MG/3ML) 0.083% IN NEBU
2.5000 mg | INHALATION_SOLUTION | Freq: Once | RESPIRATORY_TRACT | Status: AC
  Administered 2024-04-04: 2.5 mg via RESPIRATORY_TRACT

## 2024-04-07 ENCOUNTER — Encounter: Payer: Self-pay | Admitting: Family Medicine

## 2024-04-08 ENCOUNTER — Other Ambulatory Visit: Payer: Self-pay | Admitting: Family Medicine

## 2024-04-08 MED ORDER — BUDESONIDE-FORMOTEROL FUMARATE 80-4.5 MCG/ACT IN AERO: 2.0000 | INHALATION_SPRAY | Freq: Two times a day (BID) | RESPIRATORY_TRACT | 3 refills | Status: AC

## 2024-05-06 ENCOUNTER — Ambulatory Visit: Payer: Self-pay | Admitting: Cardiology

## 2024-05-06 DIAGNOSIS — R001 Bradycardia, unspecified: Secondary | ICD-10-CM

## 2024-05-15 ENCOUNTER — Encounter (HOSPITAL_COMMUNITY): Payer: Self-pay | Admitting: Internal Medicine

## 2024-05-15 ENCOUNTER — Ambulatory Visit (HOSPITAL_COMMUNITY)
Admission: RE | Admit: 2024-05-15 | Discharge: 2024-05-15 | Disposition: A | Discharge status: Home / Self Care | Payer: Medicare Other | Source: Ambulatory Visit | Attending: Internal Medicine | Admitting: Internal Medicine

## 2024-05-15 VITALS — BP 100/70 | HR 53 | Ht 64.0 in | Wt 106.2 lb

## 2024-05-15 DIAGNOSIS — I480 Paroxysmal atrial fibrillation: Secondary | ICD-10-CM

## 2024-05-15 DIAGNOSIS — Z79899 Other long term (current) drug therapy: Secondary | ICD-10-CM | POA: Diagnosis not present

## 2024-05-15 DIAGNOSIS — Z5181 Encounter for therapeutic drug level monitoring: Secondary | ICD-10-CM | POA: Diagnosis not present

## 2024-05-15 MED ORDER — DOFETILIDE 250 MCG PO CAPS: 250.0000 ug | ORAL_CAPSULE | Freq: Two times a day (BID) | ORAL | 1 refills | Status: DC

## 2024-05-15 NOTE — Progress Notes (Signed)
 Primary Care Physician: Bennet Brasil, MD Primary Cardiologist: Armida Lander, MD Electrophysiologist: Efraim Grange, MD     Referring Physician: Dr. Arnie Bibber Pamela Yang is a 73 y.o. female with a history of GERD, SVT, junctional tachycardia, and atrial flutter who presents for consultation in the [facility].  History of junctional rhythm resolved off of flecainide . Recent ED visits for atrial flutter with RVR and possibly SVT; seen again for same on 9/14 and admitted for Tikosyn  load. Discharged on Tikosyn  250 mcg BID. Patient is on Xarelto  15 mg daily for a CHADS2VASC score of 2.  On follow up 05/15/24, patient is here for Tikosyn  surveillance. She is currently in junctional rhythm. She has overall had low Afib burden since last office visit. No missed doses of Tikosyn  or Xarelto .  Today, she denies symptoms of palpitations, chest pain, shortness of breath, orthopnea, PND, lower extremity edema, dizziness, presyncope, syncope, snoring, daytime somnolence, bleeding, or neurologic sequela. The patient is tolerating medications without difficulties and is otherwise without complaint today.    she has a BMI of Body mass index is 18.23 kg/m.[name] Filed Weights   05/15/24 1030  Weight: 48.2 kg     Current Outpatient Medications  Medication Sig Dispense Refill   Ascorbic Acid (VITAMIN C PO) Take 500 mg by mouth in the morning and at bedtime.     budesonide -formoterol  (SYMBICORT ) 80-4.5 MCG/ACT inhaler Inhale 2 puffs into the lungs 2 (two) times daily. 1 each 3   denosumab  (PROLIA ) 60 MG/ML SOSY injection Inject 60 mg into the skin every 6 (six) months.     dofetilide  (TIKOSYN ) 250 MCG capsule Take 1 capsule (250 mcg total) by mouth 2 (two) times daily. 180 capsule 1   Flaxseed, Linseed, (FLAXSEED OIL) 1200 MG CAPS Take 1,200 mg by mouth 2 (two) times daily.     Multiple Vitamins-Minerals (MULTIVITAMIN WITH MINERALS) tablet Take 1 tablet by mouth  daily.     potassium chloride  (KLOR-CON  M) 10 MEQ tablet Take 1 tablet (10 mEq total) by mouth daily. 90 tablet 2   Propylene Glycol (SYSTANE BALANCE) 0.6 % SOLN Place 2 drops into both eyes in the morning and at bedtime. Into both eyes     rosuvastatin  (CRESTOR ) 5 MG tablet Take 1 tablet (5 mg total) by mouth daily. 90 tablet 3   sertraline  (ZOLOFT ) 50 MG tablet 1 by mouth PO QD 90 tablet 3   XARELTO  15 MG TABS tablet TAKE ONE (1) TABLET BY MOUTH EVERY DAY 90 tablet 1   No current facility-administered medications for this encounter.    Atrial Fibrillation Management history:  Previous antiarrhythmic drugs: flecainide , tikosyn  Previous cardioversions: none Previous ablations: 03/15/23 Anticoagulation history: Xarelto  15   ROS- All systems are reviewed and negative except as per the HPI above.  Physical Exam: BP 100/70   Pulse (!) 53   Ht 5\' 4"  (1.626 m)   Wt 48.2 kg   BMI 18.23 kg/m   GEN- The patient is well appearing, alert and oriented x 3 today.   Neck - no JVD or carotid bruit noted Lungs- Clear to ausculation bilaterally, normal work of breathing Heart- Regular bradycardic rate and rhythm, no murmurs, rubs or gallops, PMI not laterally displaced Extremities- no clubbing, cyanosis, or edema Skin - no rash or ecchymosis noted    EKG today demonstrates  Vent. rate 53 BPM PR interval * ms QRS duration 74 ms QT/QTcB 450/422 ms P-R-T axes *  76 27 Junctional rhythm Anteroseptal infarct , age undetermined Abnormal ECG When compared with ECG of 15-May-2024 10:32, Junctional rhythm has replaced Wide QRS rhythm  Echo 09/04/23 demonstrated   1. Intermittent junctional rhythm.   2. Left ventricular ejection fraction, by estimation, is 60 to 65%. The  left ventricle has normal function. The left ventricle has no regional  wall motion abnormalities. Left ventricular diastolic parameters are  indeterminate.   3. Right ventricular systolic function is normal. The right  ventricular  size is normal. There is normal pulmonary artery systolic pressure.   4. A small pericardial effusion is present.   5. The mitral valve is normal in structure. Trivial mitral valve  regurgitation. No evidence of mitral stenosis.   6. Tricuspid valve regurgitation is moderate.   7. The aortic valve is tricuspid. Aortic valve regurgitation is not  visualized. Aortic valve sclerosis/calcification is present, without any  evidence of aortic stenosis.   8. The inferior vena cava is dilated in size with >50% respiratory  variability, suggesting right atrial pressure of 8 mmHg.    ASSESSMENT & PLAN CHA2DS2-VASc Score = 2  The patient's score is based upon: CHF History: 0 HTN History: 0 Diabetes History: 0 Stroke History: 0 Vascular Disease History: 0 Age Score: 1 Gender Score: 1      ASSESSMENT AND PLAN: Paroxysmal Atrial Flutter The patient's CHA2DS2-VASc score is 2, indicating a 2.2% annual risk of stroke.   S/p Atrial flutter ablation on 03/15/23.  S/p Tikosyn  admission 9/14-18/24.  She is currently in junctional rhythm (she historically has sinus brady vs junctional rhythm noted).  High risk medication monitoring (ICD10: [id number]) Patient requires ongoing monitoring for anti-arrhythmic medication which has the potential to cause life threatening arrhythmias or AV block. Qtc stable. Continue Tikosyn  250 mcg BID. Bmet and mag drawn today.   She wishes to remain on Xarelto  15 mg daily. She will turn 73 on 5/31 and this will change her Chadsvasc score to 3.     Follow up 3 months for Tikosyn  surveillance.   [name], PA-C  Afib Clinic [facility] [address] [place], [place] [address] [phone]

## 2024-05-15 NOTE — Addendum Note (Signed)
 Encounter addended by: Tess Fife, RN on: 05/15/2024 11:01 AM  Actions taken: Order list changed

## 2024-05-16 ENCOUNTER — Ambulatory Visit (HOSPITAL_COMMUNITY): Payer: Self-pay | Admitting: Internal Medicine

## 2024-05-16 LAB — BASIC METABOLIC PANEL WITH GFR
BUN/Creatinine Ratio: 22 (ref 12–28)
BUN: 18 mg/dL (ref 8–27)
CO2: 23 mmol/L (ref 20–29)
Calcium: 10.1 mg/dL (ref 8.7–10.3)
Chloride: 104 mmol/L (ref 96–106)
Creatinine, Ser: 0.81 mg/dL (ref 0.57–1.00)
Glucose: 90 mg/dL (ref 70–99)
Potassium: 4.6 mmol/L (ref 3.5–5.2)
Sodium: 143 mmol/L (ref 134–144)
eGFR: 76 mL/min/{1.73_m2} (ref >59)

## 2024-05-16 LAB — MAGNESIUM: Magnesium: 2.2 mg/dL (ref 1.6–2.3)

## 2024-05-20 DIAGNOSIS — K080 Exfoliation of teeth due to systemic causes: Secondary | ICD-10-CM | POA: Diagnosis not present

## 2024-05-30 ENCOUNTER — Other Ambulatory Visit: Payer: Self-pay | Admitting: Cardiology

## 2024-05-30 DIAGNOSIS — I483 Typical atrial flutter: Secondary | ICD-10-CM

## 2024-05-30 DIAGNOSIS — I480 Paroxysmal atrial fibrillation: Secondary | ICD-10-CM

## 2024-05-30 NOTE — Telephone Encounter (Signed)
 Prescription refill request for Xarelto  received.  Indication: afib  Last office visit: Pamela Yang, 05/15/2024 Weight: 48.2 kg  Age: 74 yo  Scr:  0.81, 05/15/2024 CrCl: 46 ml/min   Refill sent.

## 2024-06-18 DIAGNOSIS — Z13220 Encounter for screening for lipoid disorders: Secondary | ICD-10-CM | POA: Diagnosis not present

## 2024-06-19 ENCOUNTER — Ambulatory Visit: Payer: Self-pay | Admitting: Student

## 2024-06-19 DIAGNOSIS — H04123 Dry eye syndrome of bilateral lacrimal glands: Secondary | ICD-10-CM | POA: Diagnosis not present

## 2024-06-19 DIAGNOSIS — H43813 Vitreous degeneration, bilateral: Secondary | ICD-10-CM | POA: Diagnosis not present

## 2024-06-19 DIAGNOSIS — Z961 Presence of intraocular lens: Secondary | ICD-10-CM | POA: Diagnosis not present

## 2024-06-19 DIAGNOSIS — H16143 Punctate keratitis, bilateral: Secondary | ICD-10-CM | POA: Diagnosis not present

## 2024-06-19 LAB — HEPATIC FUNCTION PANEL
ALT: 23 IU/L (ref 0–32)
AST: 26 IU/L (ref 0–40)
Albumin: 4.5 g/dL (ref 3.8–4.8)
Alkaline Phosphatase: 77 IU/L (ref 44–121)
Bilirubin Total: 0.4 mg/dL (ref 0.0–1.2)
Bilirubin, Direct: 0.17 mg/dL (ref 0.00–0.40)
Total Protein: 6.5 g/dL (ref 6.0–8.5)

## 2024-06-19 LAB — LIPID PANEL
Chol/HDL Ratio: 2.2 ratio (ref 0.0–4.4)
Cholesterol, Total: 167 mg/dL (ref 100–199)
HDL: 75 mg/dL (ref >39)
LDL Chol Calc (NIH): 81 mg/dL (ref 0–99)
Triglycerides: 51 mg/dL (ref 0–149)
VLDL Cholesterol Cal: 11 mg/dL (ref 5–40)

## 2024-06-29 NOTE — Progress Notes (Unsigned)
 Cardiology Office Note    Date:  07/02/2024  ID:  Pamela Yang, Pamela Yang April 09, 1951, MRN [id number] Cardiologist: Alvan Carrier, MD   EP: Dr. Nancey  History of Present Illness:    Pamela Yang is a 73 y.o. female with past medical history of paroxysmal atrial fibrillation complicated by junctional bradycardia, osteoporosis and family history of CAD who presents to the office today for 28-month follow-up.   She was examined by myself in 02/2024 and reported having worsening dyspnea on exertion for the past few months. Denied any exertional chest pain. Given her cardiac risk factors, a Coronary CTA was recommended for further assessment and a 7-day Zio patch to assess HR rhythm and response. Coronary CTA showed 25-49% LM plaque, 25-49% mid-LAD plaque, 25-49% dRCA plaque and 1-24% plaque elsewhere. FFR was normal suggesting LM and RCA stenoses were not hemodynamically significant. Was recommended to increase Crestor  from 5mg  MWF to 5mg  daily. Event monitor showed NSR with average HR of 55 bpm. She did have 26 episodes of SVT with the longest lasting 14.5 seconds and noted to have junctional rhythm at times and rare PAC's and PVC's.  She did follow-up with the Atrial Fibrillation Clinic on 05/15/2024 and was in junctional rhythm at that time. No changes were made and she was continued on Xarelto  15mg  daily and Tikosyn  250 mcg BID.   In talking with the patient today, she reports overall feeling well since her last office visit. Denies any chest pain or palpitations. Reports having shortness of breath with inclines but no acute changes in this. Uses Symbicort  but she has not noticed that this helps with symptoms. No specific orthopnea, PND or pitting edema. Remains on Xarelto  with no reports of active bleeding. She did increase her Crestor  to daily dosing with no change in myalgias. Reports having shoulder pain at times but feels like this is due to arthritis. She is planning to go to the beach next  month and visit her daughter.  Studies Reviewed:   EKG: EKG is not ordered today.  Coronary CTA: 03/2024 FINDINGS: Non-cardiac: See separate report from [facility]. No significant findings on limited lung and soft tissue windows.   Calcium  Score: LM and 3 vessel calcium  noted   LM: 153   LAD 79.4   LCX 17.9   RCA 59.2   Total: 309   Coronary Arteries: Right dominant with no anomalies   LM: 25-49% calcified ostial plaque   LAD: 1-24% calcified plaque ostial/proximal vessel, 25-49% mixed plaque in mid vessel, 1-24% soft plaque in distal vessel   D1: Normal   Circumflex: 1-24% calcified plaque in mid vessel   OM1: Normal   OM2: Normal   RCA: 1-24% calcified plaque in proximal vessel, 25-49% mixed plaque in distal vessel   PDA: Normal   PLA: Normal   IMPRESSION: 1. LM and 3 vessel calcium  with score 309 which is 81 st percentile for age/sex   2.  Normal diameter ascending thoracic aorta 3.4 cm   3. CAD RADS 2 non obstructive CAD see description above. Study sent for FFR CT given LM calcium    4. Total plaque volume 269 mm3 of which 87% was non calcified plaque  FINDINGS: FFR CT is normal   LAD: Proximal 0.94, mid 0.92, distal 0.90   LCX: Proximal 0.95, distal 0.95   RCA: Proximal 0.99, mid 0.96 distal 0.95   IMPRESSION: Normal FFR CT suggesting that LM and distal RCA stenosis not hemodynamically significant  Event Monitor: 04/2024  7 day monitor   Rare suparventricular ectopy in the form of isolated PACs, couplets, triplets. 26 runs of SVT, longest 14.5 seconds.   Rare ventricular ectopy in the form of isolated PVCs, couplets.   Symptoms correlated with sinus rhythm and rare PVCs.     Patch Wear Time:  7 days and 0 hours (2025-03-27T13:52:00-0400 to 2025-04-03T13:52:46-0400)   Patient had a min HR of 37 bpm, max HR of 156 bpm, and avg HR of 55 bpm. Predominant underlying rhythm was Sinus Rhythm. 26 Supraventricular Tachycardia runs  occurred, the run with the fastest interval lasting 4 beats with a max rate of 156 bpm, the  longest lasting 14.5 secs with an avg rate of 96 bpm. Junctional Rhythm was present. Junctional Rhythm was detected within +/- 45 seconds of symptomatic patient event(s). Isolated SVEs were rare (<1.0%), SVE Couplets were rare (<1.0%), and SVE Triplets  were rare (<1.0%). Isolated VEs were rare (<1.0%), VE Couplets were rare (<1.0%), and no VE Triplets were present. Ventricular Bigeminy was present.   Risk Assessment/Calculations:    CHA2DS2-VASc Score = 2   This indicates a 2.2% annual risk of stroke. The patient's score is based upon: CHF History: 0 HTN History: 0 Diabetes History: 0 Stroke History: 0 Vascular Disease History: 0 Age Score: 1 Gender Score: 1    Physical Exam:   VS:  BP 135/71 (BP Location: Left Arm, Cuff Size: Normal)   Pulse (!) 48   Ht 5' 4 (1.626 m)   Wt 107 lb (48.5 kg)   SpO2 99%   BMI 18.37 kg/m    Wt Readings from Last 3 Encounters:  07/02/24 107 lb (48.5 kg)  05/15/24 106 lb 3.2 oz (48.2 kg)  03/14/24 106 lb 9.6 oz (48.4 kg)     GEN: Well nourished, well developed female appearing in no acute distress NECK: No JVD; No carotid bruits CARDIAC: Regular rhythm, bradycardiac rate, no murmurs, rubs, gallops RESPIRATORY:  Clear to auscultation without rales, wheezing or rhonchi  ABDOMEN: Appears non-distended. No obvious abdominal masses. EXTREMITIES: No clubbing or cyanosis. No pitting edema.  Distal pedal pulses are 2+ bilaterally.   Assessment and Plan:   1. PAF (paroxysmal atrial fibrillation) (HCC) - She denies any recent palpitations and is in normal sinus rhythm by examination today. Continue Tikosyn  250 mcg twice daily. Labs in 04/2024 showed her K+ was stable at 4.6 and Mg at 2.2. - Continue Xarelto  15 mg daily for anticoagulation which is the appropriate dose given her calculated creatinine clearance of 46 mL/min.   2. Junctional bradycardia -  Denies any recent lightheadedness, dizziness or presyncope. No longer on AV nodal blocking agents. Previously not felt to require PPM placement per EP.  3. Coronary artery disease involving native heart without angina pectoris, unspecified vessel or lesion type - Recent Coronary CTA showed 25-49% LM plaque, 25-49% mid-LAD plaque, 25-49% dRCA plaque and 1-24% plaque elsewhere. FFR was normal suggesting LM and RCA stenoses were not hemodynamically significant.  - She denies any recent anginal symptoms. She is not on ASA given the need for anticoagulation. Continue statin therapy as outlined below.  4. Hyperlipidemia LDL goal <70 - Recent FLP showed her LDL was 81. Reviewed options in regards to medical therapy and she will try increasing Crestor  to 5 mg daily alternating with 10 mg daily. If able to tolerate without any change in myalgias, would go to 10 mg daily with repeat FLP and LFT's in 3 months. She plans to reach out to us   within the next 1 to 2 weeks in regards to how she is feeling with the medication adjustment.  Signed, Laymon CHRISTELLA Qua, PA-C

## 2024-07-02 ENCOUNTER — Encounter: Payer: Self-pay | Admitting: Student

## 2024-07-02 ENCOUNTER — Ambulatory Visit: Attending: Student | Admitting: Student

## 2024-07-02 VITALS — BP 135/71 | HR 48 | Ht 64.0 in | Wt 107.0 lb

## 2024-07-02 DIAGNOSIS — I480 Paroxysmal atrial fibrillation: Secondary | ICD-10-CM | POA: Diagnosis not present

## 2024-07-02 DIAGNOSIS — I2510 Atherosclerotic heart disease of native coronary artery without angina pectoris: Secondary | ICD-10-CM | POA: Diagnosis not present

## 2024-07-02 DIAGNOSIS — E785 Hyperlipidemia, unspecified: Secondary | ICD-10-CM

## 2024-07-02 DIAGNOSIS — R001 Bradycardia, unspecified: Secondary | ICD-10-CM

## 2024-07-02 NOTE — Patient Instructions (Addendum)
 Medication Instructions:   INCREASE Crestor  to 5 mg one day,alternating with 10 mg  Call the office with update and we will send new prescription if you tolerate this doing.  Labwork: None today  Testing/Procedures: None today  Follow-Up: 6 months with Dr.Branch or [name] Strader,PA-C  Any Other Special Instructions Will Be Listed Below (If Applicable).  If you need a refill on your cardiac medications before your next appointment, please call your pharmacy.

## 2024-07-11 DIAGNOSIS — H16143 Punctate keratitis, bilateral: Secondary | ICD-10-CM | POA: Diagnosis not present

## 2024-07-24 ENCOUNTER — Telehealth: Payer: Self-pay | Admitting: Cardiology

## 2024-07-24 ENCOUNTER — Ambulatory Visit: Attending: Cardiology | Admitting: *Deleted

## 2024-07-24 VITALS — BP 126/80 | HR 79 | Ht 64.0 in | Wt 105.2 lb

## 2024-07-24 DIAGNOSIS — I4891 Unspecified atrial fibrillation: Secondary | ICD-10-CM

## 2024-07-24 MED ORDER — METOPROLOL TARTRATE 25 MG PO TABS: 25.0000 mg | ORAL_TABLET | Freq: Two times a day (BID) | ORAL | 3 refills | Status: AC | PRN

## 2024-07-24 NOTE — Telephone Encounter (Signed)
 Patient called the during service this morning because she is back in atrial fibrillation.  Reports that yesterday afternoon she started feeling an irregular heartbeat and she continues to feel that this morning.  Heart rate has ranged from the 60s-90s.  Her blood pressure is elevated, into the 130s-140s systolic.  Denies dizziness, syncope, near syncope.  Patient reports that she was previously on metoprolol  and Dr. Nancey had mentioned that she could take a dose of metoprolol  for palpitations/atrial fibrillation.  She has been compliant with her Tikosyn  and Xarelto   Currently, heart rate 97 bpm.  Blood pressure 145/103.  Reports having metoprolol  25 mg daily.  Advised her to take 1 dose of metoprolol  this AM to see if symptoms improve.  Instructed her to keep a close eye on her heart rate and to not take metoprolol  if her heart rate is in the 60s or below.  Forwarding this message to Dr. Nancey and Dr. Alvan for further recommendations. She has an appointment with the afib clinic on 8/26   Rollo FABIENE Louder, PA-C 07/24/2024 7:08 AM

## 2024-07-24 NOTE — Patient Instructions (Signed)
 Take Metoprolol  as needed for palpitations. Would check vitals before taking Metoprolol  and only take if heart rate is greater 55 beats per minute and systolic blood pressure (top number) is greater than 100.

## 2024-07-25 NOTE — Progress Notes (Signed)
    Reviewed EKG with Dr. Alvan and felt to be most consistent with wandering atrial pacemaker and PAC's that are not conducted at times. Continue with Xarelto  and Tikosyn . We reviewed she could still take Lopressor  25mg  PRN for palpitations or tachycardia but would not use daily given her history of junctional rhythms and baseline bradycardia. Was encouraged to check HR and BP prior to utilizing Metoprolol . She does have follow-up with the [facility]illation [facility] later this month and will keep that appointment.   Signed, Laymon CHRISTELLA Qua, PA-C 07/25/2024, 8:00 AM Pager: [phone]

## 2024-08-08 DIAGNOSIS — M810 Age-related osteoporosis without current pathological fracture: Secondary | ICD-10-CM | POA: Diagnosis not present

## 2024-08-15 ENCOUNTER — Encounter (HOSPITAL_COMMUNITY): Payer: Self-pay | Admitting: Internal Medicine

## 2024-08-15 ENCOUNTER — Ambulatory Visit (HOSPITAL_COMMUNITY)
Admission: RE | Admit: 2024-08-15 | Discharge: 2024-08-15 | Disposition: A | Discharge status: Home / Self Care | Source: Ambulatory Visit | Attending: Internal Medicine | Admitting: Internal Medicine

## 2024-08-15 VITALS — BP 128/78 | HR 48 | Ht 64.0 in | Wt 110.0 lb

## 2024-08-15 DIAGNOSIS — I480 Paroxysmal atrial fibrillation: Secondary | ICD-10-CM

## 2024-08-15 DIAGNOSIS — M810 Age-related osteoporosis without current pathological fracture: Secondary | ICD-10-CM | POA: Diagnosis not present

## 2024-08-15 DIAGNOSIS — Z5181 Encounter for therapeutic drug level monitoring: Secondary | ICD-10-CM | POA: Diagnosis not present

## 2024-08-15 DIAGNOSIS — D6869 Other thrombophilia: Secondary | ICD-10-CM | POA: Diagnosis not present

## 2024-08-15 DIAGNOSIS — Z79899 Other long term (current) drug therapy: Secondary | ICD-10-CM

## 2024-08-15 NOTE — Progress Notes (Signed)
 Primary Care Physician: Alphonsa Glendia LABOR, MD Primary Cardiologist: Alvan Carrier, MD Electrophysiologist: Eulas FORBES Furbish, MD     Referring Physician: Dr. Furbish Pulling Pamela Yang is a 73 y.o. female with a history of GERD, SVT, junctional tachycardia, and atrial flutter who presents for consultation in the [facility].  History of junctional rhythm resolved off of flecainide . Recent ED visits for atrial flutter with RVR and possibly SVT; seen again for same on 9/14 and admitted for Tikosyn  load. Discharged on Tikosyn  250 mcg BID. Patient is on Xarelto  15 mg daily for a CHADS2VASC score of 3.  On follow up 08/15/24, patient is here for Tikosyn  surveillance. She is currently in junctional bradycardia. History of asymptomatic junctional rhythm. Patient noted Afib episode on 8/6; ECG done on 8/6 showed wandering atrial pacemaker with PACs. She has not had any episodes of Afib since then and overall very low burden. No missed doses of Tikosyn  or Xarelto .   Today, she denies symptoms of palpitations, chest pain, shortness of breath, orthopnea, PND, lower extremity edema, dizziness, presyncope, syncope, snoring, daytime somnolence, bleeding, or neurologic sequela. The patient is tolerating medications without difficulties and is otherwise without complaint today.    she has a BMI of Body mass index is 18.88 kg/m.SABRA Filed Weights   08/15/24 1056  Weight: 49.9 kg    Current Outpatient Medications  Medication Sig Dispense Refill   Ascorbic Acid (VITAMIN C PO) Take 500 mg by mouth in the morning and at bedtime.     budesonide -formoterol  (SYMBICORT ) 80-4.5 MCG/ACT inhaler Inhale 2 puffs into the lungs 2 (two) times daily. 1 each 3   denosumab  (PROLIA ) 60 MG/ML SOSY injection Inject 60 mg into the skin every 6 (six) months.     dofetilide  (TIKOSYN ) 250 MCG capsule Take 1 capsule (250 mcg total) by mouth 2 (two) times daily. 180 capsule 1   Flaxseed, Linseed,  (FLAXSEED OIL) 1200 MG CAPS Take 1,200 mg by mouth 2 (two) times daily.     metoprolol  tartrate (LOPRESSOR ) 25 MG tablet Take 1 tablet (25 mg total) by mouth 2 (two) times daily as needed (Palpitations and elevated heart rate). 60 tablet 3   Multiple Vitamins-Minerals (MULTIVITAMIN WITH MINERALS) tablet Take 1 tablet by mouth daily.     potassium chloride  (KLOR-CON  M) 10 MEQ tablet Take 1 tablet (10 mEq total) by mouth daily. 90 tablet 2   Propylene Glycol (SYSTANE BALANCE) 0.6 % SOLN Place 2 drops into both eyes in the morning and at bedtime. Into both eyes     rosuvastatin  (CRESTOR ) 5 MG tablet Take 1 tablet (5 mg total) by mouth daily. 90 tablet 3   sertraline  (ZOLOFT ) 50 MG tablet 1 by mouth PO QD 90 tablet 3   XARELTO  15 MG TABS tablet TAKE ONE (1) TABLET BY MOUTH EVERY DAY 90 tablet 1   No current facility-administered medications for this encounter.    Atrial Fibrillation Management history:  Previous antiarrhythmic drugs: flecainide , tikosyn  Previous cardioversions: none Previous ablations: 03/15/23 Anticoagulation history: Xarelto  15   ROS- All systems are reviewed and negative except as per the HPI above.  Physical Exam: BP 128/78   Pulse (!) 48   Ht 5' 4 (1.626 m)   Wt 49.9 kg   BMI 18.88 kg/m   GEN- The patient is well appearing, alert and oriented x 3 today.   Neck - no JVD or carotid bruit noted Lungs- Clear to ausculation bilaterally, normal work  of breathing Heart- Regular bradycardic rate and rhythm, no murmurs, rubs or gallops, PMI not laterally displaced Extremities- no clubbing, cyanosis, or edema Skin - no rash or ecchymosis noted   EKG today demonstrates  Vent. rate 48 BPM PR interval * ms QRS duration 90 ms QT/QTcB 504/450 ms P-R-T axes 71 75 46 Junctional bradycardia Possible Anteroseptal infarct (cited on or before 15-May-2024) Abnormal ECG When compared with ECG of 24-Jul-2024 16:04, PREVIOUS ECG IS PRESENT Confirmed by Terra Pac ([id number])  on 08/15/2024 11:10:07 AM  Echo 09/04/23 demonstrated   1. Intermittent junctional rhythm.   2. Left ventricular ejection fraction, by estimation, is 60 to 65%. The  left ventricle has normal function. The left ventricle has no regional  wall motion abnormalities. Left ventricular diastolic parameters are  indeterminate.   3. Right ventricular systolic function is normal. The right ventricular  size is normal. There is normal pulmonary artery systolic pressure.   4. A small pericardial effusion is present.   5. The mitral valve is normal in structure. Trivial mitral valve  regurgitation. No evidence of mitral stenosis.   6. Tricuspid valve regurgitation is moderate.   7. The aortic valve is tricuspid. Aortic valve regurgitation is not  visualized. Aortic valve sclerosis/calcification is present, without any  evidence of aortic stenosis.   8. The inferior vena cava is dilated in size with >50% respiratory  variability, suggesting right atrial pressure of 8 mmHg.   CHA2DS2-VASc Score = 3  The patient's score is based upon: CHF History: 0 HTN History: 0 Diabetes History: 0 Stroke History: 0 Vascular Disease History: 0 Age Score: 2 Gender Score: 1       ASSESSMENT AND PLAN: Paroxysmal Atrial Flutter The patient's CHA2DS2-VASc score is 3, indicating a 3.2% annual risk of stroke.   S/p Atrial flutter ablation on 03/15/23.  S/p Tikosyn  admission 9/14-18/24.  She is currently in junctional rhythm. She is overall happy with current management. Will not make any changes at this time. Continue Xarelto  15 mg daily.   High risk medication monitoring (ICD10: [id number]) Patient requires ongoing monitoring for anti-arrhythmic medication which has the potential to cause life threatening arrhythmias or AV block. Qtc stable. Continue Tikosyn  250 mcg BID. Bmet and mag drawn today.     Follow up 6 months for Tikosyn  surveillance.   Terra Pac, PA-C  [facility] [address] [place], [place] [address] [phone]

## 2024-08-16 ENCOUNTER — Ambulatory Visit (HOSPITAL_COMMUNITY): Payer: Self-pay | Admitting: Internal Medicine

## 2024-08-16 LAB — BASIC METABOLIC PANEL WITH GFR
BUN/Creatinine Ratio: 25 (ref 12–28)
BUN: 20 mg/dL (ref 8–27)
CO2: 23 mmol/L (ref 20–29)
Calcium: 10.6 mg/dL — ABNORMAL HIGH (ref 8.7–10.3)
Chloride: 103 mmol/L (ref 96–106)
Creatinine, Ser: 0.79 mg/dL (ref 0.57–1.00)
Glucose: 115 mg/dL — ABNORMAL HIGH (ref 70–99)
Potassium: 4.5 mmol/L (ref 3.5–5.2)
Sodium: 141 mmol/L (ref 134–144)
eGFR: 78 mL/min/1.73 (ref >59)

## 2024-08-16 LAB — MAGNESIUM: Magnesium: 2.2 mg/dL (ref 1.6–2.3)

## 2024-10-10 DIAGNOSIS — Z9849 Cataract extraction status, unspecified eye: Secondary | ICD-10-CM | POA: Diagnosis not present

## 2024-10-10 DIAGNOSIS — D6869 Other thrombophilia: Secondary | ICD-10-CM | POA: Diagnosis not present

## 2024-11-10 ENCOUNTER — Other Ambulatory Visit (HOSPITAL_COMMUNITY): Payer: Self-pay | Admitting: Internal Medicine

## 2024-11-21 DIAGNOSIS — K080 Exfoliation of teeth due to systemic causes: Secondary | ICD-10-CM | POA: Diagnosis not present

## 2024-11-26 ENCOUNTER — Other Ambulatory Visit: Payer: Self-pay | Admitting: Cardiology

## 2024-11-26 DIAGNOSIS — I480 Paroxysmal atrial fibrillation: Secondary | ICD-10-CM

## 2024-11-26 DIAGNOSIS — I483 Typical atrial flutter: Secondary | ICD-10-CM

## 2024-11-26 NOTE — Telephone Encounter (Signed)
 Xarelto  15mg  refill request received. Pt is 73 years old, weight-49.9kg, Crea-0.79 on 08/15/24, last seen by Fairy Heinrich on 08/15/24, Diagnosis-Aflutter, CrCl-48.47 mL/min; Dose is appropriate based on dosing criteria. Will send in refill to requested pharmacy.

## 2024-12-29 NOTE — Progress Notes (Unsigned)
 "  Cardiology Office Note    Date:  12/31/2024  ID:  Pamela Yang, Pamela Yang [birth year], MRN [id number] Cardiologist: Alvan Carrier, MD Cardiology APP:  Johnson Laymon CHRISTELLA, PA-C  Electrophysiologist:  Eulas FORBES Furbish, MD { :  History of Present Illness:    Pamela Yang is a 74 y.o. female with past medical history of paroxysmal atrial fibrillation, junctional bradycardia, CAD (s/p Coronary CTA in 03/2024 showing moderate CAD but normal FFR with medical management recommended) and HLD who presents to the office today for 71-month follow-up.  She was examined by myself in 06/2024 and reported overall feeling well and denied any recent chest pain or palpitations. She did have baseline dyspnea on exertion but no acute changes in this. She was continued on Tikosyn  250 mcg twice daily along with Xarelto  15 mg daily. Her LDL was recently at 81 and it was recommend that she try increasing Crestor  to 5 mg daily alternating with 10 mg daily and if able to tolerate without any change of myalgias, could go to 10 mg daily.  She did follow-up with the Atrial Fibrillation Clinic in 07/2024 and was in junctional bradycardia. She felt well at the time, therefore no further changes were made.  In talking with the patient today, she reports overall doing well since her last office visit. She denies any recent chest pain, palpitations or dyspnea on exertion. She participates in exercise classes at the [facility] several days a week and also walks her dogs for exercise. No recent symptoms with this. She reports having some fatigue in the morning hours which occurs sporadically but no persistent symptoms. No recent orthopnea, PND or pitting edema. She does report occasional rectal bleeding which she feels is due to hemorrhoids and reports this is sporadic as well.  Studies Reviewed:   EKG: EKG is not ordered today.  Coronary CTA: 03/2024 IMPRESSION: 1. LM and 3 vessel calcium  with score 309 which is 81 st  percentile for age/sex   2.  Normal diameter ascending thoracic aorta 3.4 cm   3. CAD RADS 2 non obstructive CAD see description above. Study sent for FFR CT given LM calcium    4. Total plaque volume 269 mm3 of which 87% was non calcified plaque  FINDINGS: FFR CT is normal   LAD: Proximal 0.94, mid 0.92, distal 0.90   LCX: Proximal 0.95, distal 0.95   RCA: Proximal 0.99, mid 0.96 distal 0.95   IMPRESSION: Normal FFR CT suggesting that LM and distal RCA stenosis not hemodynamically significant  Event Monitor: 03/2024   7 day monitor   Rare suparventricular ectopy in the form of isolated PACs, couplets, triplets. 26 runs of SVT, longest 14.5 seconds.   Rare ventricular ectopy in the form of isolated PVCs, couplets.   Symptoms correlated with sinus rhythm and rare PVCs.     Patch Wear Time:  7 days and 0 hours (2025-03-27T13:52:00-0400 to 2025-04-03T13:52:46-0400)   Patient had a min HR of 37 bpm, max HR of 156 bpm, and avg HR of 55 bpm. Predominant underlying rhythm was Sinus Rhythm. 26 Supraventricular Tachycardia runs occurred, the run with the fastest interval lasting 4 beats with a max rate of 156 bpm, the  longest lasting 14.5 secs with an avg rate of 96 bpm. Junctional Rhythm was present. Junctional Rhythm was detected within +/- 45 seconds of symptomatic patient event(s). Isolated SVEs were rare (<1.0%), SVE Couplets were rare (<1.0%), and SVE Triplets  were rare (<1.0%). Isolated VEs were rare (<1.0%), VE Couplets  were rare (<1.0%), and no VE Triplets were present. Ventricular Bigeminy was present.   Risk Assessment/Calculations:    CHA2DS2-VASc Score = 4   This indicates a 4.8% annual risk of stroke. The patient's score is based upon: CHF History: 0 HTN History: 0 Diabetes History: 0 Stroke History: 0 Vascular Disease History: 1 Age Score: 2 Gender Score: 1    Physical Exam:   VS:  BP 102/64 (BP Location: Left Arm, Cuff Size: Normal)   Pulse (!) 52   Ht  5' 4 (1.626 m)   Wt 107 lb 12.8 oz (48.9 kg)   BMI 18.50 kg/m    Wt Readings from Last 3 Encounters:  12/31/24 107 lb 12.8 oz (48.9 kg)  08/15/24 110 lb (49.9 kg)  07/24/24 105 lb 3.2 oz (47.7 kg)     GEN: Pleasant female appearing in no acute distress NECK: No JVD; No carotid bruits CARDIAC: Regular rhythm, bradycardiac rate. No murmurs, rubs, gallops RESPIRATORY:  Clear to auscultation without rales, wheezing or rhonchi  ABDOMEN: Appears non-distended. No obvious abdominal masses. EXTREMITIES: No clubbing or cyanosis. No pitting edema.  Distal pedal pulses are 2+ bilaterally.   Assessment and Plan:   1. Paroxysmal atrial fibrillation (HCC) - She denies any recent palpitations and is in sinus rhythm by examination today with a bradycardic rate.  Denies any associated lightheadedness, dizziness or presyncope. She has been continued on Lopressor  25 mg twice daily as she had episodes of wandering atrial pacemaker in the past. - Remains on Tikosyn  250 mcg twice daily and labs in 07/2024 showed her magnesium  was stable at 2.2 with K+ at 4.5. Will recheck a BMET and Mg with upcoming labs.  - Continue Xarelto  15 mg daily for anticoagulation which is the correct dose given her calculated CrCl of 47 mL/min. Will recheck a CBC with upcoming labs given intermittent rectal bleeding which she suspects is secondary to hemorrhoids.  2. Junctional bradycardia - She is followed by the Atrial Fibrillation Clinic along with the EP and not felt to require PPM placement.  Denies any recent lightheadedness, dizziness or presyncope. Has follow-up with EP in 02/2025.  3. Coronary artery disease involving native coronary artery of native heart without angina pectoris/HLD - Coronary CTA in 03/2024 showed scattered 25 to 49% stenosis but not significant by FFR.  She denies any recent anginal symptoms. - LDL was at previously at 81 when checked last year and she has been on Crestror 5mg  daily.  Will recheck an  FLP with upcoming labs. If LDL remains above goal of 70, would titrate Crestor  to 10 mg daily.  Signed, Laymon CHRISTELLA Qua, PA-C   "

## 2024-12-31 ENCOUNTER — Encounter: Payer: Self-pay | Admitting: Student

## 2024-12-31 ENCOUNTER — Ambulatory Visit: Attending: Student | Admitting: Student

## 2024-12-31 VITALS — BP 102/64 | HR 52 | Ht 64.0 in | Wt 107.8 lb

## 2024-12-31 DIAGNOSIS — E785 Hyperlipidemia, unspecified: Secondary | ICD-10-CM | POA: Diagnosis not present

## 2024-12-31 DIAGNOSIS — I2510 Atherosclerotic heart disease of native coronary artery without angina pectoris: Secondary | ICD-10-CM | POA: Diagnosis not present

## 2024-12-31 DIAGNOSIS — R001 Bradycardia, unspecified: Secondary | ICD-10-CM

## 2024-12-31 DIAGNOSIS — I480 Paroxysmal atrial fibrillation: Secondary | ICD-10-CM | POA: Diagnosis not present

## 2024-12-31 NOTE — Patient Instructions (Signed)
 Medication Instructions:   Continue current medication regimen.  *If you need a refill on your cardiac medications before your next appointment, please call your pharmacy*  Lab Work:  Labs at [facility] this month or in 01/2025.  If you have labs (blood work) drawn today and your tests are completely normal, you will receive your results only by: MyChart Message (if you have MyChart) OR A paper copy in the mail If you have any lab test that is abnormal or we need to change your treatment, we will call you to review the results.  Follow-Up: At [facility], you and your health needs are our priority.  As part of our continuing mission to provide you with exceptional heart care, our providers are all part of one team.  This team includes your primary Cardiologist (physician) and Advanced Practice Providers or APPs (Physician Assistants and Nurse Practitioners) who all work together to provide you with the care you need, when you need it.  Your next appointment:   6 month(s)  Provider:   You may see Alvan Carrier, MD or one of the following Advanced Practice Providers on your designated Care Team:   Laymon Qua, PA-C  [place], [place] Olivia Pavy, [place]     We recommend signing up for the patient portal called MyChart.  Sign up information is provided on this After Visit Summary.  MyChart is used to connect with patients for Virtual Visits (Telemedicine).  Patients are able to view lab/test results, encounter notes, upcoming appointments, etc.  Non-urgent messages can be sent to your provider as well.   To learn more about what you can do with MyChart, go to [email or web address].   Other Instructions

## 2025-01-01 ENCOUNTER — Encounter: Payer: Self-pay | Admitting: Nurse Practitioner

## 2025-01-02 ENCOUNTER — Other Ambulatory Visit: Payer: Self-pay | Admitting: Nurse Practitioner

## 2025-01-10 ENCOUNTER — Ambulatory Visit: Payer: Medicare Other

## 2025-01-10 VITALS — Ht 64.0 in | Wt 107.0 lb

## 2025-01-10 DIAGNOSIS — Z0000 Encounter for general adult medical examination without abnormal findings: Secondary | ICD-10-CM

## 2025-01-10 NOTE — Patient Instructions (Signed)
 Pamela Yang,  Thank you for taking the time for your Medicare Wellness Visit. I appreciate your continued commitment to your health goals. Please review the care plan we discussed, and feel free to reach out if I can assist you further.  Please note that Annual Wellness Visits do not include a physical exam. Some assessments may be limited, especially if the visit was conducted virtually. If needed, we may recommend an in-person follow-up with your provider.  Ongoing Care Seeing your primary care provider every 3 to 6 months helps us  monitor your health and provide consistent, personalized care.   Referrals If a referral was made during today's visit and you haven't received any updates within two weeks, please contact the referred provider directly to check on the status.  Recommended Screenings:  Health Maintenance  Topic Date Due   COVID-19 Vaccine (6 - 2025-26 season) 08/19/2024   Breast Cancer Screening  03/22/2025   Osteoporosis screening with Bone Density Scan  08/19/2025   Medicare Annual Wellness Visit  01/10/2026   DTaP/Tdap/Td vaccine (2 - Td or Tdap) 01/27/2033   Pneumococcal Vaccine for age over 81  Completed   Flu Shot  Completed   Hepatitis C Screening  Completed   Zoster (Shingles) Vaccine  Completed   Meningitis B Vaccine  Aged Out   Colon Cancer Screening  Discontinued       01/07/2025    9:00 AM  Advanced Directives  Does Patient Have a Medical Advance Directive? No  Would patient like information on creating a medical advance directive? Yes (MAU/Ambulatory/Procedural Areas - Information given)   Information on Advanced Care Planning can be found at [place]  Secretary of [facility] Advance Health Care Directives Advance Health Care Directives ([email or web address]   Vision: Annual vision screenings are recommended for early detection of glaucoma, cataracts, and diabetic retinopathy. These exams can also reveal signs of chronic conditions such as diabetes and high blood  pressure.  Dental: Annual dental screenings help detect early signs of oral cancer, gum disease, and other conditions linked to overall health, including heart disease and diabetes.  Please see the attached documents for additional preventive care recommendations.

## 2025-01-10 NOTE — Progress Notes (Signed)
 "  Chief Complaint  Patient presents with   Medicare Wellness     Subjective:   Pamela Yang is a 74 y.o. female who presents for a Medicare Annual Wellness Visit.  Visit info / Clinical Intake: Medicare Wellness Visit Type:: Subsequent Annual Wellness Visit Persons participating in visit and providing information:: patient Medicare Wellness Visit Mode:: Video Since this visit was completed virtually, some vitals may be partially provided or unavailable. Missing vitals are due to the limitations of the virtual format.: Documented vitals are patient reported If Telephone or Video please confirm:: I connected with patient using audio/video enable telemedicine. I verified patient identity with two identifiers, discussed telehealth limitations, and patient agreed to proceed. Patient Location:: home Provider Location:: office Interpreter Needed?: No Pre-visit prep was completed: yes AWV questionnaire completed by patient prior to visit?: yes Date:: 01/07/25 Living arrangements:: (!) (Patient-Rptd) lives alone Patient's Overall Health Status Rating: (Patient-Rptd) excellent Typical amount of pain: (Patient-Rptd) some Does pain affect daily life?: (Patient-Rptd) no Are you currently prescribed opioids?: no  Dietary Habits and Nutritional Risks How many meals a day?: (Patient-Rptd) 2 Eats fruit and vegetables daily?: (!) (Patient-Rptd) no Most meals are obtained by: (Patient-Rptd) preparing own meals In the last 2 weeks, have you had any of the following?: none Diabetic:: no  Functional Status Activities of Daily Living (to include ambulation/medication): (Patient-Rptd) Independent Ambulation: (Patient-Rptd) Independent Medication Administration: (Patient-Rptd) Independent Home Management (perform basic housework or laundry): (Patient-Rptd) Independent Manage your own finances?: (Patient-Rptd) yes Primary transportation is: (Patient-Rptd) driving Concerns about vision?: no  *vision screening is required for WTM* Concerns about hearing?: no  Fall Screening Falls in the past year?: (Patient-Rptd) 0 Number of falls in past year: 0 Was there an injury with Fall?: 0 Fall Risk Category Calculator: 0 Patient Fall Risk Level: Low Fall Risk  Fall Risk Patient at Risk for Falls Due to: No Fall Risks Fall risk Follow up: Falls evaluation completed; Education provided; Falls prevention discussed  Home and Transportation Safety: All rugs have non-skid backing?: (Patient-Rptd) yes All stairs or steps have railings?: (Patient-Rptd) yes Grab bars in the bathtub or shower?: (Patient-Rptd) yes Have non-skid surface in bathtub or shower?: (Patient-Rptd) yes Good home lighting?: (Patient-Rptd) yes Regular seat belt use?: (Patient-Rptd) yes Hospital stays in the last year:: (Patient-Rptd) no  Cognitive Assessment Difficulty concentrating, remembering, or making decisions? : (Patient-Rptd) no Will 6CIT or Mini Cog be Completed: no 6CIT or Mini Cog Declined: patient alert, oriented, able to answer questions appropriately and recall recent events  Advance Directives (For Healthcare) Does Patient Have a Medical Advance Directive?: No Would patient like information on creating a medical advance directive?: Yes (MAU/Ambulatory/Procedural Areas - Information given)  Reviewed/Updated  Reviewed/Updated: Reviewed All (Medical, Surgical, Family, Medications, Allergies, Care Teams, Patient Goals)    Allergies (verified) Patient has no known allergies.   Current Medications (verified) Outpatient Encounter Medications as of 01/10/2025  Medication Sig   Ascorbic Acid (VITAMIN C PO) Take 500 mg by mouth in the morning and at bedtime.   budesonide -formoterol  (SYMBICORT ) 80-4.5 MCG/ACT inhaler Inhale 2 puffs into the lungs 2 (two) times daily.   denosumab  (PROLIA ) 60 MG/ML SOSY injection Inject 60 mg into the skin every 6 (six) months.   dofetilide  (TIKOSYN ) 250 MCG capsule TAKE  1 CAPSULE BY MOUTH 2 TIMES DAILY.   Flaxseed, Linseed, (FLAXSEED OIL) 1200 MG CAPS Take 1,200 mg by mouth 2 (two) times daily.   metoprolol  tartrate (LOPRESSOR ) 25 MG tablet Take 1 tablet (25 mg  total) by mouth 2 (two) times daily as needed (Palpitations and elevated heart rate).   Multiple Vitamins-Minerals (MULTIVITAMIN WITH MINERALS) tablet Take 1 tablet by mouth daily.   potassium chloride  (KLOR-CON  M) 10 MEQ tablet Take 1 tablet (10 mEq total) by mouth daily.   Propylene Glycol (SYSTANE BALANCE) 0.6 % SOLN Place 2 drops into both eyes in the morning and at bedtime. Into both eyes   rosuvastatin  (CRESTOR ) 5 MG tablet Take 1 tablet (5 mg total) by mouth daily.   sertraline  (ZOLOFT ) 50 MG tablet 1 by mouth PO QD   XARELTO  15 MG TABS tablet TAKE ONE (1) TABLET BY MOUTH EVERY DAY   No facility-administered encounter medications on file as of 01/10/2025.    History: Past Medical History:  Diagnosis Date   Arrhythmia June 2021   Controlled   Atrial fibrillation/flutter    s/p RF ablation for AFlutter 02/2023   Depression 2008   GAD (generalized anxiety disorder)    Gastroparesis 04/2007   Dr. Ernesta   GERD (gastroesophageal reflux disease)    Hearing loss    Junctional rhythm    Osteoporosis    SVT (supraventricular tachycardia) 08/31/2023   Past Surgical History:  Procedure Laterality Date   A-FLUTTER ABLATION N/A 03/15/2023   Procedure: A-FLUTTER ABLATION;  Surgeon: Nancey Eulas BRAVO, MD;  Location: [facility];  Service: Cardiovascular;  Laterality: N/A;   COLONOSCOPY  05/20/2011   Dr Golda   COLONOSCOPY N/A 07/08/2021   Procedure: COLONOSCOPY;  Surgeon: Golda Claudis PENNER, MD;  Location: [facility];  Service: Endoscopy;  Laterality: N/A;  10:55   EYE SURGERY  2015   Cataract removal   TONSILLECTOMY     Family History  Problem Relation Age of Onset   Heart attack Mother    Arthritis Mother    Heart disease Mother    Heart disease Father    Ovarian cancer Sister     Cancer Sister    Hypertension Sister    Kidney disease Sister    Hypertension Sister    Hypertension Sister    Osteoporosis Sister    Coronary artery disease Brother    Depression Sister    Hypertension Sister    Heart disease Brother    Hypertension Brother    Social History   Occupational History   Not on file  Tobacco Use   Smoking status: Former    Current packs/day: 0.00    Average packs/day: 0.3 packs/day for 6.0 years (1.6 ttl pk-yrs)    Types: Cigarettes    Quit date: 12/20/1979    Years since quitting: 45.0   Smokeless tobacco: Never   Tobacco comments:    Former smoker 05/15/24  Vaping Use   Vaping status: Never Used  Substance and Sexual Activity   Alcohol use: Not Currently    Alcohol/week: 1.0 standard drink of alcohol    Types: 1 Glasses of wine per week    Comment: occ   Drug use: Never   Sexual activity: Not Currently    Birth control/protection: Post-menopausal   Tobacco Counseling Counseling given: Not Answered Tobacco comments: Former smoker 05/15/24  SDOH Screenings   Food Insecurity: No Food Insecurity (01/07/2025)  Housing: Low Risk (01/07/2025)  Transportation Needs: No Transportation Needs (01/07/2025)  Utilities: Not At Risk (01/10/2025)  Alcohol Screen: Low Risk (01/07/2025)  Depression (PHQ2-9): Low Risk (01/10/2025)  Financial Resource Strain: Low Risk (01/07/2025)  Physical Activity: Sufficiently Active (01/07/2025)  Social Connections: Moderately Integrated (01/07/2025)  Stress: No  Stress Concern Present (01/07/2025)  Tobacco Use: Medium Risk (01/10/2025)  Health Literacy: Adequate Health Literacy (01/10/2025)   See flowsheets for full screening details  Depression Screen PHQ 2 & 9 Depression Scale- Over the past 2 weeks, how often have you been bothered by any of the following problems? Little interest or pleasure in doing things: 0 Feeling down, depressed, or hopeless (PHQ Adolescent also includes...irritable): 0 PHQ-2 Total Score:  0 Trouble falling or staying asleep, or sleeping too much: 0 Feeling tired or having little energy: 1 Poor appetite or overeating (PHQ Adolescent also includes...weight loss): 0 Feeling bad about yourself - or that you are a failure or have let yourself or your family down: 0 Trouble concentrating on things, such as reading the newspaper or watching television (PHQ Adolescent also includes...like school work): 1 Moving or speaking so slowly that other people could have noticed. Or the opposite - being so fidgety or restless that you have been moving around a lot more than usual: 0 Thoughts that you would be better off dead, or of hurting yourself in some way: 0 PHQ-9 Total Score: 2 If you checked off any problems, how difficult have these problems made it for you to do your work, take care of things at home, or get along with other people?: Not difficult at all     Goals Addressed             This Visit's Progress    Maintain health and independence   On track            Objective:    Today's Vitals   01/10/25 1418  Weight: 107 lb (48.5 kg)  Height: 5' 4 (1.626 m)   Body mass index is 18.37 kg/m.  Hearing/Vision screen No results found. Immunizations and Health Maintenance Health Maintenance  Topic Date Due   COVID-19 Vaccine (6 - 2025-26 season) 08/19/2024   Mammogram  03/22/2025   Bone Density Scan  08/19/2025   Medicare Annual Wellness (AWV)  01/10/2026   DTaP/Tdap/Td (2 - Td or Tdap) 01/27/2033   Pneumococcal Vaccine: 50+ Years  Completed   Influenza Vaccine  Completed   Hepatitis C Screening  Completed   Zoster Vaccines- Shingrix  Completed   Meningococcal B Vaccine  Aged Out   Colonoscopy  Discontinued        Assessment/Plan:  This is a routine wellness examination for Jeanet.  Patient Care Team: Alphonsa Glendia LABOR, MD as PCP - General (Family Medicine) Alvan Dorn FALCON, MD as PCP - Cardiology (Cardiology) Mealor, Eulas BRAVO, MD as PCP -  Electrophysiology (Cardiology) Alphonsa Glendia LABOR, MD as Consulting Physician (Family Medicine) Tommas Pears, MD as Referring Physician (Endocrinology) Delane Lye, MD as Referring Physician (Family Medicine) Johnson, Laymon CHRISTELLA RIGGERS as Physician Assistant (Cardiology) [facility]  I have personally reviewed and noted the following in the patients chart:   Medical and social history Use of alcohol, tobacco or illicit drugs  Current medications and supplements including opioid prescriptions. Functional ability and status Nutritional status Physical activity Advanced directives List of other physicians Hospitalizations, surgeries, and ER visits in previous 12 months Vitals Screenings to include cognitive, depression, and falls Referrals and appointments  No orders of the defined types were placed in this encounter.  In addition, I have reviewed and discussed with patient certain preventive protocols, quality metrics, and best practice recommendations. A written personalized care plan for preventive services as well as general preventive health recommendations were provided to patient.  Lavelle Charmaine Browner, [place]   [id number]   Return in 1 year (on 01/10/2026).  After Visit Summary: (MyChart) Due to this being a telephonic visit, the after visit summary with patients personalized plan was offered to patient via MyChart   Nurse Notes: No voiced or noted concerns at this time Patient advised to keep follow-up appointment with PCP (02/03/25 with Elveria Quarry)  "

## 2025-01-21 ENCOUNTER — Other Ambulatory Visit (HOSPITAL_COMMUNITY): Payer: Self-pay | Admitting: Internal Medicine

## 2025-02-03 ENCOUNTER — Encounter: Admitting: Nurse Practitioner

## 2025-02-21 ENCOUNTER — Ambulatory Visit: Admitting: Cardiovascular Disease
